# Patient Record
Sex: Female | Born: 1980
Health system: Southern US, Community
[De-identification: ages and names within clinical notes are randomized; demographics above are authoritative.]

## PROBLEM LIST (undated history)

## (undated) ENCOUNTER — Inpatient Hospital Stay: Payer: Self-pay

## (undated) DIAGNOSIS — F32A Depression, unspecified: Secondary | ICD-10-CM

## (undated) DIAGNOSIS — Z9889 Other specified postprocedural states: Secondary | ICD-10-CM

## (undated) DIAGNOSIS — T4145XA Adverse effect of unspecified anesthetic, initial encounter: Secondary | ICD-10-CM

## (undated) DIAGNOSIS — T8859XA Other complications of anesthesia, initial encounter: Secondary | ICD-10-CM

## (undated) DIAGNOSIS — J45909 Unspecified asthma, uncomplicated: Secondary | ICD-10-CM

## (undated) DIAGNOSIS — N915 Oligomenorrhea, unspecified: Secondary | ICD-10-CM

## (undated) DIAGNOSIS — F502 Bulimia nervosa: Secondary | ICD-10-CM

## (undated) DIAGNOSIS — L309 Dermatitis, unspecified: Secondary | ICD-10-CM

## (undated) DIAGNOSIS — R51 Headache: Secondary | ICD-10-CM

## (undated) DIAGNOSIS — O09892 Supervision of other high risk pregnancies, second trimester: Principal | ICD-10-CM

## (undated) DIAGNOSIS — D649 Anemia, unspecified: Secondary | ICD-10-CM

## (undated) DIAGNOSIS — R519 Headache, unspecified: Secondary | ICD-10-CM

## (undated) DIAGNOSIS — E282 Polycystic ovarian syndrome: Secondary | ICD-10-CM

## (undated) DIAGNOSIS — G500 Trigeminal neuralgia: Secondary | ICD-10-CM

## (undated) DIAGNOSIS — O339 Maternal care for disproportion, unspecified: Secondary | ICD-10-CM

## (undated) DIAGNOSIS — F909 Attention-deficit hyperactivity disorder, unspecified type: Secondary | ICD-10-CM

## (undated) DIAGNOSIS — E039 Hypothyroidism, unspecified: Secondary | ICD-10-CM

## (undated) DIAGNOSIS — R110 Nausea: Secondary | ICD-10-CM

## (undated) DIAGNOSIS — Z141 Cystic fibrosis carrier: Principal | ICD-10-CM

## (undated) DIAGNOSIS — R112 Nausea with vomiting, unspecified: Secondary | ICD-10-CM

## (undated) DIAGNOSIS — K219 Gastro-esophageal reflux disease without esophagitis: Secondary | ICD-10-CM

## (undated) DIAGNOSIS — J309 Allergic rhinitis, unspecified: Secondary | ICD-10-CM

## (undated) DIAGNOSIS — F329 Major depressive disorder, single episode, unspecified: Secondary | ICD-10-CM

## (undated) DIAGNOSIS — Z113 Encounter for screening for infections with a predominantly sexual mode of transmission: Secondary | ICD-10-CM

## (undated) DIAGNOSIS — Z1151 Encounter for screening for human papillomavirus (HPV): Secondary | ICD-10-CM

## (undated) DIAGNOSIS — N760 Acute vaginitis: Secondary | ICD-10-CM

## (undated) DIAGNOSIS — N63 Unspecified lump in unspecified breast: Secondary | ICD-10-CM

## (undated) DIAGNOSIS — Z01419 Encounter for gynecological examination (general) (routine) without abnormal findings: Secondary | ICD-10-CM

## (undated) HISTORY — DX: Hypothyroidism, unspecified: E03.9

## (undated) HISTORY — DX: Bulimia nervosa: F50.2

## (undated) HISTORY — DX: Maternal care for disproportion, unspecified: O33.9

## (undated) HISTORY — DX: Polycystic ovarian syndrome: E28.2

## (undated) HISTORY — DX: Oligomenorrhea, unspecified: N91.5

## (undated) HISTORY — PX: TONSILLECTOMY: SUR1361

## (undated) HISTORY — PX: WISDOM TOOTH EXTRACTION: SHX21

---

## 2003-05-26 ENCOUNTER — Emergency Department (HOSPITAL_COMMUNITY): Admission: EM | Admit: 2003-05-26 | Discharge: 2003-05-27 | Payer: Self-pay | Admitting: Emergency Medicine

## 2006-06-14 ENCOUNTER — Ambulatory Visit: Payer: Self-pay | Admitting: Family Medicine

## 2006-06-28 ENCOUNTER — Encounter: Payer: Self-pay | Admitting: Family Medicine

## 2006-06-28 ENCOUNTER — Ambulatory Visit: Payer: Self-pay | Admitting: Family Medicine

## 2006-06-28 ENCOUNTER — Other Ambulatory Visit: Admission: RE | Admit: 2006-06-28 | Discharge: 2006-06-28 | Payer: Self-pay | Admitting: Family Medicine

## 2006-06-28 LAB — CONVERTED CEMR LAB
HCV Ab: NEGATIVE
Pap Smear: NORMAL

## 2007-12-27 ENCOUNTER — Ambulatory Visit: Payer: Self-pay | Admitting: Family Medicine

## 2007-12-27 DIAGNOSIS — N63 Unspecified lump in unspecified breast: Secondary | ICD-10-CM | POA: Insufficient documentation

## 2007-12-27 DIAGNOSIS — N644 Mastodynia: Secondary | ICD-10-CM | POA: Insufficient documentation

## 2007-12-27 DIAGNOSIS — N6459 Other signs and symptoms in breast: Secondary | ICD-10-CM | POA: Insufficient documentation

## 2007-12-30 ENCOUNTER — Encounter: Admission: RE | Admit: 2007-12-30 | Discharge: 2007-12-30 | Payer: Self-pay | Admitting: Family Medicine

## 2007-12-31 ENCOUNTER — Telehealth: Payer: Self-pay | Admitting: Family Medicine

## 2007-12-31 LAB — CONVERTED CEMR LAB
Basophils Absolute: 0.1 10*3/uL (ref 0.0–0.1)
Basophils Relative: 1 % (ref 0–1)
Eosinophils Absolute: 0.2 10*3/uL (ref 0.0–0.7)
Eosinophils Relative: 2 % (ref 0–5)
HCT: 38.7 % (ref 36.0–46.0)
Hemoglobin: 13.5 g/dL (ref 12.0–15.0)
Lymphocytes Relative: 26 % (ref 12–46)
Lymphs Abs: 2.5 10*3/uL (ref 0.7–4.0)
MCHC: 34.9 g/dL (ref 30.0–36.0)
MCV: 87.4 fL (ref 78.0–100.0)
Monocytes Absolute: 0.5 10*3/uL (ref 0.1–1.0)
Monocytes Relative: 5 % (ref 3–12)
Neutro Abs: 6.4 10*3/uL (ref 1.7–7.7)
Neutrophils Relative %: 67 % (ref 43–77)
Platelets: 310 10*3/uL (ref 150–400)
Prolactin: 43.4 ng/mL
RBC: 4.43 M/uL (ref 3.87–5.11)
RDW: 12.1 % (ref 11.5–15.5)
TSH: 4.511 microintl units/mL — ABNORMAL HIGH (ref 0.350–4.50)
WBC: 9.7 10*3/uL (ref 4.0–10.5)
hCG, Beta Chain, Quant, S: <2 milliintl units/mL

## 2008-01-17 ENCOUNTER — Encounter: Payer: Self-pay | Admitting: Family Medicine

## 2008-01-17 DIAGNOSIS — F502 Bulimia nervosa, unspecified: Secondary | ICD-10-CM

## 2008-01-17 DIAGNOSIS — F3289 Other specified depressive episodes: Secondary | ICD-10-CM | POA: Insufficient documentation

## 2008-01-17 DIAGNOSIS — J309 Allergic rhinitis, unspecified: Secondary | ICD-10-CM | POA: Insufficient documentation

## 2008-01-17 DIAGNOSIS — J45909 Unspecified asthma, uncomplicated: Secondary | ICD-10-CM | POA: Insufficient documentation

## 2008-01-17 DIAGNOSIS — F329 Major depressive disorder, single episode, unspecified: Secondary | ICD-10-CM | POA: Insufficient documentation

## 2008-01-17 DIAGNOSIS — L259 Unspecified contact dermatitis, unspecified cause: Secondary | ICD-10-CM | POA: Insufficient documentation

## 2008-01-17 DIAGNOSIS — G43009 Migraine without aura, not intractable, without status migrainosus: Secondary | ICD-10-CM | POA: Insufficient documentation

## 2008-01-17 HISTORY — DX: Bulimia nervosa: F50.2

## 2008-01-17 HISTORY — DX: Bulimia nervosa, unspecified: F50.20

## 2010-10-14 NOTE — Assessment & Plan Note (Signed)
[facility]                           [facility] OFFICE NOTE   NAME:Paula [name]                     MRN:          [id number]  DATE:06/14/2006                            DOB:          Aug 24, 1980    CHIEF COMPLAINT:  A 30 year old female here to establish new doctor.   HISTORY OF PRESENT ILLNESS:  [name] was encouraged by her friend  who is also my patient to come into clinic to get routine prevention up  to date. She is due for her Pap smear. She also has the following  concerns:  1. Birth control options:  She was on Yasmin in the past and noticed      some blood clots, but has not been on this in over a year. She is      interested in restarting a type of birth control.  2. Chronic congestion: She states that off and on for the past 2 years      she has had problems with congestion, intermittent facial      tenderness, sneezing, mucous, and occasionally chills. No fever.      She has some worsening of symptoms over the past 2 weeks. She      denies cough, shortness of breath.  3. Frequent mood swings and anger problems: She states that her mood      swings very quickly within the day, she can be happy and then be      very angry. She has difficulty managing her anger and is very      irritable. She denies sad feelings. She does have a family history      that is fairly strong for depression and bipolar disorder. She has      never been evaluated for these. She denies suicidal or homicidal      ideation.   REVIEW OF SYSTEMS:  Otherwise negative.   PAST MEDICAL HISTORY:  1. Asthma, mild, intermittent.  2. Depression.  3. Bulimia at age 38, now resolved.  4. Eczema.  5. Allergic rhinitis.  6. Migraines.  7. Herpes simplex type 1.   HOSPITALIZATIONS, SURGERIES, PROCEDURES:  1. 2001, barium swallow negative.  2. 1992, tonsillectomy.  3. August 2004 ovarian cyst rupture.  4. Pap smear negative in 2006.   ALLERGIES:  TO IV DYE  CAUSING DIFFICULTY BREATHING.   MEDICATIONS:  Multivitamin daily.   SOCIAL HISTORY:  No tobacco use, occasional alcohol 3 to 4 drinks every  few weeks. No drug use. 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 She is single currently but has had multiple sex  partners. She is not currently sexually active but does use condoms when  she is. She has a history of herpes and Chlamydia. She walks at work but  does not get any regular exercise. She eats frequent salads but not much  fruit and does eat vegetables.   FAMILY HISTORY:  Father deceased at age 29 from pneumonia, mother alive  at age 71 with fibromyalgia, bipolar disorder, and depression. She does  not have a good relationship with  her mother. She was raised by her  grandparents. Paternal grandmother had a heart attack and a stroke  before age 45. She has 1 brother who has PTSD and bipolar disorder. Her  maternal grandmother possibly plus other family members have diabetes.  Great grandmother had colon cancer.   PHYSICAL EXAMINATION:  VITAL SIGNS:  Height 58 inches, weight 117, blood  pressure 128/82, pulse 80, temperature 98.2.  GENERAL: Healthy-appearing female in no apparent distress.  PSYCH: Appropriate affect; no suicidal, homicidal ideation. Clear  thinking.  HEENT: PERRLA: Extraocular muscles intact.  Oropharynx clear.  Tympanic  membranes clear. Nares clear but with turbinates, pale. No thyromegaly.  No lymphadenopathy, supraclavicular, cervical. No sinus tenderness.  LUNGS: Clear to auscultation bilaterally; no wheezes, rales, or rhonchi.  ABDOMEN: Soft, nontender, normoactive bowel sounds, no  hepatosplenomegaly.  MUSCULOSKELETAL: Strength 5/5 in upper and lower extremities.  NEURO: Cranial nerves II-XII grossly intact, alert and oriented x3.   ASSESSMENT/PLAN:  1. Multiple types of birth control where discussed with the patient in      detail. She will begin on Seasonale which will be called in  via Dr.      Clinton Gallant.  2. Allergic rhinitis, chronic: She states that she has not tolerated      nasal sprays in the past. She wants to avoid that if possible. She      was given 3 samples of Allegra D which she will use over the next      few days and then we will change over to a prescription for      fexofenadine 1 tablet daily.  3. Mood swings and anger management problems: We did not have time to      discuss this in detail today. At her next visit we can do a      depression and bipolar screen as well as make a referal for anger      management and stress management.  4. Prevention:  She is due for Pap smear which she will return for.      She is up to date with vaccines. We discussed healthy eating habits      and how to increase exercise. I will obtain records from her      previous doctors. She was also encouraged to take calcium and      vitamin D along with multivitamin daily.     Kerby Nora, MD  Electronically Signed    AB/MedQ  DD: 06/14/2006  DT: 06/14/2006  Job #: [id number]

## 2011-01-03 ENCOUNTER — Other Ambulatory Visit: Payer: Self-pay

## 2011-01-26 ENCOUNTER — Ambulatory Visit: Payer: Self-pay | Admitting: Vascular Surgery

## 2011-03-01 ENCOUNTER — Other Ambulatory Visit: Payer: Self-pay | Admitting: Obstetrics and Gynecology

## 2011-04-21 ENCOUNTER — Other Ambulatory Visit: Payer: Self-pay | Admitting: Emergency Medicine

## 2011-06-27 ENCOUNTER — Other Ambulatory Visit: Payer: Self-pay

## 2011-06-27 LAB — TSH: Thyroid Stimulating Horm: 4.58 u[IU]/mL — ABNORMAL HIGH

## 2011-07-13 ENCOUNTER — Emergency Department: Payer: Self-pay | Admitting: Emergency Medicine

## 2011-07-13 LAB — URINALYSIS, COMPLETE
Bacteria: NONE SEEN
Bilirubin,UR: NEGATIVE
Blood: NEGATIVE
Glucose,UR: NEGATIVE mg/dL (ref 0–75)
Leukocyte Esterase: NEGATIVE
Nitrite: NEGATIVE
Ph: 6 (ref 4.5–8.0)
Protein: NEGATIVE
RBC,UR: 1 /HPF (ref 0–5)
Specific Gravity: 1.025 (ref 1.003–1.030)
Squamous Epithelial: 1
WBC UR: 1 /HPF (ref 0–5)

## 2011-07-13 LAB — RAPID INFLUENZA A&B ANTIGENS

## 2011-07-16 LAB — BETA STREP CULTURE(ARMC)

## 2011-08-02 ENCOUNTER — Other Ambulatory Visit: Payer: Self-pay | Admitting: Obstetrics and Gynecology

## 2011-08-02 LAB — PREGNANCY, URINE: Pregnancy Test, Urine: NEGATIVE m[IU]/mL

## 2011-09-25 ENCOUNTER — Other Ambulatory Visit: Payer: Self-pay

## 2011-09-25 LAB — TSH: Thyroid Stimulating Horm: 0.8 u[IU]/mL

## 2011-09-25 LAB — LIPID PANEL
Cholesterol: 170 mg/dL (ref 0–200)
HDL Cholesterol: 40 mg/dL (ref 40–60)
Ldl Cholesterol, Calc: 113 mg/dL — ABNORMAL HIGH (ref 0–100)
Triglycerides: 85 mg/dL (ref 0–200)
VLDL Cholesterol, Calc: 17 mg/dL (ref 5–40)

## 2011-10-21 LAB — VAGINITIS DNA PROBE
Direct Exam: NEGATIVE
Direct Exam: NEGATIVE
Direct Exam: NEGATIVE

## 2011-10-21 LAB — MICROSCOPIC URINALYSIS
RBC, UA: 0 /HPF
WBC, UA: 50 /HPF

## 2011-10-21 LAB — UA W/REFLEX CULTURE
Bilirubin Urine: NEGATIVE
Glucose, Ur: NEGATIVE
Ketones, Urine: NEGATIVE
Nitrite, Urine: NEGATIVE
Protein, UA: NEGATIVE
Specific Gravity, UA: 1.01 (ref 1.000–1.030)
Urine Hgb: NEGATIVE
Urobilinogen, Urine: NORMAL
pH, UA: 6 (ref 5.0–8.0)

## 2011-10-21 LAB — PREGNANCY, URINE: HCG(Urine) Pregnancy Test: NEGATIVE

## 2011-10-22 LAB — URINE CULTURE CLEAN CATCH

## 2011-10-22 LAB — C. TRACHOMATIS / N. GONORRHOEAE, DNA
C. trachomatis DNA: NEGATIVE
N. gonorrhoeae DNA: NEGATIVE

## 2012-01-08 ENCOUNTER — Other Ambulatory Visit: Payer: Self-pay | Admitting: General Practice

## 2012-01-08 LAB — TSH: Thyroid Stimulating Horm: 1.54 u[IU]/mL

## 2012-02-26 ENCOUNTER — Other Ambulatory Visit: Payer: Self-pay

## 2012-02-26 LAB — T4, FREE: Free Thyroxine: 0.69 ng/dL — ABNORMAL LOW (ref 0.76–1.46)

## 2012-02-26 LAB — TSH: Thyroid Stimulating Horm: 0.628 u[IU]/mL

## 2012-04-12 ENCOUNTER — Other Ambulatory Visit: Payer: Self-pay | Admitting: Obstetrics and Gynecology

## 2012-06-10 ENCOUNTER — Other Ambulatory Visit: Payer: Self-pay | Admitting: Obstetrics and Gynecology

## 2013-03-28 ENCOUNTER — Observation Stay: Payer: Self-pay | Admitting: Obstetrics and Gynecology

## 2013-03-28 LAB — URINALYSIS, COMPLETE
Bilirubin,UR: NEGATIVE
Blood: NEGATIVE
Glucose,UR: NEGATIVE mg/dL (ref 0–75)
Leukocyte Esterase: NEGATIVE
Nitrite: NEGATIVE
Ph: 5 (ref 4.5–8.0)
Protein: 30
RBC,UR: 4 /HPF (ref 0–5)
Specific Gravity: 1.027 (ref 1.003–1.030)
Squamous Epithelial: 1
WBC UR: 4 /HPF (ref 0–5)

## 2013-04-19 ENCOUNTER — Inpatient Hospital Stay: Payer: Self-pay | Admitting: Obstetrics and Gynecology

## 2013-04-19 LAB — CBC WITH DIFFERENTIAL/PLATELET
Basophil #: 0 10*3/uL (ref 0.0–0.1)
Basophil %: 0.3 %
Eosinophil #: 0 10*3/uL (ref 0.0–0.7)
Eosinophil %: 0.3 %
HCT: 32 % — ABNORMAL LOW (ref 35.0–47.0)
HGB: 10.7 g/dL — ABNORMAL LOW (ref 12.0–16.0)
Lymphocyte #: 1.5 10*3/uL (ref 1.0–3.6)
Lymphocyte %: 9.7 %
MCH: 28.7 pg (ref 26.0–34.0)
MCHC: 33.5 g/dL (ref 32.0–36.0)
MCV: 86 fL (ref 80–100)
Monocyte #: 0.7 x10 3/mm (ref 0.2–0.9)
Monocyte %: 4.8 %
Neutrophil #: 12.8 10*3/uL — ABNORMAL HIGH (ref 1.4–6.5)
Neutrophil %: 84.9 %
Platelet: 237 10*3/uL (ref 150–440)
RBC: 3.73 10*6/uL — ABNORMAL LOW (ref 3.80–5.20)
RDW: 13.5 % (ref 11.5–14.5)
WBC: 15 10*3/uL — ABNORMAL HIGH (ref 3.6–11.0)

## 2013-04-20 LAB — HEMATOCRIT: HCT: 28.8 % — ABNORMAL LOW (ref 35.0–47.0)

## 2013-06-03 ENCOUNTER — Other Ambulatory Visit: Payer: Self-pay | Admitting: Obstetrics and Gynecology

## 2013-06-03 LAB — RENAL FUNCTION PANEL
Albumin: 3.6 g/dL (ref 3.4–5.0)
Anion Gap: 4 — ABNORMAL LOW (ref 7–16)
BUN: 13 mg/dL (ref 7–18)
Calcium, Total: 8.9 mg/dL (ref 8.5–10.1)
Chloride: 104 mmol/L (ref 98–107)
Co2: 28 mmol/L (ref 21–32)
Creatinine: 0.72 mg/dL (ref 0.60–1.30)
EGFR (African American): >60
EGFR (Non-African Amer.): >60
Glucose: 72 mg/dL (ref 65–99)
Osmolality: 271 (ref 275–301)
Phosphorus: 3.6 mg/dL (ref 2.5–4.9)
Potassium: 3.9 mmol/L (ref 3.5–5.1)
Sodium: 136 mmol/L (ref 136–145)

## 2013-06-03 LAB — CBC WITH DIFFERENTIAL/PLATELET
Basophil #: 0.1 10*3/uL (ref 0.0–0.1)
Basophil %: 1.1 %
Eosinophil #: 0.2 10*3/uL (ref 0.0–0.7)
Eosinophil %: 3.2 %
HCT: 39.5 % (ref 35.0–47.0)
HGB: 13.3 g/dL (ref 12.0–16.0)
Lymphocyte #: 2 10*3/uL (ref 1.0–3.6)
Lymphocyte %: 34.6 %
MCH: 27.7 pg (ref 26.0–34.0)
MCHC: 33.6 g/dL (ref 32.0–36.0)
MCV: 83 fL (ref 80–100)
Monocyte #: 0.3 x10 3/mm (ref 0.2–0.9)
Monocyte %: 4.8 %
Neutrophil #: 3.2 10*3/uL (ref 1.4–6.5)
Neutrophil %: 56.3 %
Platelet: 264 10*3/uL (ref 150–440)
RBC: 4.79 10*6/uL (ref 3.80–5.20)
RDW: 14.3 % (ref 11.5–14.5)
WBC: 5.7 10*3/uL (ref 3.6–11.0)

## 2013-06-03 LAB — TSH: Thyroid Stimulating Horm: 1.14 u[IU]/mL

## 2013-08-15 NOTE — Progress Notes (Signed)
Patient wasn't able to receive pap due to being on her period during her visit.  This encounter was created in error - please disregard.

## 2013-08-19 MED ORDER — METRONIDAZOLE 500 MG PO TABS
500 MG | ORAL_TABLET | Freq: Two times a day (BID) | ORAL | Status: AC
Start: 2013-08-19 — End: 2013-08-26

## 2013-08-19 NOTE — Progress Notes (Signed)
Subjective:      Patient ID: Olivia Castro is a 33 y.o. female.  Chief Complaint   Patient presents with   ??? Gynecologic Exam     BP 124/78    Ht 5\' 4"  (1.626 m)    Wt 149 lb (67.586 kg)    BMI 25.56 kg/m2      LMP 08/10/2013      Breastfeeding? No     Patient's last menstrual period was 08/10/2013.    [id number]    Not found.  History reviewed. No pertinent past medical history.  No current Epic-ordered outpatient prescriptions on file.     No current Epic-ordered facility-administered medications on file.     Problem List Items Addressed This Visit     None      Visit Diagnoses    Pap smear for cervical cancer screening    -  Primary         No Known Allergies  No orders of the defined types were placed in this encounter.             HPI Comments: Patient is here for her annual pap smear. She denies being in any pain. She currently is not on any type of birth control and doesn't want any.    Gynecologic Exam  The patient's pertinent negatives include no pelvic pain. Pertinent negatives include no abdominal pain, constipation, diarrhea or dysuria.   denies any bleeding or fever at this time    Review of Systems   Constitutional: Negative for activity change and appetite change.   HENT: Negative for ear discharge.    Eyes: Negative for visual disturbance.   Respiratory: Negative for apnea and cough.    Cardiovascular: Negative for chest pain, palpitations and leg swelling.   Gastrointestinal: Negative for abdominal pain, diarrhea, constipation and abdominal distention.   Genitourinary: Negative for dysuria, difficulty urinating, menstrual problem and pelvic pain.   Musculoskeletal: Negative for neck pain and neck stiffness.   Neurological: Negative for light-headedness and numbness.   Hematological: Negative.  Does not bruise/bleed easily.         Objective:   Physical Exam   Constitutional: She is oriented to person, place, and time. She appears well-developed and well-nourished.   HENT:   Head: Normocephalic  and atraumatic.   Neck: Normal range of motion. Neck supple. No thyromegaly present.   Cardiovascular: Normal rate and regular rhythm.    Pulmonary/Chest: Effort normal and breath sounds normal. She has no wheezes.   Abdominal: Soft. Bowel sounds are normal. She exhibits no distension and no mass. There is no tenderness. There is no guarding. Hernia confirmed negative in the right inguinal area and confirmed negative in the left inguinal area.   Genitourinary: Rectum normal and uterus normal. No breast swelling, tenderness, discharge or bleeding. There is no rash or tenderness on the right labia. There is no rash or tenderness on the left labia. Cervix exhibits discharge. Cervix exhibits no motion tenderness and no friability. Right adnexum displays no mass, no tenderness and no fullness. Left adnexum displays no mass, no tenderness and no fullness. Vaginal discharge found.   Musculoskeletal: Normal range of motion.   Lymphadenopathy:        Right: No inguinal adenopathy present.        Left: No inguinal adenopathy present.   Neurological: She is alert and oriented to person, place, and time.   Skin: Skin is dry.   Psychiatric: She has a normal mood and affect.  Her behavior is normal. Thought content normal.   Nursing note and vitals reviewed.        Assessment:      Pt with vaginitis otherwise normal annual exam      Plan:      Orders Placed This Encounter   Procedures   ??? PAP SMEAR     Orders Placed This Encounter   Medications   ??? metroNIDAZOLE (FLAGYL) 500 MG tablet     Sig: Take 1 tablet by mouth 2 times daily for 7 days.     Dispense:  14 tablet     Refill:  0

## 2013-12-18 LAB — TSH: TSH: 1.29 u[IU]/mL (ref <=5.90)

## 2013-12-18 LAB — HM PAP SMEAR: HM Pap smear: NEGATIVE

## 2014-02-24 NOTE — Progress Notes (Signed)
Subjective:      Patient ID: Olivia Castro is a [age] y.o. female.  Chief Complaint   Patient presents with   ??? Breast Mass     right      BP 116/68 mmHg   Pulse 84   Resp 14   Ht 5\' 4"  (1.626 m)   Wt 159 lb (72.122 kg)   BMI 27.28 kg/m2   LMP 01/27/2014 (Within Weeks)   Breastfeeding? No  Patient's last menstrual period was 01/27/2014 (within weeks).    [id number]    No past medical history on file.  No current Epic-ordered outpatient prescriptions on file.     No current Epic-ordered facility-administered medications on file.     Problem List Items Addressed This Visit     None        No Known Allergies  No orders of the defined types were placed in this encounter.           HPI Comments: Pt found a lump on her right breast.  Pt says that it's been there for approx a month.  This lump is causing her pain rated a 2.  Pt says that her breast is tender.  No medication taken for pain.  Pt says there is not a history of breast cancer in her family that she is aware of.    Denies fever, nausea or vomiting associated to breast lump  Review of Systems   Constitutional: Negative for activity change and appetite change.   HENT: Negative for ear discharge.    Eyes: Negative for visual disturbance.   Respiratory: Negative for apnea and cough.    Cardiovascular: Negative for chest pain, palpitations and leg swelling.   Gastrointestinal: Negative for abdominal pain, diarrhea, constipation and abdominal distention.   Genitourinary: Negative for dysuria, difficulty urinating, menstrual problem and pelvic pain.   Musculoskeletal: Negative for neck pain and neck stiffness.   Neurological: Negative for light-headedness and numbness.   Hematological: Negative.  Does not bruise/bleed easily.         Objective:   Physical Exam   Constitutional: She is oriented to person, place, and time. Vital signs are normal. She appears well-developed and well-nourished.   HENT:   Head: Normocephalic and atraumatic.   Neck: Normal range of motion.  Neck supple. No thyromegaly present.   Cardiovascular: Normal rate and regular rhythm.    Pulmonary/Chest: Effort normal and breath sounds normal. She has no wheezes. Right breast exhibits mass and tenderness. Right breast exhibits no inverted nipple, no nipple discharge and no skin change. Left breast exhibits no inverted nipple, no mass, no nipple discharge, no skin change and no tenderness. Breasts are symmetrical.   Abdominal: Soft. Bowel sounds are normal. She exhibits no distension and no mass. There is no tenderness. There is no guarding.   Genitourinary: Uterus normal.   Musculoskeletal: Normal range of motion.   Neurological: She is alert and oriented to person, place, and time.   Skin: Skin is dry.   Psychiatric: She has a normal mood and affect. Her behavior is normal. Thought content normal.   Nursing note and vitals reviewed.        Assessment:      Pt with rt breast mass       Plan:      Orders Placed This Encounter   Procedures   ??? Ultrasound breast bilateral

## 2014-02-27 ENCOUNTER — Telehealth

## 2014-02-27 NOTE — Telephone Encounter (Signed)
Patient [facility] bilateral [facility], and radiologist is requesting a bilateral mammogram order for further care.

## 2014-03-13 NOTE — Telephone Encounter (Signed)
-----   Message from Erline Levinelaudio E Linares, MD sent at 03/10/2014  4:58 PM EDT -----  Have pt see me

## 2014-03-16 NOTE — Telephone Encounter (Signed)
-----   Message from Claudio E Linares, MD sent at 03/10/2014  4:58 PM EDT -----  Have pt see me

## 2014-03-23 NOTE — Telephone Encounter (Signed)
Dr Lucita Loralinares wants to see pt

## 2014-03-23 NOTE — Telephone Encounter (Signed)
Previous encounter opened for this contact

## 2014-04-01 NOTE — Telephone Encounter (Signed)
Dr Lucita Loralinares wants to see pt she was a n/s for her follow up appt

## 2014-05-04 NOTE — Telephone Encounter (Signed)
Pt needs appt to discuss mammogram

## 2014-05-04 NOTE — Telephone Encounter (Signed)
-----   Message from Claudio E Linares, MD sent at 03/10/2014  4:58 PM EDT -----  Have pt see me

## 2014-05-28 NOTE — Telephone Encounter (Signed)
Addressing in new encounter

## 2014-05-28 NOTE — Telephone Encounter (Signed)
Addressing in new encounter

## 2014-05-29 DIAGNOSIS — Z141 Cystic fibrosis carrier: Secondary | ICD-10-CM

## 2014-05-29 DIAGNOSIS — O09892 Supervision of other high risk pregnancies, second trimester: Secondary | ICD-10-CM

## 2014-05-29 HISTORY — DX: Supervision of other high risk pregnancies, second trimester: O09.892

## 2014-05-29 HISTORY — DX: Cystic fibrosis carrier: Z14.1

## 2014-06-24 NOTE — Telephone Encounter (Signed)
L/M for patient to call office back re: her mammogram .

## 2014-08-18 NOTE — Telephone Encounter (Signed)
Unable to contact pt

## 2014-09-03 ENCOUNTER — Other Ambulatory Visit
Admit: 2014-09-03 | Disposition: A | Discharge status: Home / Self Care | Payer: Self-pay | Attending: Otolaryngology | Admitting: Otolaryngology

## 2014-09-03 LAB — TSH: Thyroid Stimulating Horm: 2.79 u[IU]/mL

## 2014-09-03 LAB — T4, FREE: Free Thyroxine: 0.75 ng/dL

## 2014-09-18 NOTE — Op Note (Signed)
PATIENT NAME:  Paula [name], Paula [name] MR#:  [id number] DATE OF BIRTH:  [birth year]  DATE OF PROCEDURE:  04/19/2013  PREOPERATIVE DIAGNOSES: 1.  A 38.5 week intrauterine pregnancy, delivered.  2.  Suspected CPD.   POSTOPERATIVE DIAGNOSES: 1.  A 38.5 week intrauterine pregnancy, delivered.  2.  Suspected CPD.  3.  Nuchal cord x 1.  4.  Viable female infant, 7 pounds, 6 ounces.   OPERATIVE PROCEDURE: Primary low cervical transverse cesarean section.   SURGEON: Herold HarmsMartin A Shanisha Lech, MD   FIRST ASSISTANT:  Maryelizabeth RowanSarah Denon,  PA-S  ANESTHESIA: Spinal.   INDICATIONS: The patient is a 34 year old white female, gravida 1, para zero, at 38.[redacted] weeks gestation, who presents in labor. The patient has small stature and a clinically suspect pelvis with clinical CPD.  Fetal head was not descended into the pelvis. Due to the regularity of the contractions, effacement without dilation or engagement of the fetal head, decision was made to proceed with cesarean section delivery.   Findings at surgery revealed a viable female infant, 7 pounds, 6 ounces, having Apgars of 8 and 9 at one and five minutes, respectively. The infant had a nuchal cord x 1 which was reduced. Uterus, tubes, and ovaries were grossly normal.   DESCRIPTION OF PROCEDURE: The patient was brought to the Operating Room, where she was placed in a sitting position. Spinal anesthetic was introduced without difficulty. She was placed in the supine position with a right lateral hip roll placed. A ChloraPrep abdominal and perineal prep and drape was performed in standard fashion. A Foley catheter was placed, which was draining clear yellow urine. After checking for adequate level of anesthesia, a Pfannenstiel incision was made into the abdomen. Fascia was incised transversely and extended bilaterally with Mayo scissors. Midline raphe was incised, separated, and the peritoneum was entered. Bladder flap was created through sharp dissection. A transverse incision  was made in the uterus and this was extended both cephalad and caudad through standard technique. The infant was delivered through a vertex presentation, and was vigorous at birth. Nuchal cord x 1 was reduced. Oropharynx and nasopharynx were bulb suctioned on the abdominal wall. The infant was delivered in standard fashion. The umbilical cord was doubly clamped and cut, and the infant was handed off to the awaiting resuscitation team. Cord blood sampling was obtained. Placenta was expressed from the uterine cavity. The uterus was externalized onto the anterior abdominal wall, and was cleared of all debris with laps. The incision was closed in 2 layers using #1 chromic suture. First layer was a running locking stitch, second layer was an imbricating layer. Anatomy was noted to be normal, and the uterus was placed back into the abdominopelvic cavity. Gutters were cleared of all debris with laps. The incision was closed in 2 layers using 0 Maxon on the fascia in a simple running manner. The skin was closed with subcuticular stitch of 4-0 Vicryl. Steri-Strips were placed over the incision. The patient was then mobilized, and taken to the recovery room in satisfactory condition.   Estimated blood loss was 500 mL. Intravenous fluids were 1200 mL of crystalloid. Urine output was 150 mL. All instrument, needle, and sponge counts were verified as correct. The patient did receive Ancef antibiotic prophylaxis.     ____________________________ Prentice DockerMartin A. Marlys Stegmaier, MD mad:mr D: 04/19/2013 19:07:47 ET T: 04/19/2013 21:45:44 ET JOB#: [id number]  cc: Daphine DeutscherMartin A. Suni Jarnagin, MD, <Dictator> Prentice DockerMARTIN A Tykira Wachs MD ELECTRONICALLY SIGNED 05/01/2013 7:40

## 2014-10-06 NOTE — H&P (Signed)
L&D Evaluation:  History:  HPI 34 yo G1P0 at 5938.[redacted] weeks gestation, estimated date of confinement 04/28/2013 admitted in Labor. Pt with clinical CPD. C/S desired.   Presents with contractions, decreased fetal movement, nausea/vomiting   Patient's Medical History Short Stature; Hypothyroidism   Patient's Surgical History none   Medications Pre [place]atal Vitamins  Levothyroxin 75mcg   Allergies Shellfish, Iodine   Social History none   Family History Non-Contributory   ROS:  General normal   HEENT normal   CNS normal   GI nausea   GU normal   Resp normal   CV normal   Renal normal   MS normal   Exam:  Vital Signs stable   Urine Protein not completed   General Uncomfortable with contyractions   Mental Status clear   Chest clear   Heart normal sinus rhythm   Abdomen gravid, tender with contractions   Estimated Fetal Weight Average for gestational age   Fetal Position Vtx   Back no CVAT   Edema 1+   Pelvic no external lesions, 3/80/-4/posterior/vtx   Mebranes Intact   FHT normal rate with no decels   Ucx regular   Ucx Frequency 4 min   Ucx Pain Scale moderate   Skin dry   Lymph no lymphadenopathy   Other O+/ATB-/NR/RI/VI/HB-/HIV-GBS-/glucola 120/TSH 3.13 (02/27/13)   Impression:  Impression early labor, decreased fetal movement, Nausea; Clinical CPD   Plan:  Plan Primary Low Transverse Cesarean Section   Comments Counseling: Pt understands the recommendation for C/S delivery. Her short stature and clinical pelvimetry(marginal), lack of fetal head descent into pelvis contributes to her CPD diagnosis. Strong contraction over past 24 hours have led to some effacement but no cervical change or engagement of fetal head in pelvis. Pt is acceptong of all risks which include but are not limited to bleeding/infection/pelvioc organ injury with need for repair/fetal injury/blood clot disorders/ anesthesi risks/etc. All questions are answered.  Informed consent is given.   Electronic Signatures: Kevin Space, Prentice DockerMartin A (MD)  (Signed [id number]-Nov-14 16:37)  Authored: L&D Evaluation   Last Updated: 22-Nov-14 16:37 by Adamae Ricklefs, Prentice DockerMartin A (MD)

## 2014-12-22 ENCOUNTER — Encounter: Payer: Self-pay | Admitting: Obstetrics and Gynecology

## 2014-12-22 DIAGNOSIS — E039 Hypothyroidism, unspecified: Secondary | ICD-10-CM | POA: Insufficient documentation

## 2014-12-22 DIAGNOSIS — N915 Oligomenorrhea, unspecified: Secondary | ICD-10-CM | POA: Insufficient documentation

## 2014-12-22 DIAGNOSIS — O339 Maternal care for disproportion, unspecified: Secondary | ICD-10-CM | POA: Insufficient documentation

## 2014-12-22 DIAGNOSIS — E282 Polycystic ovarian syndrome: Secondary | ICD-10-CM | POA: Insufficient documentation

## 2014-12-22 DIAGNOSIS — E038 Other specified hypothyroidism: Secondary | ICD-10-CM

## 2014-12-22 HISTORY — DX: Maternal care for disproportion, unspecified: O33.9

## 2014-12-22 HISTORY — DX: Oligomenorrhea, unspecified: N91.5

## 2014-12-24 ENCOUNTER — Encounter: Payer: Self-pay | Admitting: Obstetrics and Gynecology

## 2015-02-23 ENCOUNTER — Ambulatory Visit (INDEPENDENT_AMBULATORY_CARE_PROVIDER_SITE_OTHER): Payer: 59 | Admitting: Obstetrics and Gynecology

## 2015-02-23 ENCOUNTER — Encounter: Payer: Self-pay | Admitting: Obstetrics and Gynecology

## 2015-02-23 VITALS — BP 126/78 | HR 60 | Ht <= 58 in | Wt 142.4 lb

## 2015-02-23 DIAGNOSIS — Z36 Encounter for antenatal screening of mother: Secondary | ICD-10-CM

## 2015-02-23 DIAGNOSIS — N926 Irregular menstruation, unspecified: Secondary | ICD-10-CM | POA: Diagnosis not present

## 2015-02-23 DIAGNOSIS — Z3687 Encounter for antenatal screening for uncertain dates: Secondary | ICD-10-CM

## 2015-02-23 LAB — POCT URINE PREGNANCY: Preg Test, Ur: POSITIVE — AB

## 2015-02-23 MED ORDER — LEVOTHYROXINE SODIUM 75 MCG PO TABS: 75.0000 ug | ORAL_TABLET | Freq: Every day | ORAL | Status: DC

## 2015-02-23 NOTE — Progress Notes (Signed)
GYNECOLOGY CLINIC PROGRESS NOTE  Subjective:    [name] is a 34 y.o. P74 female. Paula [name] reports delayed menses and thinks she could be pregnant. Patient's last menstrual period was 01/16/2015 (approximate). Patient has a h/o PCOS, hypothyrodism.  Did not think that she could become pregnant again after last child as prior efforts had failed.  Is not currently using any form of contraception. She is not in acute distress.  Had two positive UPT's at home today.   The following portions of the patient's history were reviewed and updated as appropriate: allergies, current medications, past family history, past medical history, past social history, past surgical history and problem list.  Review of Systems A comprehensive review of systems was negative except for: Genitourinary: positive for absent menses Integument/breast: positive for breast tenderness   Objective:     BP 126/78 mmHg  Pulse 60  Ht  (1.473 m)  Wt 142 lb 6.4 oz (64.592 kg)  BMI 29.77 kg/m2  LMP 01/16/2015 (Approximate)  Breastfeeding? No General Appearance: No acute distress Exam deferred.   Urine UPT: positive test in office today  Assessment:  Absent menstruation  Plan:   Pregnancy Test: Positive: EDC: 5//28/2017, EGA 5.2 weeks. . Briefly discussed pre-natal care options. Encouraged well-balanced diet, plenty of rest when needed, pre-natal vitamins daily and walking for exercise. Discussed self-help for nausea, avoiding OTC medications until consulting [facility] or pharmacist, other than Tylenol as needed, minimal caffeine (1-2 cups daily) and avoiding alcohol. She will schedule her initial OB interview in the next month one of our nurses. Feel free to call with any questions. Will schedule for viability scan in 1 week.    Will discuss continued medical management of thyroid disease at NOB visit.    Hildred Laser, MD [facility]

## 2015-03-01 ENCOUNTER — Telehealth: Payer: Self-pay | Admitting: Obstetrics and Gynecology

## 2015-03-01 NOTE — Telephone Encounter (Signed)
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 She is only 5 wks and we told her she needed to be in the second trimester. She needs a note saying that she is exempt until her 2nd trimester. I will fax it if t=yiu can do the note AI have the fax number and her bosses name to fax it to. Thanks!!!

## 2015-03-02 NOTE — Telephone Encounter (Signed)
Letter printed and given to RF to Fax.

## 2015-03-03 ENCOUNTER — Telehealth: Payer: Self-pay

## 2015-03-03 NOTE — Telephone Encounter (Signed)
Triage message from last night states pt had hives/allergies and wanted to know if she could take Zyrtec or Benadryl and triage was unable to reach her. I also tried and let message that we were checking up on her but got only voice mail. Pt has appt tomorrow.

## 2015-03-04 ENCOUNTER — Ambulatory Visit: Payer: 59

## 2015-03-04 DIAGNOSIS — Z3687 Encounter for antenatal screening for uncertain dates: Secondary | ICD-10-CM

## 2015-03-04 DIAGNOSIS — Z36 Encounter for antenatal screening of mother: Secondary | ICD-10-CM | POA: Diagnosis not present

## 2015-03-17 ENCOUNTER — Encounter: Payer: Self-pay | Admitting: Obstetrics and Gynecology

## 2015-03-25 ENCOUNTER — Telehealth: Payer: Self-pay | Admitting: Obstetrics and Gynecology

## 2015-03-25 NOTE — Telephone Encounter (Signed)
Pt called earlier this am and she states she has sharp pains on left side and has had some light pink spotting when wiped yesterday am and 2 more time later wiped with slimmy pinkish discharge. Has not been sexually active since the first of last week. 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 Pt states he said that something was above the sac (?questionable subchorionic hemorrhage) and FHR: 158. Pt also states that there last child was carrier of cystic fibrosis and they both need to be tested. Pt to continue to observe vaginal spotting and if increases/bright red blood to contact office or ED. Will order CF test on nurse OB intake.

## 2015-03-25 NOTE — Telephone Encounter (Signed)
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

## 2015-03-30 ENCOUNTER — Ambulatory Visit (INDEPENDENT_AMBULATORY_CARE_PROVIDER_SITE_OTHER): Payer: 59 | Admitting: Obstetrics and Gynecology

## 2015-03-30 VITALS — BP 121/78 | HR 62 | Wt 146.5 lb

## 2015-03-30 DIAGNOSIS — Z3490 Encounter for supervision of normal pregnancy, unspecified, unspecified trimester: Secondary | ICD-10-CM

## 2015-03-30 DIAGNOSIS — R638 Other symptoms and signs concerning food and fluid intake: Secondary | ICD-10-CM

## 2015-03-30 DIAGNOSIS — Z113 Encounter for screening for infections with a predominantly sexual mode of transmission: Secondary | ICD-10-CM

## 2015-03-30 DIAGNOSIS — Z331 Pregnant state, incidental: Secondary | ICD-10-CM

## 2015-03-30 NOTE — Patient Instructions (Signed)
Commonly Asked Questions During Pregnancy  Cats: A parasite can be excreted in cat feces.  To avoid exposure you need to have another person empty the little box.  If you must empty the litter box you will need to wear gloves.  Wash your hands after handling your cat.  This parasite can also be found in raw or undercooked meat so this should also be avoided.  Colds, Sore Throats, Flu: Please check your medication sheet to see what you can take for symptoms.  If your symptoms are unrelieved by these medications please call the office.  Dental Work: Most any dental work your dentist recommends is permitted.  X-rays should only be taken during the first trimester if absolutely necessary.  Your abdomen should be shielded with a lead apron during all x-rays.  Please notify your provider prior to receiving any x-rays.  Novocaine is fine; gas is not recommended.  If your dentist requires a note from us prior to dental work please call the office and we will provide one for you.  Exercise: Exercise is an important part of staying healthy during your pregnancy.  You may continue most exercises you were accustomed to prior to pregnancy.  Later in your pregnancy you will most likely notice you have difficulty with activities requiring balance like riding a bicycle.  It is important that you listen to your body and avoid activities that put you at a higher risk of falling.  Adequate rest and staying well hydrated are a must!  If you have questions about the safety of specific activities ask your provider.    Exposure to Children with illness: Try to avoid obvious exposure; report any symptoms to us when noted,  If you have chicken pos, red measles or mumps, you should be immune to these diseases.   Please do not take any vaccines while pregnant unless you have checked with your OB provider.  Fetal Movement: After 28 weeks we recommend you do "kick counts" twice daily.  Lie or sit down in a calm quiet environment and  count your baby movements "kicks".  You should feel your baby at least 10 times per hour.  If you have not felt 10 kicks within the first hour get up, walk around and have something sweet to eat or drink then repeat for an additional hour.  If count remains less than 10 per hour notify your provider.  Fumigating: Follow your pest control agent's advice as to how long to stay out of your home.  Ventilate the area well before re-entering.  Hemorrhoids:   Most over-the-counter preparations can be used during pregnancy.  Check your medication to see what is safe to use.  It is important to use a stool softener or fiber in your diet and to drink lots of liquids.  If hemorrhoids seem to be getting worse please call the office.   Hot Tubs:  Hot tubs Jacuzzis and saunas are not recommended while pregnant.  These increase your internal body temperature and should be avoided.  Intercourse:  Sexual intercourse is safe during pregnancy as long as you are comfortable, unless otherwise advised by your provider.  Spotting may occur after intercourse; report any bright red bleeding that is heavier than spotting.  Labor:  If you know that you are in labor, please go to the hospital.  If you are unsure, please call the office and let us help you decide what to do.  Lifting, straining, etc:  If your job requires heavy   lifting or straining please check with your provider for any limitations.  Generally, you should not lift items heavier than that you can lift simply with your hands and arms (no back muscles)  Painting:  Paint fumes do not harm your pregnancy, but may make you ill and should be avoided if possible.  Latex or water based paints have less odor than oils.  Use adequate ventilation while painting.  Permanents & Hair Color:  Chemicals in hair dyes are not recommended as they cause increase hair dryness which can increase hair loss during pregnancy.  " Highlighting" and permanents are allowed.  Dye may be  absorbed differently and permanents may not hold as well during pregnancy.  Sunbathing:  Use a sunscreen, as skin burns easily during pregnancy.  Drink plenty of fluids; avoid over heating.  Tanning Beds:  Because their possible side effects are still unknown, tanning beds are not recommended.  Ultrasound Scans:  Routine ultrasounds are performed at approximately 20 weeks.  You will be able to see your baby's general anatomy an if you would like to know the gender this can usually be determined as well.  If it is questionable when you conceived you may also receive an ultrasound early in your pregnancy for dating purposes.  Otherwise ultrasound exams are not routinely performed unless there is a medical necessity.  Although you can request a scan we ask that you pay for it when conducted because insurance does not cover " patient request" scans.  Work: If your pregnancy proceeds without complications you may work until your due date, unless your physician or employer advises otherwise.  Round Ligament Pain/Pelvic Discomfort:  Sharp, shooting pains not associated with bleeding are fairly common, usually occurring in the second trimester of pregnancy.  They tend to be worse when standing up or when you remain standing for long periods of time.  These are the result of pressure of certain pelvic ligaments called "round ligaments".  Rest, Tylenol and heat seem to be the most effective relief.  As the womb and fetus grow, they rise out of the pelvis and the discomfort improves.  Please notify the office if your pain seems different than that described.  It may represent a more serious condition.           Cystic Fibrosis Cystic fibrosis is a disease that affects many different parts of your body. Cystic fibrosis is often thought of as a lung disease, and breathing (respiratory) failure is the most serious related problem. However, other parts of your body can also be affected, including your skin,  pancreas, liver, intestines, sinuses, and sex organs.  Cystic fibrosis is caused by a problem with the way that your body's cells handle water and salt. This problem can make secretions produced by your body thicker than they should be. Mucus in the lungs is usually thin and watery. In cystic fibrosis, mucus in the lungs becomes thick and very sticky, making it difficult to breathe freely and causing frequent lung infections. Cystic fibrosis can also cause digestive juices from the pancreas and liver to be too thick, making it harder for the body to get the fat and protein it needs from food.  Cystic fibrosis is a lifelong disease. There is no cure. However, with care and treatment, people with the disease can feel good and lead productive lives. CAUSES  Cystic fibrosis is an inherited disease caused by a defective gene. This defective gene leads to a change in a protein that affects  all the cells in your body, including the cells that make mucus and sweat. The gene is inherited as a recessive trait. That means a person may have the gene without developing the disease. To develop cystic fibrosis, a person must inherit two copies of the defective gene, one from each parent.  RISK FACTORS Factors that can increase your risk of cystic fibrosis include:   A brother or sister with cystic fibrosis.  A parent with cystic fibrosis.  Parents that have the gene that causes cystic fibrosis.  White race. SIGNS AND SYMPTOMS  The signs and symptoms of cystic fibrosis vary from person to person. They also differ by age. Possible symptoms include:  Chronic coughing or wheezing.  Shortness of breath.  Frequent lung infections.  Frequent sinusitis or small growths in the nose (nasal polyps).  Difficulty gaining weight.  Lack of normal growth (in children).   Bulky or greasy bowel movements.   Tissue from the rectum protruding from the anal opening (rectal prolapse).   Inflammation of the pancreas  (recurrent pancreatitis).   Prolonged yellowing of the skin (jaundice).   Unusually salty sweat or skin.   Heat exhaustion with low salt in the blood.   Club-shaped fingers or toes.  DIAGNOSIS  Diagnosis of cystic fibrosis usually occurs in early childhood. In many areas, newborns are routinely screened for the disease. This is done using a blood test to measure a chemical in the blood that is often elevated in newborns with the disease. Extra testing is done if a blood test is abnormal or if a new baby shows signs of the disease. Testing may also be done if a person shows signs of the disease at any age. The following tests are used to confirm the diagnosis:   Sweat test. This is the standard test used to make a diagnosis of cystic fibrosis. It is also called a sweat chloride test. A chemical will be applied to make a patch of skin sweat. Then the sweat will be analyzed to see if it contains the chemicals present in cystic fibrosis.   DNA testing. This can be used to determine if the genetic mutations found in cystic fibrosis are present.  Other tests are commonly done to determine the nature and extent of the disease. These may include:  Blood tests to evaluate salt balance, liver function, vitamin levels, and blood counts.   X-rays. Chest or sinus X-rays can show how the lungs and breathing system are affected by the disease.   Lung (pulmonary) function tests. These tests determine how well the lungs are working.   Sputum culture. This test analyzes mucus from the lungs to find out if there is an infection.   Stool testing. This is done to see if the pancreas is working normally.   Fertility testing. In males, ultrasonography of the testes might be done.  TREATMENT  There is no cure for cystic fibrosis. Treatment will focus on managing the symptoms and problems caused by the disease. The treatment for people with cystic fibrosis should be managed through a center that  specializes in the disease, particularly a center that is accredited by the Cystic Fibrosis Foundation. To find a center near you, go to the Cystic Fibrosis Foundation web site at [email or web address].  Treatment will vary and may include:   Medicines to thin the mucus or to help with breathing.  Antibiotic medicines to prevent or treat infections.  Vaccines to prevent infections.  Enzyme capsules to aid digestion.  Dietary changes,  such as:  A high-calorie diet to help maintain a healthy weight. This helps reduce coughing and breathing problems.   A high-salt diet or salt supplements. The body needs a balance of salt and other chemicals.  Nutritional supplements.   Drinking lots of fluids.   Chest percussion ("thumping") treatments or other methods to help clear mucus from the airways.   Surgery. In some cases, surgery may be done to remove nasal polyps, to relieve a digestive system blockage, or to perform a lung or liver transplant. HOME CARE INSTRUCTIONS   Take medicines only as directed by your health care provider.  Perform any treatments to clear mucus as directed by your health care provider.   Perform any recommended techniques to improve breathing.   Follow any dietary instructions given by your health care provider.  Do not smoke. Stay away from secondhand smoke.   Exercise as directed by your health care provider. Be as physically active as possible.   Stay up-to-date on immunizations or vaccinations.   Wash your hands often. This will reduce the chance of infection.  Consider joining a support group for people with cystic fibrosis.  Keep all follow-up visits as directed by your health care provider. SEEK MEDICAL CARE IF:   You do not feel like eating, or you are losing weight.   You have no energy.   You have constipation or diarrhea.  You develop a fever. SEEK IMMEDIATE MEDICAL CARE IF:  You have difficulty breathing.   You develop a  worsening cough.  There is a large increase in how much mucus your body makes, or there is a change in your mucus.  You have new or severe abdominal pain.  You develop yellowing of the skin or eyes.  You are throwing up dark green fluid.  MAKE SURE YOU:  Understand these instructions.  Will watch your condition.  Will get help right away if you are not doing well or get worse.   This information is not intended to replace advice given to you by your health care provider. Make sure you discuss any questions you have with your health care provider.   Document Released: 03/17/2008 Document Revised: 06/05/2014 Document Reviewed: 12/12/2012 Elsevier Interactive Patient Education 2016 [employer] Pregnancy and Zika Virus Disease Zika virus disease, or Zika, is an illness that can spread to people from mosquitoes that carry the virus. It may also spread from person to person through infected body fluids. Zika first occurred in [place], but recently it has spread to new areas. The virus occurs in tropical climates. The location of Zika continues to change. Most people who become infected with Zika virus do not develop serious illness. However, Zika may cause birth defects in an unborn baby whose mother is infected with the virus. It may also increase the risk of miscarriage. WHAT ARE THE SYMPTOMS OF ZIKA VIRUS DISEASE? In many cases, people who have been infected with Zika virus do not develop any symptoms. If symptoms appear, they usually start about a week after the person is infected. Symptoms are usually mild. They may include:  Fever.  Rash.  Red eyes.  Joint pain. HOW DOES ZIKA VIRUS DISEASE SPREAD? The main way that Zika virus spreads is through the bite of a certain type of mosquito. Unlike most types of mosquitos, which bite only at night, the type of mosquito that carries Zika virus bites both at night and during the day. Zika virus can also spread through sexual contact,  through a  blood transfusion, and from a mother to her baby before or during birth. Once you have had Zika virus disease, it is unlikely that you will get it again. CAN I PASS ZIKA TO MY BABY DURING PREGNANCY? Yes, Zika can pass from a mother to her baby before or during birth. WHAT PROBLEMS CAN ZIKA CAUSE FOR MY BABY? A woman who is infected with Zika virus while pregnant is at risk of having her baby born with a condition in which the brain or head is smaller than expected (microcephaly). Babies who have microcephaly can have developmental delays, seizures, hearing problems, and vision problems. Having Zika virus disease during pregnancy can also increase the risk of miscarriage. HOW CAN ZIKA VIRUS DISEASE BE PREVENTED? There is no vaccine to prevent Zika. The best way to prevent the disease is to avoid infected mosquitoes and avoid exposure to body fluids that can spread the virus. Avoid any possible exposure to Zika by taking the following precautions. For women and their sex partners:  Avoid traveling to high-risk areas. The locations where [place] is being reported change often. To identify high-risk areas, check the CDC travel website: http://davidson-gomez.com/  If you or your sex partner must travel to a high-risk area, talk with a health care provider before and after traveling.  Take all precautions to avoid mosquito bites if you live in, or travel to, any of the high-risk areas. Insect repellents are safe to use during pregnancy.  Ask your health care provider when it is safe to have sexual contact. For women:  If you are pregnant or trying to become pregnant, avoid sexual contact with persons who may have been exposed to [place] virus, persons who have possible symptoms of Zika, or persons whose history you are unsure about. If you choose to have sexual contact with someone who may have been exposed to [place] virus, use condoms correctly during the entire duration of sexual activity,  every time. Do not share sexual devices, as you may be exposed to body fluids.  Ask your health care provider about when it is safe to attempt pregnancy after a possible exposure to Zika virus. WHAT STEPS SHOULD I TAKE TO AVOID MOSQUITO BITES? Take these steps to avoid mosquito bites when you are in a high-risk area:  Wear loose clothing that covers your arms and legs.  Limit your outdoor activities.  Do not open windows unless they have window screens.  Sleep under mosquito nets.  Use insect repellent. The best insect repellents have:  DEET, picaridin, oil of lemon eucalyptus (OLE), or IR3535 in them.  Higher amounts of an active ingredient in them.  Remember that insect repellents are safe to use during pregnancy.  Do not use OLE on children who are younger than 39 years of age. Do not use insect repellent on babies who are younger than 72 months of age.  Cover your child's stroller with mosquito netting. Make sure the netting fits snugly and that any loose netting does not cover your child's mouth or nose. Do not use a blanket as a mosquito-protection cover.  Do not apply insect repellent underneath clothing.  If you are using sunscreen, apply the sunscreen before applying the insect repellent.  Treat clothing with permethrin. Do not apply permethrin directly to your skin. Follow label directions for safe use.  Get rid of standing water, where mosquitoes may reproduce. Standing water is often found in items such as buckets, bowls, animal food dishes, and flowerpots. When you return from traveling to  any high-risk area, continue taking actions to protect yourself against mosquito bites for 3 weeks, even if you show no signs of illness. This will prevent spreading Zika virus to uninfected mosquitoes. WHAT SHOULD I KNOW ABOUT THE SEXUAL TRANSMISSION OF ZIKA? People can spread Zika to their sexual partners during vaginal, anal, or oral sex, or by sharing sexual devices. Many people  with [place] do not develop symptoms, so a person could spread the disease without knowing that they are infected. The greatest risk is to women who are pregnant or who may become pregnant. Zika virus can live longer in semen than it can live in blood. Couples can prevent sexual transmission of the virus by:  Using condoms correctly during the entire duration of sexual activity, every time. This includes vaginal, anal, and oral sex.  Not sharing sexual devices. Sharing increases your risk of being exposed to body fluid from another person.  Avoiding all sexual activity until your health care provider says it is safe. SHOULD I BE TESTED FOR ZIKA VIRUS? A sample of your blood can be tested for Zika virus. A pregnant woman should be tested if she may have been exposed to the virus or if she has symptoms of Zika. She may also have additional tests done during her pregnancy, such ultrasound testing. Talk with your health care provider about which tests are recommended.   This information is not intended to replace advice given to you by your health care provider. Make sure you discuss any questions you have with your health care provider.   Document Released: 02/03/2015 Document Reviewed: 01/27/2015 Elsevier Interactive Patient Education 2016 [employer]. Minor Illnesses and Medications in Pregnancy  Cold/Flu:  Sudafed for congestion- Robitussin (plain) for cough- Tylenol for discomfort.  Please follow the directions on the label.  Try not to take any more than needed.  OTC Saline nasal spray and air humidifier or cool-mist  Vaporizer to sooth nasal irritation and to loosen congestion.  It is also important to increase intake of non carbonated fluids, especially if you have a fever.  Constipation:  Colace-2 capsules at bedtime; Metamucil- follow directions on label; Senokot- 1 tablet at bedtime.  Any one of these medications can be used.  It is also very important to increase fluids and fruits along  with regular exercise.  If problem persists please call the office.  Diarrhea:  Kaopectate as directed on the label.  Eat a bland diet and increase fluids.  Avoid highly seasoned foods.  Headache:  Tylenol 1 or 2 tablets every 3-4 hours as needed  Indigestion:  Maalox, Mylanta, Tums or Rolaids- as directed on label.  Also try to eat small meals and avoid fatty, greasy or spicy foods.  Nausea with or without Vomiting:  Nausea in pregnancy is caused by increased levels of hormones in the body which influence the digestive system and cause irritation when stomach acids accumulate.  Symptoms usually subside after 1st trimester of pregnancy.  Try the following:  Keep saltines, graham crackers or dry toast by your bed to eat upon awakening.  Don't let your stomach get empty.  Try to eat 5-6 small meals per day instead of 3 large ones.  Avoid greasy fatty or highly seasoned foods.   Take OTC Unisom 1 tablet at bed time along with OTC Vitamin B6 25-50 mg 3 times per day.    If nausea continues with vomiting and you are unable to keep down food and fluids you may need a prescription medication.  Please notify your provider.   Sore throat:  Chloraseptic spray, throat lozenges and or plain Tylenol.  Vaginal Yeast Infection:  OTC Monistat for 7 days as directed on label.  If symptoms do not resolve within a week notify provider.  If any of the above problems do not subside with recommended treatment please call the office for further assistance.   Do not take Aspirin, Advil, Motrin or Ibuprofen.  * * OTC= Over the counter

## 2015-03-30 NOTE — Progress Notes (Signed)
  Paula [name] presents for NOB nurse interview visit. G-2.  P-1001. Pregnancy confirmed here in office on 02/23/2015. Pregnancy eduction material explained and given. No cats in the home. NOB labs ordered. TSH/HbgA1c due to Increased BMI, HIV labs and Drug screen were explained optional and she could opt out of tests but did not decline. Drug screen ordered. PNV encouraged. Pt wants [facility] genetic testing and [facility] due to daughter being a carrier and also has mgm's brother that had cystic fibrosis. Pt's husband done the [facility] genetic test also.  Pt. To follow up with provider in 1 weeks for NOB physical.  All questions answered.  ZIKA EXPOSURE SCREEN:  The patient has not traveled to a [place] Virus endemic area within the past 6 months, nor has she had unprotected sex with a partner who has travelled to a [place] endemic region within the past 6 months. The patient has been advised to notify us if these factors change any time during this current pregnancy, so adequate testing and monitoring can be initiated.

## 2015-03-31 ENCOUNTER — Other Ambulatory Visit: Payer: 59

## 2015-04-01 LAB — PAIN MGT SCRN (14 DRUGS), UR
Amphetamine Screen, Ur: NEGATIVE ng/mL
BENZODIAZEPINE SCREEN, URINE: NEGATIVE ng/mL
BUPRENORPHINE, URINE: NEGATIVE ng/mL
Barbiturate Screen, Ur: NEGATIVE ng/mL
CREATININE(CRT), U: 139.1 mg/dL (ref 20.0–300.0)
Cannabinoids Ur Ql Scn: NEGATIVE ng/mL
Cocaine(Metab.)Screen, Urine: NEGATIVE ng/mL
FENTANYL, URINE: NEGATIVE pg/mL
MEPERIDINE SCREEN, URINE: NEGATIVE ng/mL
Methadone Scn, Ur: NEGATIVE ng/mL
OXYCODONE+OXYMORPHONE UR QL SCN: NEGATIVE ng/mL
Opiate Scrn, Ur: NEGATIVE ng/mL
PCP SCRN UR: NEGATIVE ng/mL
Ph of Urine: 8.2 (ref 4.5–8.9)
Propoxyphene, Screen: NEGATIVE ng/mL
Tramadol Ur Ql Scn: NEGATIVE ng/mL

## 2015-04-01 LAB — URINALYSIS, ROUTINE W REFLEX MICROSCOPIC
Bilirubin, UA: NEGATIVE
Glucose, UA: NEGATIVE
Ketones, UA: NEGATIVE
LEUKOCYTES UA: NEGATIVE
Nitrite, UA: NEGATIVE
PH UA: 8 — AB (ref 5.0–7.5)
RBC, UA: NEGATIVE
Specific Gravity, UA: 1.024 (ref 1.005–1.030)
Urobilinogen, Ur: 0.2 mg/dL (ref 0.2–1.0)

## 2015-04-01 LAB — CBC WITH DIFFERENTIAL/PLATELET
Basophils Absolute: 0 10*3/uL (ref 0.0–0.2)
Basos: 0 %
EOS (ABSOLUTE): 0.2 10*3/uL (ref 0.0–0.4)
Eos: 1 %
Hematocrit: 39.3 % (ref 34.0–46.6)
Hemoglobin: 13.6 g/dL (ref 11.1–15.9)
Immature Grans (Abs): 0 10*3/uL (ref 0.0–0.1)
Immature Granulocytes: 0 %
Lymphocytes Absolute: 2.2 10*3/uL (ref 0.7–3.1)
Lymphs: 20 %
MCH: 30.8 pg (ref 26.6–33.0)
MCHC: 34.6 g/dL (ref 31.5–35.7)
MCV: 89 fL (ref 79–97)
Monocytes Absolute: 0.7 10*3/uL (ref 0.1–0.9)
Monocytes: 7 %
Neutrophils Absolute: 8 10*3/uL — ABNORMAL HIGH (ref 1.4–7.0)
Neutrophils: 72 %
Platelets: 290 10*3/uL (ref 150–379)
RBC: 4.42 x10E6/uL (ref 3.77–5.28)
RDW: 13.5 % (ref 12.3–15.4)
WBC: 11.2 10*3/uL — ABNORMAL HIGH (ref 3.4–10.8)

## 2015-04-01 LAB — ANTIBODY SCREEN: Antibody Screen: NEGATIVE

## 2015-04-01 LAB — NICOTINE SCREEN, URINE: COTININE UR QL SCN: NEGATIVE ng/mL

## 2015-04-01 LAB — HEMOGLOBIN A1C
Est. average glucose Bld gHb Est-mCnc: 91 mg/dL
Hgb A1c MFr Bld: 4.8 % (ref 4.8–5.6)

## 2015-04-01 LAB — HIV ANTIBODY (ROUTINE TESTING W REFLEX): HIV Screen 4th Generation wRfx: NONREACTIVE

## 2015-04-01 LAB — RPR: RPR: NONREACTIVE

## 2015-04-01 LAB — RH TYPE: RH TYPE: POSITIVE

## 2015-04-01 LAB — TSH: TSH: 1.68 u[IU]/mL (ref 0.450–4.500)

## 2015-04-01 LAB — RUBELLA ANTIBODY, IGM: Rubella IgM: <20 AU/mL (ref 0.0–19.9)

## 2015-04-01 LAB — HEPATITIS B SURFACE ANTIGEN: HEP B S AG: NEGATIVE

## 2015-04-01 LAB — VARICELLA ZOSTER ANTIBODY, IGM: Varicella IgM: <0.91 index (ref 0.00–0.90)

## 2015-04-01 LAB — ABO

## 2015-04-02 LAB — URINE CULTURE, OB REFLEX

## 2015-04-02 LAB — GC/CHLAMYDIA PROBE AMP
Chlamydia trachomatis, NAA: NEGATIVE
Neisseria gonorrhoeae by PCR: NEGATIVE

## 2015-04-02 LAB — CULTURE, OB URINE

## 2015-04-05 ENCOUNTER — Encounter: Payer: Self-pay | Admitting: Obstetrics and Gynecology

## 2015-04-06 ENCOUNTER — Telehealth: Payer: Self-pay | Admitting: Obstetrics and Gynecology

## 2015-04-06 DIAGNOSIS — O219 Vomiting of pregnancy, unspecified: Secondary | ICD-10-CM

## 2015-04-06 MED ORDER — DOXYLAMINE-PYRIDOXINE 10-10 MG PO TBEC: DELAYED_RELEASE_TABLET | ORAL | Status: DC

## 2015-04-06 MED ORDER — PROMETHAZINE HCL 25 MG PO TABS: 25.0000 mg | ORAL_TABLET | Freq: Four times a day (QID) | ORAL | Status: DC | PRN

## 2015-04-06 NOTE — Telephone Encounter (Signed)
Called pt Paula [name] informing her that per our office protocol Zofran is a last option for n/v in pregnancy. Per nursing protocol pt is to begin with Diclegis or phenergan. Sent RX in for both and advised pt that she could pick up the script of her choice.

## 2015-04-06 NOTE — Telephone Encounter (Signed)
Patient called stating she has been throwing up 2 or more times a day and is having trouble keeping anything down which is causing headaches.  She is requesting [facility].Thanks

## 2015-04-07 ENCOUNTER — Other Ambulatory Visit: Payer: Self-pay

## 2015-04-07 DIAGNOSIS — O219 Vomiting of pregnancy, unspecified: Secondary | ICD-10-CM

## 2015-04-07 MED ORDER — DOXYLAMINE-PYRIDOXINE 10-10 MG PO TBEC: DELAYED_RELEASE_TABLET | ORAL | Status: DC

## 2015-04-09 ENCOUNTER — Telehealth: Payer: Self-pay

## 2015-04-09 ENCOUNTER — Ambulatory Visit (INDEPENDENT_AMBULATORY_CARE_PROVIDER_SITE_OTHER): Payer: 59 | Admitting: Obstetrics and Gynecology

## 2015-04-09 ENCOUNTER — Encounter: Payer: Self-pay | Admitting: Obstetrics and Gynecology

## 2015-04-09 ENCOUNTER — Ambulatory Visit: Payer: 59

## 2015-04-09 VITALS — BP 118/72 | HR 76 | Wt 148.1 lb

## 2015-04-09 DIAGNOSIS — Z124 Encounter for screening for malignant neoplasm of cervix: Secondary | ICD-10-CM

## 2015-04-09 DIAGNOSIS — R399 Unspecified symptoms and signs involving the genitourinary system: Secondary | ICD-10-CM

## 2015-04-09 DIAGNOSIS — Z36 Encounter for antenatal screening of mother: Secondary | ICD-10-CM | POA: Diagnosis not present

## 2015-04-09 DIAGNOSIS — Z3687 Encounter for antenatal screening for uncertain dates: Secondary | ICD-10-CM

## 2015-04-09 DIAGNOSIS — N898 Other specified noninflammatory disorders of vagina: Secondary | ICD-10-CM

## 2015-04-09 DIAGNOSIS — Z369 Encounter for antenatal screening, unspecified: Secondary | ICD-10-CM

## 2015-04-09 NOTE — Telephone Encounter (Signed)
-----   Message from Hildred LaserAnika Cherry, MD sent at 04/05/2015 10:18 AM EST ----- Please inform patient that [facility] screening was normal, and fetal sex is female if desires to know gender.

## 2015-04-09 NOTE — Telephone Encounter (Signed)
LM for pt to call back for these results if they were not reviewed at her appt earlier today.

## 2015-04-09 NOTE — Progress Notes (Signed)
OBSTETRIC INITIAL PRENATAL CLINIC VISIT  Subjective:    Paula [name] is being seen today for her first obstetrical visit.  This is not a planned pregnancy, but desired. She is at [redacted]w[redacted]d gestation, Estimated Date of Delivery: 11/02/15 by 5 week sono, LMP unsure (sometime around 01/19/2015).  Her obstetrical history is significant for migraines, daughter is a CF carrier, h/o PCOS, hypothyroidism, and h/o CPD requiring C-section. Relationship with FOB: spouse, living together. Patient does intend to breast feed. Pregnancy history fully reviewed.  Menstrual History: Obstetric History   G2   P1   T1   P0   A0   TAB0   SAB0   E0   M0   L1     # Outcome Date GA Lbr Len/2nd Weight Sex Delivery Anes PTL Lv  2 Current           1 Term 04/19/13 [redacted]w[redacted]d  7 lb 4 oz (3.289 kg) F CS-LTranv   Y    Obstetric Comments  LTCS: small statue, CPD.     Menarche age: 73 Patient's last menstrual period was 01/16/2015 (approximate).  Denies h/o STIs or abnormal pap smears. Last pap smear 11/2013, normal.    Past Medical History  Diagnosis Date  . Oligomenorrhea 12/22/2014  . 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 Hypothyroidism   . BULIMIA 01/17/2008    Annotation: AGE 34 Qualifier: Diagnosis of  By: Lorrene Reid LPN, Burna Mortimer      Past Surgical History  Procedure Laterality Date  . Cesarean section  04/19/2013    LTCS; CPD  . Tonsillectomy      Family History  Problem Relation Age of Onset  . Endometriosis Mother   . Heart disease Maternal Grandmother     valvular problem    Social History   Social History  . Marital Status: Married    Spouse Name: N/A  . Number of Children: N/A  . Years of Education: N/A   Occupational History  . ED tech [employer]   Social History Main Topics  . Smoking status: Never Smoker   . Smokeless tobacco: Never Used  . Alcohol Use: 0.0 oz/week    0 Standard drinks or equivalent per week  . Drug Use: No  . Sexual Activity:    Partners: Male    Birth Control/  Protection: None   Other Topics Concern  . Not on file   Social History Narrative    Current Outpatient Prescriptions on File Prior to Visit  Medication Sig Dispense Refill  . Doxylamine-Pyridoxine (DICLEGIS) 10-10 MG TBEC Take 1 tablet in the morning and one in the evening. If symptoms persist take 1 in the morning and 2 at bedtime. If symptoms persist take 1 tablet mid-afternoon and 2 tablets at bedtime. 100 tablet 3  . levothyroxine (SYNTHROID, LEVOTHROID) 75 MCG tablet Take 1 tablet (75 mcg total) by mouth daily before breakfast. 30 tablet 6  . ranitidine (ZANTAC) 150 MG capsule Take 150 mg by mouth as needed for heartburn.     No current facility-administered medications on file prior to visit.    Allergies  Allergen Reactions  . Iodine Anaphylaxis    Contrast   . Shellfish Allergy Hives    Review of Systems General:Not Present- Fever, Weight Loss and Weight Gain. Skin:Not Present- Rash. HEENT:Not Present- Blurred Vision, Headache and Bleeding Gums. Respiratory:Not Present- Difficulty Breathing. Breast:Not Present- Breast Mass. Cardiovascular:Not Present- Chest Pain, Elevated Blood Pressure, Fainting / Blacking Out and Shortness of Breath. Gastrointestinal:Present - Nausea and vomiting;  Not Present- Abdominal Pain, Constipation,  Female Genitourinary:Not Present- Frequency, Painful Urination, Pelvic Pain, Vaginal Bleeding, Vaginal Discharge, Fetal Movements Decreased, Urinary Complaints and Vaginal Fluid. Musculoskeletal:Not Present- Back Pain and Leg Cramps. Neurological:Not Present- Dizziness. Psychiatric:Not Present- Depression.      Objective:    BP 118/72 mmHg  Pulse 76  Wt 148 lb 1.6 oz (67.178 kg)  LMP 01/16/2015 (Approximate)  Body mass index is 30.96 kg/(m^2).  General Appearance:    Alert, cooperative, no distress, appears stated age,  Head:    Normocephalic, without obvious abnormality, atraumatic  Eyes:    PERRL, conjunctiva/corneas clear,  EOM's intact, both eyes  Ears:    Normal external ear canals, both ears  Nose:   Nares normal, septum midline, mucosa normal, no drainage or sinus tenderness  Throat:   Lips, mucosa, and tongue normal; teeth and gums normal  Neck:   Supple, symmetrical, trachea midline, no adenopathy; thyroid: no enlargement/tenderness/nodules; no carotid bruit or JVD  Back:     Symmetric, no curvature, ROM normal, no CVA tenderness  Lungs:     Clear to auscultation bilaterally, respirations unlabored  Chest Wall:    No tenderness or deformity   Heart:    Regular rate and rhythm, S1 and S2 normal, no murmur, rub or gallop  Breast Exam:    No tenderness, masses, or nipple abnormality  Abdomen:     Soft, tender at pubic symphysis, bowel sounds active all four quadrants, no masses, no organomegaly.  FHT 163 bpm (performed by sono).  Genitalia:    Pelvic:external genitalia normal, vagina with lesions, discharge, or tenderness, rectovaginal septum  normal. Cervix normal in appearance, no cervical motion tenderness; no adnexal masses or tenderness.  Uterus slightly enlarged, 10 week size, mobile. .   Rectal:    Normal external sphincter.  No hemorrhoids appreciated. Internal exam not done.   Extremities:   Extremities normal, atraumatic, no cyanosis or edema  Pulses:   2+ and symmetric all extremities  Skin:   Skin color, texture, turgor normal, no rashes or lesions  Lymph nodes:   Cervical, supraclavicular, and axillary nodes normal  Neurologic:   CNII-XII intact, normal strength, sensation and reflexes throughout      Assessment:   Pregnancy at 10 and 3/7 weeks   Migraines Hypothyroidism CF carrier status of previous child H/o C-section x 1 for CPD Suprapubic tenderness Nausea/vomiting of pregnancy Obesity (due to short stature)   Plan:    Initial labs drawn reviewed. Prenatal vitamins. Problem list reviewed and updated. Advised that a majority of patients with migraines improve during pregnancy.    S/p Panorama scan for genetic screening. Screen normal.  Patient informed of results. Horizon screening pending for patient and husband due to unknown CF carrier status and child is a CF carrier. Aware that there is a 25% chance of CF in fetus if both parents are carriers. No prior h/o CF in either family.  Role of ultrasound in pregnancy discussed; fetal survey: to be ordered at appropriate gestational age. Continue Diclegis for nausea/vomiting.  If still no relief after maximum dose (currently only taking 3 of 4 pills), can prescribe stronger antiemetic (Phenergan or Zofran).  TSH normal, performed 03/31/15.  Will continue to monitor each trimester and titrate dose of Levothyroxine as needed.  H/o prior C-section for CPD.  Will likely need repeat C-section unless fetus is significantly smaller this pregnancy (last infant 7 lb 4 oz).   New OB counseling:  The patient has been given an overview  regarding routine prenatal care.  Recommendations regarding diet, weight gain, and exercise in pregnancy were given.  Benefits of Breast Feeding were discussed. The patient is encouraged to consider nursing her baby post partum.  Follow up in 4 weeks.   A total of 30 minutes were spent face-to-face with the patient during this encounter and over half of that time involved counseling and coordination of care.    Hildred Laser, MD [facility] 04/09/2015 8:44 PM

## 2015-04-10 LAB — URINALYSIS, ROUTINE W REFLEX MICROSCOPIC
Bilirubin, UA: NEGATIVE
Glucose, UA: NEGATIVE
KETONES UA: NEGATIVE
LEUKOCYTES UA: NEGATIVE
Nitrite, UA: NEGATIVE
Protein, UA: NEGATIVE
SPEC GRAV UA: 1.024 (ref 1.005–1.030)
Urobilinogen, Ur: 0.2 mg/dL (ref 0.2–1.0)
pH, UA: 6 (ref 5.0–7.5)

## 2015-04-10 LAB — URINE CULTURE

## 2015-04-10 LAB — MICROSCOPIC EXAMINATION
BACTERIA UA: NONE SEEN
Casts: NONE SEEN /lpf

## 2015-04-13 ENCOUNTER — Encounter: Payer: Self-pay | Admitting: Obstetrics and Gynecology

## 2015-04-13 LAB — PAP IG, CT-NG, RFX HPV ASCU: PAP Smear Comment: 0

## 2015-04-20 ENCOUNTER — Ambulatory Visit (INDEPENDENT_AMBULATORY_CARE_PROVIDER_SITE_OTHER): Payer: 59 | Admitting: Obstetrics and Gynecology

## 2015-04-20 VITALS — BP 132/82 | HR 59 | Wt 147.8 lb

## 2015-04-20 DIAGNOSIS — Z141 Cystic fibrosis carrier: Secondary | ICD-10-CM

## 2015-04-20 NOTE — Progress Notes (Signed)
OB Consult: Paula FlavorsCrystal D [name] is a 34 y.o. 462P1001 female who presents for consultation regarding genetic screen.  Daughter is a CF carrier.  Patient and husband both recently tested for CF carrier status.  Both parents have tested positive for being CF carrier with Horizon testing.  Discussion had with patient regarding risk of CF in current pregnancy is 25%.   Note that further testing is recommended to assess if fetus is affected with CF or is a carrier. [facility] for further genetic counseling and testing with either CVS or amniocentesis. Brief discussion had on risks of procedure and possible CF diagnosis vs carrier status.  To be discussed further at consultation with [facility]. Referral made.  All other questions answered.   A total of 30 minutes were spent face-to-face with the patient during this encounter and over half of that time dealt with counseling and coordination of care.

## 2015-04-21 ENCOUNTER — Telehealth: Payer: Self-pay | Admitting: Obstetrics and Gynecology

## 2015-04-21 NOTE — Telephone Encounter (Signed)
Called pt advised her that it is not her mucus plug she is seeing as she is only in the first trimester and the mucus plug has not began to develop. Advised pt that per Dr.Cherry she is likely having a cervical discharge related to the pregnancy. Advised pt that as long as there is no foul odor, itching, or discomfort there is no need to be seen.

## 2015-04-21 NOTE — Telephone Encounter (Signed)
Paula [name] went to bathroom, a lot of mucus and pieces of stuff was in the toilet Like she said her mucus plug. No blood. Please call her and let her if this is ok

## 2015-04-29 ENCOUNTER — Ambulatory Visit: Payer: Self-pay

## 2015-05-03 ENCOUNTER — Ambulatory Visit
Admission: RE | Admit: 2015-05-03 | Discharge: 2015-05-03 | Disposition: A | Discharge status: Home / Self Care | Payer: 59 | Source: Ambulatory Visit | Attending: Obstetrics & Gynecology | Admitting: Obstetrics & Gynecology

## 2015-05-03 VITALS — BP 133/77 | HR 75 | Temp 98.2°F | Wt 150.0 lb

## 2015-05-03 DIAGNOSIS — Z141 Cystic fibrosis carrier: Secondary | ICD-10-CM | POA: Diagnosis not present

## 2015-05-03 DIAGNOSIS — O09892 Supervision of other high risk pregnancies, second trimester: Secondary | ICD-10-CM | POA: Insufficient documentation

## 2015-05-03 HISTORY — DX: Cystic fibrosis carrier: Z14.1

## 2015-05-03 HISTORY — DX: Supervision of other high risk pregnancies, second trimester: O09.892

## 2015-05-03 NOTE — Progress Notes (Addendum)
Referring physician: [facility]  75 minute consultation  Paula [name] was referred for genetic counseling at [facility] due to positive results on cystic fibrosis (CF) carrier testing for she and her partner.  This letter is a summary of our conversation and testing options for the current pregnancy.  We first obtained a detailed family history. The father of the baby, Paula [name], is said to have a strong family history of cancer including breast cancer in his mother, who passed away at age 58 years, maternal grandparents and several maternal aunts and uncles with cancer.  However, the type of cancer is not known in most of these relatives.  We discussed that most cases of cancer occur by chance, but that in some families there are strong inherited factors that [name] predispose to certain types of cancer.  If Paula [name] would like to speak to someone about this history specifically, we are happy to put them in contact with a cancer genetic counselor.  The remainder of the family history was unremarkable for birth defects, mental retardation or known genetic conditions.     The couple has one daughter, Paula [name] (dob 04/19/13), who is in good health at 32 years old.  She was found prenatally to have pyelectasis, which resolved soon after delivery.  Paula [name] also tested positive for CF on the newborn screening tests and was sent to [facility] for evaluation for CF.  Per Paula [name], sweat chloride testing was negative and she was said to be "only a carrier".  We have requested medical records from [facility] on what testing was performed on Paula [name] to ensure that genetic testing was performed for the mutations now identified in the family.  Given the history with Paula [name], Paula [name] elected to have CF carrier screening on she and her husband through [employer].  They were also screened for SMA, DMD and fragile X and were negative for those conditions. She and her husband are of mixed European  ancestry and are not related to each other.  We also inquired about the pregnancy history.  Paula [name] reported no complications or exposures in this pregnancy that would be expected to increase the risk for birth defects.  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  Paula [name] was found to be a carrier for the 489+3A>G mutation in the gene for CF.  This is a much less common mutation, and [name] have varying clinical consequences depending upon what other mutation an individual [name] inherit.  Because delta F508 is known to be a classic severe mutation, we would predict any affected children of this couple [name] be at risk to have classic CF.  To review, CF is a genetic condition characterized by thickened mucous secretions throughout the body.  This leads to pulmonary damage, pancreatic insufficiency in some patients, digestive and reproductive problems. CF is not a disorder that can be acquired.  It is caused by an inherited change in the genetic material, or gene, for CF.  We all have two copies of all of our genes, with one copy being inherited from each parent.  CF is called a recessive condition because both copies of the CF gene must be changed for a person to show features of the disease.  If a person has one changed copy and one normal copy, they are said to be a carrier of CF and have a 50% chance of passing on  that changed gene to their children.  People who are carriers are not expected to have medical problems as a result of the gene change.  For a child to have CF, both of that child's parents must be carriers.  When two parents are carriers, there is a 1 in 4 chance to have a child with CF, a 2 in 4 chance to have a child who is a carrier, and a 1 in 4 chance to have a child with no copies of the changed gene for CF.  Therefore, the current pregnancy has a 25% chance for being affected with CF and a 75% chance for being  unaffected (either as a carrier or from two normal copies of the gene).  As we discussed, DNA testing is available to look for changes in the gene for CF in this pregnancy either in utero or by blood testing after delivery.  Prenatal testing during the pregnancy can be performed through amniocentesis.  This testing involves insertion of a small needle through the abdomen to obtain a sample of amniotic fluid.  Cells within the fluid [name] then be tested for the mutations in the parents as well as chromosome analysis.  The risk of complications including miscarriage with amniocentesis is estimated to be 1 in 200. Accuracy of the testing is greater than 99.5%, though it is important to remember that we cannot always accurately predict severity of the disease based solely upon genetic testing.  Testing of the baby on cord blood at the time of delivery can be offered if prenatal testing is declined.    We reviewed this information in detail and discussed the pros and cons of prenatal testing or waiting until after delivery for testing.  After careful consideration, the patient declines amniocentesis at this time.  Termination is not an option for this couple regardless of the CF status of the baby. The patient understands that risk for CF in the pregnancy would not alter the location of delivery for this pregnancy.  We scheduled Paula [name] to [facility] for her detailed anatomy ultrasound at approximately [redacted] weeks gestation.  Some babies with CF are known to show echogenic bowel on mid trimester ultrasound.  Babies without CF [name] also show echogenic bowel, so we stressed that the presence/absence of this ultrasound finding cannot confirm or rule out a diagnosis of CF.    Lastly, we reviewed routine screening tests in the pregnancy.  The patient already had Panorama testing for chromosome conditions which was normal.  We would recommend AFP only screening for open neural tube defects at 16-[redacted] weeks  gestation.  Dr. Valentino Saxonherry was informed about our consultation and the desire for ultrasound only at this time, as the patient prefers to decline invasive testing due to the risks.   Please call us at [phone] with any questions or concerns.  Cherly Andersoneborah F. Anothy Bufano, MS, CGC  I was present in the clinic. [place], [place] A, MD   Addendum 05/06/15: Upon review of the records on Paula [name] (dob 04/19/2011) and conversations with the [facility], it was found that the mutation present in Paula [name] was NOT included in the panel of mutations used to test Paula [name] initially.  For this reason, we have spoken with the patient and with Dr. Chelsea PrimusMinter of [facility] about testing Paula [name] for this mutation.  Information will be faxed to [facility] regarding the specific tests to order at her next visit.  Paula [name] planned to contact  their office to schedule a well visit to include this testing.  Specifically, [facility] test number [id number], testing for known familial CF mutation, should be ordered and reports from both parents carrier screening should be included with the requisition. Testing should be requested to include both the rare mutation in Ms. Montini (489+3A>G) as well as the delta F508 mutation found previously in [place] and in Mr. Kirksey.  I can be reached at [phone] with any questions regarding ordering this test.

## 2015-05-06 NOTE — Addendum Note (Signed)
Encounter addended by: Katrina Stackeborah Efe Fazzino on: 05/06/2015  3:29 PM<BR>     Documentation filed: Notes Section

## 2015-05-06 NOTE — Addendum Note (Signed)
Encounter addended by: Katrina Stackeborah Katherin Ramey on: 05/06/2015  3:53 PM<BR>     Documentation filed: Notes Section

## 2015-05-07 ENCOUNTER — Ambulatory Visit (INDEPENDENT_AMBULATORY_CARE_PROVIDER_SITE_OTHER): Payer: 59 | Admitting: Obstetrics and Gynecology

## 2015-05-07 VITALS — BP 111/66 | HR 72 | Wt 152.0 lb

## 2015-05-07 DIAGNOSIS — O99282 Endocrine, nutritional and metabolic diseases complicating pregnancy, second trimester: Secondary | ICD-10-CM

## 2015-05-07 DIAGNOSIS — Z3492 Encounter for supervision of normal pregnancy, unspecified, second trimester: Secondary | ICD-10-CM

## 2015-05-07 DIAGNOSIS — Z3482 Encounter for supervision of other normal pregnancy, second trimester: Secondary | ICD-10-CM

## 2015-05-07 DIAGNOSIS — O99280 Endocrine, nutritional and metabolic diseases complicating pregnancy, unspecified trimester: Secondary | ICD-10-CM

## 2015-05-07 DIAGNOSIS — E039 Hypothyroidism, unspecified: Secondary | ICD-10-CM | POA: Insufficient documentation

## 2015-05-07 DIAGNOSIS — Z141 Cystic fibrosis carrier: Secondary | ICD-10-CM

## 2015-05-07 LAB — POCT URINALYSIS DIPSTICK
Bilirubin, UA: NEGATIVE
Blood, UA: NEGATIVE
Glucose, UA: NEGATIVE
Ketones, UA: NEGATIVE
Nitrite, UA: NEGATIVE
PH UA: 7
PROTEIN UA: NEGATIVE
SPEC GRAV UA: 1.02
UROBILINOGEN UA: NEGATIVE

## 2015-05-07 NOTE — Progress Notes (Signed)
ROB: Complains of wheezing at night, wonders if she can use inhaler.  Encouraged patient to do so.  S/p [facility] consult for both parents being carriers of CF genes.  Patient declined amniocentesis at this time, will have detailed anatomy scan in ~ 16 weeks, and will collect cord blood at delivery for DNA testing. Counseled on weight gain.  Will need thyroid studies, flu vaccine next visit.

## 2015-05-20 ENCOUNTER — Ambulatory Visit
Admission: RE | Admit: 2015-05-20 | Discharge: 2015-05-20 | Disposition: A | Discharge status: Home / Self Care | Payer: 59 | Source: Ambulatory Visit | Attending: Obstetrics & Gynecology | Admitting: Obstetrics & Gynecology

## 2015-05-20 VITALS — BP 137/82 | HR 80 | Temp 98.4°F | Resp 18 | Ht 58.8 in | Wt 152.8 lb

## 2015-05-20 DIAGNOSIS — O09892 Supervision of other high risk pregnancies, second trimester: Secondary | ICD-10-CM | POA: Insufficient documentation

## 2015-05-20 DIAGNOSIS — Z3A16 16 weeks gestation of pregnancy: Secondary | ICD-10-CM | POA: Diagnosis not present

## 2015-05-20 DIAGNOSIS — Z141 Cystic fibrosis carrier: Secondary | ICD-10-CM | POA: Diagnosis not present

## 2015-06-03 ENCOUNTER — Encounter: Payer: Self-pay | Admitting: Obstetrics and Gynecology

## 2015-06-03 ENCOUNTER — Ambulatory Visit (INDEPENDENT_AMBULATORY_CARE_PROVIDER_SITE_OTHER): Payer: 59 | Admitting: Obstetrics and Gynecology

## 2015-06-03 VITALS — BP 128/79 | HR 84 | Wt 156.8 lb

## 2015-06-03 DIAGNOSIS — O99282 Endocrine, nutritional and metabolic diseases complicating pregnancy, second trimester: Secondary | ICD-10-CM | POA: Diagnosis not present

## 2015-06-03 DIAGNOSIS — Z3482 Encounter for supervision of other normal pregnancy, second trimester: Secondary | ICD-10-CM

## 2015-06-03 DIAGNOSIS — Z23 Encounter for immunization: Secondary | ICD-10-CM

## 2015-06-03 DIAGNOSIS — Z3492 Encounter for supervision of normal pregnancy, unspecified, second trimester: Secondary | ICD-10-CM

## 2015-06-03 DIAGNOSIS — E039 Hypothyroidism, unspecified: Secondary | ICD-10-CM | POA: Diagnosis not present

## 2015-06-03 LAB — POCT URINALYSIS DIPSTICK
Bilirubin, UA: NEGATIVE
Glucose, UA: NEGATIVE
KETONES UA: NEGATIVE
Leukocytes, UA: NEGATIVE
Nitrite, UA: NEGATIVE
PROTEIN UA: 6.5
RBC UA: NEGATIVE
SPEC GRAV UA: 1.015
UROBILINOGEN UA: NEGATIVE
pH, UA: 7.5

## 2015-06-03 MED ORDER — INFLUENZA VAC SPLIT QUAD 0.5 ML IM SUSY
0.5000 mL | PREFILLED_SYRINGE | Freq: Once | INTRAMUSCULAR | Status: AC
  Administered 2015-06-03: 0.5 mL via INTRAMUSCULAR

## 2015-06-03 MED ORDER — INFLUENZA VAC SPLIT QUAD 0.5 ML IM SUSY: 0.5000 mL | PREFILLED_SYRINGE | INTRAMUSCULAR | Status: DC

## 2015-06-03 NOTE — Progress Notes (Signed)
ROB: Patient with complaints of pelvic pressure. Denies cramping/vaginal bleeding.  S/p HR anatomy scan.  For repeat in several weeks by [facility].  Counseled again on excessive weight gain.  Thyroid studies ordered, flu vaccine given.  RTC in 4 weeks.

## 2015-06-05 LAB — THYROID PANEL WITH TSH
FREE THYROXINE INDEX: 1.7 (ref 1.2–4.9)
T3 Uptake Ratio: 19 % — ABNORMAL LOW (ref 24–39)
T4, Total: 8.7 ug/dL (ref 4.5–12.0)
TSH: 1.52 u[IU]/mL (ref 0.450–4.500)

## 2015-06-08 ENCOUNTER — Telehealth: Payer: Self-pay

## 2015-06-08 NOTE — Telephone Encounter (Signed)
-----   Message from Paula LaserAnika Cherry, MD sent at 06/07/2015  1:38 PM EST ----- Please inform patient that thyroid studies are still normal.  Can continue on current dose regimen of medication.

## 2015-06-08 NOTE — Telephone Encounter (Signed)
Called pt Paula [name] for her informing her that thyroid studies were normal and the need for her to continue on current medication.

## 2015-06-10 ENCOUNTER — Ambulatory Visit
Admission: RE | Admit: 2015-06-10 | Discharge: 2015-06-10 | Disposition: A | Discharge status: Home / Self Care | Payer: 59 | Source: Ambulatory Visit | Attending: Obstetrics and Gynecology | Admitting: Obstetrics and Gynecology

## 2015-06-10 VITALS — BP 130/70 | HR 86 | Temp 98.4°F | Resp 18 | Ht 58.8 in | Wt 155.6 lb

## 2015-06-10 DIAGNOSIS — O09892 Supervision of other high risk pregnancies, second trimester: Secondary | ICD-10-CM

## 2015-06-10 DIAGNOSIS — Z141 Cystic fibrosis carrier: Secondary | ICD-10-CM | POA: Diagnosis not present

## 2015-06-10 DIAGNOSIS — Z3A19 19 weeks gestation of pregnancy: Secondary | ICD-10-CM | POA: Insufficient documentation

## 2015-06-10 DIAGNOSIS — Z36 Encounter for antenatal screening of mother: Secondary | ICD-10-CM | POA: Insufficient documentation

## 2015-06-30 ENCOUNTER — Ambulatory Visit (INDEPENDENT_AMBULATORY_CARE_PROVIDER_SITE_OTHER): Payer: 59 | Admitting: Obstetrics and Gynecology

## 2015-06-30 ENCOUNTER — Encounter: Payer: 59 | Admitting: Obstetrics and Gynecology

## 2015-06-30 VITALS — BP 114/73 | HR 76 | Wt 157.9 lb

## 2015-06-30 DIAGNOSIS — E039 Hypothyroidism, unspecified: Secondary | ICD-10-CM

## 2015-06-30 DIAGNOSIS — Z141 Cystic fibrosis carrier: Secondary | ICD-10-CM

## 2015-06-30 DIAGNOSIS — O99282 Endocrine, nutritional and metabolic diseases complicating pregnancy, second trimester: Secondary | ICD-10-CM

## 2015-06-30 DIAGNOSIS — O09892 Supervision of other high risk pregnancies, second trimester: Secondary | ICD-10-CM

## 2015-06-30 DIAGNOSIS — Z3402 Encounter for supervision of normal first pregnancy, second trimester: Secondary | ICD-10-CM

## 2015-06-30 LAB — POCT URINALYSIS DIPSTICK
Bilirubin, UA: NEGATIVE
Blood, UA: NEGATIVE
Glucose, UA: NEGATIVE
Ketones, UA: NEGATIVE
LEUKOCYTES UA: NEGATIVE
Nitrite, UA: NEGATIVE
SPEC GRAV UA: 1.02
UROBILINOGEN UA: NEGATIVE
pH, UA: 6.5

## 2015-06-30 NOTE — Progress Notes (Signed)
ROB: Patient doing well. Does note swelling of feet when working or at rest.  Wears compression stockings.  S/p normal HR scan by [facility].  Note no further visits required with MFM.  RTC in 4 weeks.  To repeat thyroid studies at beginning of 3rd trimester.

## 2015-07-15 ENCOUNTER — Telehealth: Payer: Self-pay | Admitting: Obstetrics and Gynecology

## 2015-07-15 MED ORDER — FLUCONAZOLE 150 MG PO TABS: 150.0000 mg | ORAL_TABLET | Freq: Once | ORAL | Status: DC

## 2015-07-15 NOTE — Telephone Encounter (Signed)
Please inform pt that RX was sent in for diflucan, however if this does not resolve symptoms she needs to schedule an appt. Thanks

## 2015-07-15 NOTE — Telephone Encounter (Signed)
24 wks preganant/ has a yeast inf, mentioned it to Dr. Valentino Saxon at last visit. Seemed to clear and now its back bad/ itching, discharge. Employee pharmacy

## 2015-07-22 ENCOUNTER — Ambulatory Visit: Payer: Self-pay | Admitting: Physician Assistant

## 2015-07-22 ENCOUNTER — Encounter: Payer: Self-pay | Admitting: Physician Assistant

## 2015-07-22 VITALS — BP 120/70 | HR 76 | Temp 98.4°F

## 2015-07-22 DIAGNOSIS — R05 Cough: Secondary | ICD-10-CM

## 2015-07-22 DIAGNOSIS — B9789 Other viral agents as the cause of diseases classified elsewhere: Secondary | ICD-10-CM

## 2015-07-22 DIAGNOSIS — J988 Other specified respiratory disorders: Secondary | ICD-10-CM

## 2015-07-22 DIAGNOSIS — R059 Cough, unspecified: Secondary | ICD-10-CM

## 2015-07-22 LAB — POCT INFLUENZA A/B
Influenza A, POC: NEGATIVE
Influenza B, POC: NEGATIVE

## 2015-07-22 NOTE — Progress Notes (Signed)
S: C/o runny nose and congestion for 3 days, no fever, chills, cp/sob, v/d; mucus was green this am but clear throughout the day, cough is sporadic, cough just started yesterday, no body aches, just got flu vaccine a month ago due to being in 1st trimester of preg until then, has tested several people for flu 1/2 of which have been positive  Using otc meds: robitussin  O: PE: vitals wnl, nad, perrl eomi, normocephalic, tms dull, nasal mucosa red and swollen, throat injected, neck supple no lymph, lungs c t a, cv rrr, neuro intact, flu swab neg  A:  Acute viral uri   P: drink fluids, continue regular meds , use otc meds of choice, return if not improving in 5 days, return earlier if worsening , f/u with gyn if any abd or back pain

## 2015-07-27 ENCOUNTER — Ambulatory Visit (INDEPENDENT_AMBULATORY_CARE_PROVIDER_SITE_OTHER): Payer: 59 | Admitting: Obstetrics and Gynecology

## 2015-07-27 VITALS — BP 127/69 | HR 91 | Wt 160.6 lb

## 2015-07-27 DIAGNOSIS — Z3492 Encounter for supervision of normal pregnancy, unspecified, second trimester: Secondary | ICD-10-CM

## 2015-07-27 DIAGNOSIS — E038 Other specified hypothyroidism: Secondary | ICD-10-CM

## 2015-07-27 DIAGNOSIS — O09892 Supervision of other high risk pregnancies, second trimester: Secondary | ICD-10-CM

## 2015-07-27 DIAGNOSIS — Z141 Cystic fibrosis carrier: Secondary | ICD-10-CM

## 2015-07-27 DIAGNOSIS — Z3482 Encounter for supervision of other normal pregnancy, second trimester: Secondary | ICD-10-CM | POA: Diagnosis not present

## 2015-07-27 DIAGNOSIS — E034 Atrophy of thyroid (acquired): Secondary | ICD-10-CM

## 2015-07-27 LAB — POCT URINALYSIS DIPSTICK
Bilirubin, UA: NEGATIVE
Blood, UA: NEGATIVE
Glucose, UA: NEGATIVE
Ketones, UA: NEGATIVE
Leukocytes, UA: NEGATIVE
Nitrite, UA: NEGATIVE
Protein, UA: NEGATIVE
Spec Grav, UA: 1.01
Urobilinogen, UA: NEGATIVE
pH, UA: 7.5

## 2015-07-27 MED ORDER — RANITIDINE HCL 150 MG PO CAPS: 150.0000 mg | ORAL_CAPSULE | Freq: Every evening | ORAL | Status: DC

## 2015-07-28 ENCOUNTER — Encounter: Payer: 59 | Admitting: Obstetrics and Gynecology

## 2015-07-28 LAB — URINE CULTURE

## 2015-07-30 ENCOUNTER — Observation Stay
Admission: RE | Admit: 2015-07-30 | Discharge: 2015-07-30 | Disposition: A | Discharge status: Home / Self Care | Payer: 59 | Source: Other Acute Inpatient Hospital | Attending: Obstetrics and Gynecology | Admitting: Obstetrics and Gynecology

## 2015-07-30 ENCOUNTER — Telehealth: Payer: Self-pay | Admitting: Obstetrics and Gynecology

## 2015-07-30 DIAGNOSIS — O26892 Other specified pregnancy related conditions, second trimester: Secondary | ICD-10-CM | POA: Diagnosis not present

## 2015-07-30 DIAGNOSIS — Z3A26 26 weeks gestation of pregnancy: Secondary | ICD-10-CM | POA: Diagnosis not present

## 2015-07-30 DIAGNOSIS — O09892 Supervision of other high risk pregnancies, second trimester: Secondary | ICD-10-CM

## 2015-07-30 DIAGNOSIS — R1012 Left upper quadrant pain: Secondary | ICD-10-CM | POA: Diagnosis present

## 2015-07-30 DIAGNOSIS — Z141 Cystic fibrosis carrier: Secondary | ICD-10-CM

## 2015-07-30 LAB — COMPREHENSIVE METABOLIC PANEL
ALT: 27 U/L (ref 14–54)
AST: 23 U/L (ref 15–41)
Albumin: 3 g/dL — ABNORMAL LOW (ref 3.5–5.0)
Alkaline Phosphatase: 67 U/L (ref 38–126)
Anion gap: 6 (ref 5–15)
BILIRUBIN TOTAL: 0.6 mg/dL (ref 0.3–1.2)
BUN: 7 mg/dL (ref 6–20)
CO2: 24 mmol/L (ref 22–32)
CREATININE: 0.42 mg/dL — AB (ref 0.44–1.00)
Calcium: 8.4 mg/dL — ABNORMAL LOW (ref 8.9–10.3)
Chloride: 105 mmol/L (ref 101–111)
GFR calc Af Amer: >60 mL/min (ref >60)
Glucose, Bld: 74 mg/dL (ref 65–99)
POTASSIUM: 3.7 mmol/L (ref 3.5–5.1)
Sodium: 135 mmol/L (ref 135–145)
TOTAL PROTEIN: 6.2 g/dL — AB (ref 6.5–8.1)

## 2015-07-30 MED ORDER — ACETAMINOPHEN 500 MG PO TABS: 1000.0000 mg | ORAL_TABLET | Freq: Four times a day (QID) | ORAL | Status: DC | PRN

## 2015-07-30 NOTE — Telephone Encounter (Signed)
PT IS AT WORK AND HAVING UPPER LEFT NEAR BELLY BUTTON EXTREME PAIN. SHE SAID ITS NOT LIKE CONTRACTIONS. SHE IS 26 WK 3 DAYS. SHE SAID IT STOPS HER IN HER TRACKS AND SHE FEELS NAUSEATED. SHE SOUNDS PRETTY BAD. CALL AT WORK  [phone]

## 2015-07-30 NOTE — Telephone Encounter (Signed)
Called pt she states that she is having severe pain approx 2in below her left breast, and 1 and a half inches from her belly button. Pt states that she has been having this pain for approx one hour and . Pt states that she wants to be seen for evaluation. Advised pt that Dr.Cherry was out of office today, and our midwife would not be able to see her as she is out of the office for a delivery. Advised pt to go to L&D for triage. Pt states she will have to find coverage for her shift in order to go. Advised pt that my recommendation would be for to be seen.

## 2015-07-30 NOTE — Discharge Instructions (Signed)
Please drink plenty of water and rest.

## 2015-07-30 NOTE — Telephone Encounter (Signed)
Ok.  I will follow up with her on L&D.

## 2015-07-30 NOTE — Final Progress Note (Signed)
L&D OB Triage Note  HPI:  Paula FlavorsCrystal D Parmelee is a 35 y.o. 342P1001 female at 2937w3d. Estimated Date of Delivery: 11/02/15 who presents for complaints of LUQ pain x 1.5 hours.  Patient notes that she was at work when she felt a sudden sharp stabbing sensation under left rib, lasting ~ 10 seconds.  Notes that the pain is intermittent, with associated nausea.     ROS:  Review of Systems - Negative except LUQ pain, nausea.   Physical Exam:  Last menstrual period 01/16/2015. General appearance: alert and no distress Abdomen: soft, non-tender; bowel sounds normal; no masses,  no organomegaly Pelvic: deferred Extremities: extremities normal, atraumatic, no cyanosis or edema   FETAL SURVEILLANCE TESTING SUMMARY  INDICATIONS: patient reassurance and rule out uterine contractions  OBJECTIVE RESULTS: Fetal heart variability: moderate Fetal Heart Rate decelerations: Paula [name] Fetal Heart Rate accelerations: yes Baseline FHR: 155 bpm per minute Fetal Non-stress Test: reactive  Uterine irritability: no Uterine contractions: Paula [name]  Fetal surveillance: reassuring'  Labs:  Results for orders placed or performed during the hospital encounter of 07/30/15  Comprehensive metabolic panel  Result Value Ref Range   Sodium 135 135 - 145 mmol/L   Potassium 3.7 3.5 - 5.1 mmol/L   Chloride 105 101 - 111 mmol/L   CO2 24 22 - 32 mmol/L   Glucose, Bld 74 65 - 99 mg/dL   BUN 7 6 - 20 mg/dL   Creatinine, Ser 1.610.42 (L) 0.44 - 1.00 mg/dL   Calcium 8.4 (L) 8.9 - 10.3 mg/dL   Total Protein 6.2 (L) 6.5 - 8.1 g/dL   Albumin 3.0 (L) 3.5 - 5.0 g/dL   AST 23 15 - 41 U/L   ALT 27 14 - 54 U/L   Alkaline Phosphatase 67 38 - 126 U/L   Total Bilirubin 0.6 0.3 - 1.2 mg/dL   GFR calc non Af Amer >60 >60 mL/min   GFR calc Af Amer >60 >60 mL/min   Anion gap 6 5 - 15    Assessment:  35 y.o. G2P1001 at 5237w3d with:  1. LUQ pain   Plan:  1. Patient feeling better once in triage.  Given Tylenol 1000 mg. Very active  fetal movement noted, likely cause of pain.  Patient eating at time of exam, no further pain noted, no nausea. Labs wnl. Patient can d/c home.     Hildred LaserAnika Jamale Spangler, MD [facility]

## 2015-07-30 NOTE — OB Triage Note (Signed)
Recvd to OBS 4 with c/o abdominal pain while at work.  Changed to gown and to bed.  EFM applied.  Plan of care discussed and oriented to room.  Verbalized understanding.

## 2015-08-01 NOTE — Progress Notes (Signed)
ROB: Patient with no new complaints.  Still notes swelling of lower extremities. Discussed contraception postpartum.  Patient debating between BTL and IUD.  Handouts given on both. Unable to donate cord blood due to possible risk of infant with CF.  RTC in 2 weeks.    For Tdap, blood consents at next visit.

## 2015-08-02 DIAGNOSIS — Z3490 Encounter for supervision of normal pregnancy, unspecified, unspecified trimester: Secondary | ICD-10-CM | POA: Insufficient documentation

## 2015-08-12 ENCOUNTER — Encounter: Payer: Self-pay | Admitting: Obstetrics and Gynecology

## 2015-08-12 ENCOUNTER — Ambulatory Visit (INDEPENDENT_AMBULATORY_CARE_PROVIDER_SITE_OTHER): Payer: 59 | Admitting: Obstetrics and Gynecology

## 2015-08-12 VITALS — BP 124/73 | HR 80 | Wt 163.0 lb

## 2015-08-12 DIAGNOSIS — O09892 Supervision of other high risk pregnancies, second trimester: Secondary | ICD-10-CM

## 2015-08-12 DIAGNOSIS — Z3493 Encounter for supervision of normal pregnancy, unspecified, third trimester: Secondary | ICD-10-CM

## 2015-08-12 DIAGNOSIS — J069 Acute upper respiratory infection, unspecified: Secondary | ICD-10-CM

## 2015-08-12 DIAGNOSIS — E039 Hypothyroidism, unspecified: Secondary | ICD-10-CM

## 2015-08-12 DIAGNOSIS — Z141 Cystic fibrosis carrier: Secondary | ICD-10-CM

## 2015-08-12 DIAGNOSIS — Z23 Encounter for immunization: Secondary | ICD-10-CM | POA: Diagnosis not present

## 2015-08-12 DIAGNOSIS — O99282 Endocrine, nutritional and metabolic diseases complicating pregnancy, second trimester: Secondary | ICD-10-CM

## 2015-08-12 DIAGNOSIS — Z3483 Encounter for supervision of other normal pregnancy, third trimester: Secondary | ICD-10-CM

## 2015-08-12 DIAGNOSIS — Z3482 Encounter for supervision of other normal pregnancy, second trimester: Secondary | ICD-10-CM | POA: Diagnosis not present

## 2015-08-12 LAB — POCT URINALYSIS DIPSTICK
BILIRUBIN UA: NEGATIVE
Blood, UA: NEGATIVE
Glucose, UA: NEGATIVE
KETONES UA: NEGATIVE
Leukocytes, UA: NEGATIVE
NITRITE UA: NEGATIVE
Protein, UA: NEGATIVE
Spec Grav, UA: 1.025
Urobilinogen, UA: NEGATIVE
pH, UA: 6

## 2015-08-12 MED ORDER — TETANUS-DIPHTH-ACELL PERTUSSIS 5-2.5-18.5 LF-MCG/0.5 IM SUSP
0.5000 mL | Freq: Once | INTRAMUSCULAR | Status: AC
  Administered 2015-08-12: 0.5 mL via INTRAMUSCULAR

## 2015-08-12 NOTE — Progress Notes (Signed)
ROB: Complains of sinus infection, notes difficulty catching breath at night when sleeping (per husband).  Is using Netti pot.  Advised on Sudafed/Mucinex. For 28 week labs today, Tdap.  Blood consent signed.  Patient unable to do cord blood banking due to CF carrier (and spouse). Patient still debating contraception.  RTC.  Will repeat thyroid studies in April (last done in January). RTC in 2 weeks.

## 2015-08-13 LAB — HEMOGLOBIN AND HEMATOCRIT, BLOOD
HEMATOCRIT: 33.3 % — AB (ref 34.0–46.6)
HEMOGLOBIN: 11.5 g/dL (ref 11.1–15.9)

## 2015-08-13 LAB — GLUCOSE, 1 HOUR GESTATIONAL: Gestational Diabetes Screen: 119 mg/dL (ref 65–139)

## 2015-08-16 ENCOUNTER — Telehealth: Payer: Self-pay

## 2015-08-16 NOTE — Telephone Encounter (Signed)
-----   Message from Hildred LaserAnika Cherry, MD sent at 08/15/2015 12:11 PM EDT ----- Please inform patient that 28 week labs are normal.

## 2015-08-16 NOTE — Telephone Encounter (Signed)
Called pt no answer, LM informing pt of normal 28wk labs

## 2015-09-01 ENCOUNTER — Ambulatory Visit (INDEPENDENT_AMBULATORY_CARE_PROVIDER_SITE_OTHER): Payer: 59 | Admitting: Obstetrics and Gynecology

## 2015-09-01 ENCOUNTER — Encounter: Payer: Self-pay | Admitting: Obstetrics and Gynecology

## 2015-09-01 VITALS — BP 127/75 | HR 96 | Wt 164.4 lb

## 2015-09-01 DIAGNOSIS — Z141 Cystic fibrosis carrier: Secondary | ICD-10-CM

## 2015-09-01 DIAGNOSIS — Z3493 Encounter for supervision of normal pregnancy, unspecified, third trimester: Secondary | ICD-10-CM

## 2015-09-01 DIAGNOSIS — O34219 Maternal care for unspecified type scar from previous cesarean delivery: Secondary | ICD-10-CM

## 2015-09-01 DIAGNOSIS — O09892 Supervision of other high risk pregnancies, second trimester: Secondary | ICD-10-CM

## 2015-09-01 DIAGNOSIS — E038 Other specified hypothyroidism: Secondary | ICD-10-CM

## 2015-09-01 LAB — POCT URINALYSIS DIPSTICK
BILIRUBIN UA: NEGATIVE
Blood, UA: NEGATIVE
KETONES UA: NEGATIVE
LEUKOCYTES UA: NEGATIVE
Nitrite, UA: NEGATIVE
Urobilinogen, UA: NEGATIVE
pH, UA: 6

## 2015-09-01 NOTE — Progress Notes (Signed)
ROB: Patient notes several episodes of vomiting (projectile) over the past week, also associated with 1 day of diarrhea. 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  Has been taking Phenergan, which has helped.  +1 proteinuria noted today, but BPs normal.  Will continue to monitor.  May be due to recent gastroenteritis and decreased PO intake. RTC in 2 weeks.  To discuss mode of delivery next visit (patient with h/o prior C-section for CPD).  Will also need to repeat thyroid studies for 3rd trimester. Will need growth scan within next 2-3 weeks.

## 2015-09-03 DIAGNOSIS — O34219 Maternal care for unspecified type scar from previous cesarean delivery: Secondary | ICD-10-CM | POA: Insufficient documentation

## 2015-09-20 ENCOUNTER — Telehealth: Payer: Self-pay | Admitting: Obstetrics and Gynecology

## 2015-09-20 ENCOUNTER — Other Ambulatory Visit: Payer: Self-pay

## 2015-09-20 DIAGNOSIS — O34219 Maternal care for unspecified type scar from previous cesarean delivery: Secondary | ICD-10-CM

## 2015-09-20 NOTE — Telephone Encounter (Signed)
Paula [name] said Dr. Valentino Saxonherry had mentioned her haveing an US at 34 wks to see if the baby weighed less than 7 lbs b/c she wants to have a VBac. She is coming Thursday for her OB appt and wanted to see if Dr. Valentino Saxonherry would order the US.

## 2015-09-20 NOTE — Telephone Encounter (Signed)
Order placed for us

## 2015-09-21 ENCOUNTER — Telehealth: Payer: Self-pay | Admitting: *Deleted

## 2015-09-21 DIAGNOSIS — E039 Hypothyroidism, unspecified: Secondary | ICD-10-CM

## 2015-09-21 DIAGNOSIS — O99283 Endocrine, nutritional and metabolic diseases complicating pregnancy, third trimester: Principal | ICD-10-CM

## 2015-09-21 NOTE — Telephone Encounter (Signed)
Called pt no answer, LM for pt informing her of information below. Advised pt to come in for thyroid studies.

## 2015-09-21 NOTE — Telephone Encounter (Signed)
Pt calls and states that yesterday she was tacy, dizzy, and had nausea. States that today she is also dizzy, feels as though she may pass out. Pt states that her BP today has been elevated today 144/78, but now is 115/65. Pt states was her heart rate was 118-130 for 2hrs, but is now regular. Please advise.

## 2015-09-21 NOTE — Telephone Encounter (Signed)
Patients states that she is

## 2015-09-21 NOTE — Telephone Encounter (Signed)
Please see if patient feels as though she needs to be further evaluated and needs to come in to be seen, or if resting and adequate hydration at home would help.  Ensure that she is eating properly. I also know that she is due for another thyroid level check as well.

## 2015-09-23 ENCOUNTER — Ambulatory Visit (INDEPENDENT_AMBULATORY_CARE_PROVIDER_SITE_OTHER): Payer: 59

## 2015-09-23 ENCOUNTER — Encounter: Payer: Self-pay | Admitting: Obstetrics and Gynecology

## 2015-09-23 ENCOUNTER — Ambulatory Visit (INDEPENDENT_AMBULATORY_CARE_PROVIDER_SITE_OTHER): Payer: 59 | Admitting: Obstetrics and Gynecology

## 2015-09-23 VITALS — BP 114/75 | HR 89 | Wt 168.6 lb

## 2015-09-23 DIAGNOSIS — O34219 Maternal care for unspecified type scar from previous cesarean delivery: Secondary | ICD-10-CM | POA: Diagnosis not present

## 2015-09-23 DIAGNOSIS — E034 Atrophy of thyroid (acquired): Secondary | ICD-10-CM

## 2015-09-23 DIAGNOSIS — E038 Other specified hypothyroidism: Secondary | ICD-10-CM | POA: Diagnosis not present

## 2015-09-23 DIAGNOSIS — E039 Hypothyroidism, unspecified: Secondary | ICD-10-CM

## 2015-09-23 DIAGNOSIS — K219 Gastro-esophageal reflux disease without esophagitis: Secondary | ICD-10-CM

## 2015-09-23 DIAGNOSIS — O99283 Endocrine, nutritional and metabolic diseases complicating pregnancy, third trimester: Secondary | ICD-10-CM

## 2015-09-23 DIAGNOSIS — Z3493 Encounter for supervision of normal pregnancy, unspecified, third trimester: Secondary | ICD-10-CM

## 2015-09-23 LAB — POCT URINALYSIS DIPSTICK
BILIRUBIN UA: NEGATIVE
Blood, UA: NEGATIVE
Glucose, UA: NEGATIVE
KETONES UA: NEGATIVE
LEUKOCYTES UA: NEGATIVE
Nitrite, UA: NEGATIVE
PH UA: 7.5
SPEC GRAV UA: 1.01
Urobilinogen, UA: NEGATIVE

## 2015-09-23 NOTE — Progress Notes (Signed)
ROB: Patient notes feeling better today.  Had 2 episodes over past 2-3 days of feeling warm, flushed, palpitations, and emesis.  Denies fevers, chills, sick contacts, abdominal pain.  Episodes typically resolved after emesis occurred.  Is taking Phenergan at night.  Occasionally takes her Zantac.  Advised on daily use of Zantac.  Patient also desires to discuss possibility of VBAC.  Had last C-section for CPD, 7 lb fetus.  Growth scan today notes fetus at 5 lbs.  Discussed that these last weeks of pregnancy are when baby's weight increases most.  Will re-scan again at 38 weeks.  If fetus 7+ lbs, would still recommend repeat C-section. If fetus less than 7 lbs and cervix favorable, can consider TOLAC.  Will tentatively schedule C-section for 10/27/15. Thyroid studies ordered today.  RTC in 2 weeks. For 36 weeks labs at that time.

## 2015-09-24 ENCOUNTER — Telehealth: Payer: Self-pay

## 2015-09-24 LAB — THYROID PANEL WITH TSH
FREE THYROXINE INDEX: 1.7 (ref 1.2–4.9)
T3 UPTAKE RATIO: 19 % — AB (ref 24–39)
T4, Total: 8.7 ug/dL (ref 4.5–12.0)
TSH: 1.63 u[IU]/mL (ref 0.450–4.500)

## 2015-09-24 NOTE — Telephone Encounter (Signed)
-----   Message from Hildred LaserAnika Cherry, MD sent at 09/24/2015  9:50 AM EDT ----- Please inform patient that her thyroid studies returned normal.

## 2015-09-24 NOTE — Telephone Encounter (Signed)
Called pt, no answer, LM for pt informing her of normal labs.

## 2015-09-25 DIAGNOSIS — K219 Gastro-esophageal reflux disease without esophagitis: Secondary | ICD-10-CM | POA: Insufficient documentation

## 2015-09-29 ENCOUNTER — Observation Stay
Admission: EM | Admit: 2015-09-29 | Discharge: 2015-09-29 | Disposition: A | Discharge status: Home / Self Care | Payer: 59 | Attending: Obstetrics and Gynecology | Admitting: Obstetrics and Gynecology

## 2015-09-29 DIAGNOSIS — R110 Nausea: Secondary | ICD-10-CM | POA: Diagnosis present

## 2015-09-29 DIAGNOSIS — R009 Unspecified abnormalities of heart beat: Secondary | ICD-10-CM | POA: Diagnosis not present

## 2015-09-29 DIAGNOSIS — Z3A36 36 weeks gestation of pregnancy: Secondary | ICD-10-CM | POA: Insufficient documentation

## 2015-09-29 DIAGNOSIS — O9989 Other specified diseases and conditions complicating pregnancy, childbirth and the puerperium: Principal | ICD-10-CM | POA: Insufficient documentation

## 2015-09-29 DIAGNOSIS — R42 Dizziness and giddiness: Secondary | ICD-10-CM | POA: Insufficient documentation

## 2015-09-29 NOTE — Discharge Instructions (Signed)
Drink plenty of water, keep scheduled appointment

## 2015-09-29 NOTE — OB Triage Note (Signed)
[name] here with c/o "my heart rate went up, felt dizzy and sweaty and just didn't feel right". States this happened yesterday as well as Mon and Tues of last week.

## 2015-10-05 ENCOUNTER — Encounter: Payer: Self-pay | Admitting: Obstetrics and Gynecology

## 2015-10-05 ENCOUNTER — Ambulatory Visit (INDEPENDENT_AMBULATORY_CARE_PROVIDER_SITE_OTHER): Payer: 59 | Admitting: Obstetrics and Gynecology

## 2015-10-05 VITALS — BP 138/80 | HR 99 | Wt 169.0 lb

## 2015-10-05 DIAGNOSIS — Z113 Encounter for screening for infections with a predominantly sexual mode of transmission: Secondary | ICD-10-CM | POA: Diagnosis not present

## 2015-10-05 DIAGNOSIS — Z3685 Encounter for antenatal screening for Streptococcus B: Secondary | ICD-10-CM

## 2015-10-05 DIAGNOSIS — Z36 Encounter for antenatal screening of mother: Secondary | ICD-10-CM

## 2015-10-05 DIAGNOSIS — Z331 Pregnant state, incidental: Secondary | ICD-10-CM

## 2015-10-05 DIAGNOSIS — K219 Gastro-esophageal reflux disease without esophagitis: Secondary | ICD-10-CM

## 2015-10-05 DIAGNOSIS — Z7689 Persons encountering health services in other specified circumstances: Secondary | ICD-10-CM | POA: Diagnosis not present

## 2015-10-05 DIAGNOSIS — R002 Palpitations: Secondary | ICD-10-CM

## 2015-10-05 LAB — POCT URINALYSIS DIPSTICK
Bilirubin, UA: NEGATIVE
GLUCOSE UA: NEGATIVE
Ketones, UA: NEGATIVE
Leukocytes, UA: NEGATIVE
NITRITE UA: NEGATIVE
PROTEIN UA: NEGATIVE
RBC UA: NEGATIVE
SPEC GRAV UA: 1.015
UROBILINOGEN UA: 0.2
pH, UA: 6.5

## 2015-10-05 MED ORDER — PANTOPRAZOLE SODIUM 40 MG PO TBEC: 40.0000 mg | DELAYED_RELEASE_TABLET | Freq: Every day | ORAL | Status: DC

## 2015-10-05 NOTE — Progress Notes (Signed)
ROB- pt states she has been vomiting since 2:00 am, just doesn't feel well today, she is having cold sweats Cultures obtained today

## 2015-10-05 NOTE — Patient Instructions (Signed)
Heartburn During Pregnancy Heartburn is a burning sensation in the chest caused by stomach acid backing up into the esophagus. Heartburn is common in pregnancy because a certain hormone (progesterone) is released when a woman is pregnant. The progesterone hormone may relax the valve that separates the esophagus from the stomach. This allows acid to go up into the esophagus, causing heartburn. Heartburn may also happen in pregnancy because the enlarging uterus pushes up on the stomach, which pushes more acid into the esophagus. This is especially true in the later stages of pregnancy. Heartburn problems usually go away after giving birth. CAUSES  Heartburn is caused by stomach acid backing up into the esophagus. During pregnancy, this may result from various things, including:   The progesterone hormone.  Changing hormone levels.  The growing uterus pushing stomach acid upward.  Large meals.  Certain foods and drinks.  Exercise.  Increased acid production. SIGNS AND SYMPTOMS   Burning pain in the chest or lower throat.  Bitter taste in the mouth.  Coughing. DIAGNOSIS  Your health care provider will typically diagnose heartburn by taking a careful history of your concern. Blood tests may be done to check for a certain type of bacteria that is associated with heartburn. Sometimes, heartburn is diagnosed by prescribing a heartburn medicine to see if the symptoms improve. In some cases, a procedure called an endoscopy may be done. In this procedure, a tube with a light and a camera on the end (endoscope) is used to examine the esophagus and the stomach. TREATMENT  Treatment will vary depending on the severity of your symptoms. Your health care provider may recommend:  Over-the-counter medicines (antacids, acid reducers) for mild heartburn.  Prescription medicines to decrease stomach acid or to protect your stomach lining.  Certain changes in your diet.  Elevating the head of your bed  by putting blocks under the legs. This helps prevent stomach acid from backing up into the esophagus when you are lying down. HOME CARE INSTRUCTIONS   Only take over-the-counter or prescription medicines as directed by your health care provider.  Raise the head of your bed by putting blocks under the legs if instructed to do so by your health care provider. Sleeping with more pillows is not effective because it only changes the position of your head.  Do not exercise right after eating.  Avoid eating 2-3 hours before bed. Do not lie down right after eating.  Eat small meals throughout the day instead of three large meals.  Identify foods and beverages that make your symptoms worse and avoid them. Foods you may want to avoid include:  Peppers.  Chocolate.  High-fat foods, including fried foods.  Spicy foods.  Garlic and onions.  Citrus fruits, including oranges, grapefruit, lemons, and limes.  Food containing tomatoes or tomato products.  Mint.  Carbonated and caffeinated drinks.  Vinegar. SEEK MEDICAL CARE IF:  You have abdominal pain of any kind.  You feel burning in your upper abdomen or chest, especially after eating or lying down.  You have nausea and vomiting.  Your stomach feels upset after you eat. SEEK IMMEDIATE MEDICAL CARE IF:   You have severe chest pain that goes down your arm or into your jaw or neck.  You feel sweaty, dizzy, or light-headed.  You become short of breath.  You vomit blood.  You have difficulty or pain with swallowing.  You have bloody or black, tarry stools.  You have episodes of heartburn more than 3 times a week,   for more than 2 weeks. MAKE SURE YOU:  Understand these instructions.  Will watch your condition.  Will get help right away if you are not doing well or get worse.   This information is not intended to replace advice given to you by your health care provider. Make sure you discuss any questions you have with  your health care provider.   Document Released: 05/12/2000 Document Revised: 06/05/2014 Document Reviewed: 01/01/2013 Elsevier Interactive Patient Education 2016 Elsevier Inc.  

## 2015-10-05 NOTE — Progress Notes (Signed)
ROB-reports reflux worse and doesn't sound like Zantac is working- switched to protonix, and recommend elevating HOB on blocks; still desires TOLAC if goes into labor before 10/27/15, and has growth scan already scheduled for 10/19/15. Reports increased frequency of palpitations with activity- seen in L&D but HR was back to normal by then; feels dizzy and nauseated when it happens- HR up to 140s when it happens.- labs obtained

## 2015-10-06 ENCOUNTER — Other Ambulatory Visit: Payer: Self-pay | Admitting: Obstetrics and Gynecology

## 2015-10-06 LAB — COMPREHENSIVE METABOLIC PANEL
A/G RATIO: 1.6 (ref 1.2–2.2)
ALBUMIN: 3.5 g/dL (ref 3.5–5.5)
ALK PHOS: 119 IU/L — AB (ref 39–117)
ALT: 14 IU/L (ref 0–32)
AST: 17 IU/L (ref 0–40)
BILIRUBIN TOTAL: 0.5 mg/dL (ref 0.0–1.2)
BUN / CREAT RATIO: 12 (ref 9–23)
BUN: 6 mg/dL (ref 6–20)
CALCIUM: 9.2 mg/dL (ref 8.7–10.2)
CO2: 21 mmol/L (ref 18–29)
Chloride: 102 mmol/L (ref 96–106)
Creatinine, Ser: 0.52 mg/dL — ABNORMAL LOW (ref 0.57–1.00)
GFR calc Af Amer: 146 mL/min/{1.73_m2} (ref >59)
GFR calc non Af Amer: 127 mL/min/{1.73_m2} (ref >59)
GLUCOSE: 65 mg/dL (ref 65–99)
Globulin, Total: 2.2 g/dL (ref 1.5–4.5)
POTASSIUM: 4.1 mmol/L (ref 3.5–5.2)
SODIUM: 139 mmol/L (ref 134–144)
TOTAL PROTEIN: 5.7 g/dL — AB (ref 6.0–8.5)

## 2015-10-06 LAB — CBC
HEMATOCRIT: 32.2 % — AB (ref 34.0–46.6)
Hemoglobin: 10.6 g/dL — ABNORMAL LOW (ref 11.1–15.9)
MCH: 28.8 pg (ref 26.6–33.0)
MCHC: 32.9 g/dL (ref 31.5–35.7)
MCV: 88 fL (ref 79–97)
Platelets: 318 10*3/uL (ref 150–379)
RBC: 3.68 x10E6/uL — ABNORMAL LOW (ref 3.77–5.28)
RDW: 13.3 % (ref 12.3–15.4)
WBC: 13.4 10*3/uL — ABNORMAL HIGH (ref 3.4–10.8)

## 2015-10-06 LAB — GC/CHLAMYDIA PROBE AMP
Chlamydia trachomatis, NAA: NEGATIVE
NEISSERIA GONORRHOEAE BY PCR: NEGATIVE

## 2015-10-06 MED ORDER — FUSION PLUS PO CAPS: 1.0000 | ORAL_CAPSULE | Freq: Every day | ORAL | Status: DC

## 2015-10-07 ENCOUNTER — Telehealth: Payer: Self-pay | Admitting: *Deleted

## 2015-10-07 LAB — STREP GP B NAA: STREP GROUP B AG: NEGATIVE

## 2015-10-07 NOTE — Telephone Encounter (Signed)
-----   Message from Ben WheelerMelody N Shambley, [place] sent at 10/06/2015 12:09 PM EDT ----- Please let her know lab results- and she is a little anemic- I will send in order for fusion- for her to take one daily Monday thru Friday for remained of pregnancy. Liver enzymes were fine and all normal for pregnancy.

## 2015-10-07 NOTE — Telephone Encounter (Signed)
Notified pt she voiced understanding 

## 2015-10-07 NOTE — Telephone Encounter (Signed)
Patient stated that she got a call from the nurse yesterday about her lab results. Patient is returning the miss call. Patient is requesting a call back. Call back number is [phone]

## 2015-10-07 NOTE — Telephone Encounter (Signed)
Notified pt of results 

## 2015-10-09 NOTE — Final Progress Note (Signed)
L&D OB Triage Note  Madelyn FlavorsCrystal D Alcott is a 35 y.o. 242P1001 female at 4252w1d, EDD Estimated Date of Delivery: 11/02/15 who presented to triage for complaints of elevated heart rate, dizziness, and "not feeling right".  She was evaluated by the nurses with no significant findings.  However nurse noted concern for symptoms of anxiety. Vital signs stable. An NST was performed and has been reviewed by MD.   NST INTERPRETATION: Indications: patient reassurance  Mode: External Baseline Rate (A): 135 bpm Variability: Moderate Accelerations: 15 x 15 Decelerations: None     Contraction Frequency (min): 4-5  Impression: reactive   Plan: NST performed was reviewed and was found to be reactive. She was discharged home with bleeding/labor precautions.  Continue routine prenatal care. Follow up with OB/GYN as previously scheduled.     Hildred LaserAnika Everson Mott, MD  [facility]

## 2015-10-11 ENCOUNTER — Encounter: Payer: 59 | Admitting: Obstetrics and Gynecology

## 2015-10-12 ENCOUNTER — Encounter: Payer: 59 | Admitting: Obstetrics and Gynecology

## 2015-10-15 ENCOUNTER — Ambulatory Visit (INDEPENDENT_AMBULATORY_CARE_PROVIDER_SITE_OTHER): Payer: 59 | Admitting: Obstetrics and Gynecology

## 2015-10-15 ENCOUNTER — Encounter: Payer: Self-pay | Admitting: Obstetrics and Gynecology

## 2015-10-15 VITALS — BP 114/79 | HR 104 | Wt 173.0 lb

## 2015-10-15 DIAGNOSIS — Z1389 Encounter for screening for other disorder: Secondary | ICD-10-CM

## 2015-10-15 DIAGNOSIS — Z369 Encounter for antenatal screening, unspecified: Secondary | ICD-10-CM

## 2015-10-15 DIAGNOSIS — Z36 Encounter for antenatal screening of mother: Secondary | ICD-10-CM

## 2015-10-15 LAB — POCT URINALYSIS DIPSTICK
BILIRUBIN UA: NEGATIVE
GLUCOSE UA: NEGATIVE
Ketones, UA: NEGATIVE
Leukocytes, UA: NEGATIVE
Nitrite, UA: NEGATIVE
Protein, UA: NEGATIVE
RBC UA: NEGATIVE
SPEC GRAV UA: 1.015
Urobilinogen, UA: NEGATIVE
pH, UA: 6

## 2015-10-15 NOTE — Progress Notes (Signed)
ROB-reviewed negative cultures, recommend support hose

## 2015-10-19 ENCOUNTER — Encounter: Payer: Self-pay | Admitting: Obstetrics and Gynecology

## 2015-10-19 ENCOUNTER — Other Ambulatory Visit: Payer: Self-pay | Admitting: Obstetrics and Gynecology

## 2015-10-19 ENCOUNTER — Ambulatory Visit (INDEPENDENT_AMBULATORY_CARE_PROVIDER_SITE_OTHER): Payer: 59

## 2015-10-19 ENCOUNTER — Ambulatory Visit (INDEPENDENT_AMBULATORY_CARE_PROVIDER_SITE_OTHER): Payer: 59 | Admitting: Obstetrics and Gynecology

## 2015-10-19 VITALS — BP 120/73 | HR 84 | Wt 172.4 lb

## 2015-10-19 DIAGNOSIS — Z01818 Encounter for other preprocedural examination: Secondary | ICD-10-CM

## 2015-10-19 DIAGNOSIS — E034 Atrophy of thyroid (acquired): Secondary | ICD-10-CM | POA: Diagnosis not present

## 2015-10-19 DIAGNOSIS — Z3493 Encounter for supervision of normal pregnancy, unspecified, third trimester: Secondary | ICD-10-CM | POA: Diagnosis not present

## 2015-10-19 DIAGNOSIS — E038 Other specified hypothyroidism: Secondary | ICD-10-CM

## 2015-10-19 DIAGNOSIS — O4100X0 Oligohydramnios, unspecified trimester, not applicable or unspecified: Secondary | ICD-10-CM | POA: Insufficient documentation

## 2015-10-19 DIAGNOSIS — O34219 Maternal care for unspecified type scar from previous cesarean delivery: Secondary | ICD-10-CM

## 2015-10-19 DIAGNOSIS — O4103X0 Oligohydramnios, third trimester, not applicable or unspecified: Secondary | ICD-10-CM

## 2015-10-19 LAB — POCT URINALYSIS DIPSTICK
BILIRUBIN UA: NEGATIVE
Glucose, UA: NEGATIVE
Ketones, UA: NEGATIVE
Leukocytes, UA: NEGATIVE
NITRITE UA: NEGATIVE
PH UA: 6
PROTEIN UA: NEGATIVE
RBC UA: NEGATIVE
Spec Grav, UA: 1.02
UROBILINOGEN UA: 0.2

## 2015-10-19 NOTE — H&P (Signed)
Obstetric Preoperative History and Physical  Paula [name] is a 35 y.o. G2P1001 with IUP at [redacted]w[redacted]d presents for preoperative appointment for scheduled repeat cesarean section delivery on 10/27/2015 at 39.[redacted] weeks gestation. Prior C-section was for cephalopelvic disproportion. Current pregnancy is notable for borderline oligohydramnios with most recent AFI of 6.9 cm. Antepartum testing has been reassuring to date.  Prenatal Course Source of Care: [facility] Pregnancy complications or risks: Patient Active Problem List   Diagnosis Date Noted  . Oligohydramnios antepartum 10/19/2015  . Nausea 09/29/2015  . GERD (gastroesophageal reflux disease) 09/25/2015  . H/O cesarean section complicating pregnancy 09/03/2015  . Supervision of normal pregnancy 08/02/2015  . LUQ abdominal pain 07/30/2015  . Hypothyroid in pregnancy, antepartum 05/07/2015  . Cystic fibrosis carrier in second trimester, antepartum 05/03/2015  . Hypothyroidism 12/22/2014  . 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 PCO (polycystic ovaries) 12/22/2014  . Oligomenorrhea 12/22/2014  . DEPRESSION 01/17/2008  . COMMON MIGRAINE 01/17/2008  . ALLERGIC RHINITIS 01/17/2008  . ASTHMA 01/17/2008  . ECZEMA 01/17/2008  . BREAST MASS, RIGHT 12/27/2007  . NIPPLE DISCHARGE, BLOODY 12/27/2007   She plans to breastfeed She desires condoms for postpartum contraception.   Prenatal labs and studies: ABO, Rh: O/Positive/-- (11/02 1139) Antibody: Negative (11/02 1139) Rubella: <20.0 (11/02 1139) RPR: Non Reactive (11/02 1139)  HBsAg: Negative (11/02 1139)  HIV: Non Reactive (11/02 1139)  [id number] (05/09 1559) 1 hr Glucola  119 Genetic screening Panorama negative, female Anatomy US normal  Prenatal Transfer Tool  Maternal Diabetes: No Genetic Screening: Normal Maternal Ultrasounds/Referrals: Normal Fetal Ultrasounds or other Referrals:  None Maternal Substance Abuse:  No Significant Maternal  Medications:  Meds include: Other:  Synthroid 75 g a day; Protonix daily; iron; prenatal vitamins; Zyrtec; Diclegis Significant Maternal Lab Results: Lab values include: Group B Strep negative, Other:  TSH normal  Past Medical History  Diagnosis Date  . Oligomenorrhea 12/22/2014  . 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 Hypothyroidism   . BULIMIA 01/17/2008    Annotation: AGE 35 Qualifier: Diagnosis of  By: Lorrene Reid LPN, Burna Mortimer    . Cystic fibrosis carrier in second trimester, antepartum 2016    Past Surgical History  Procedure Laterality Date  . Cesarean section  04/19/2013    LTCS; CPD  . Tonsillectomy      OB History  Gravida Para Term Preterm AB SAB TAB Ectopic Multiple Living  # Outcome Date GA Lbr Len/2nd Weight Sex Delivery Anes PTL Lv  2 Current           1 Term 04/19/13 [redacted]w[redacted]d  7 lb 4 oz (3.289 kg) F CS-LTranv   Y    Obstetric Comments  LTCS: small statue, CPD.     Social History   Social History  . Marital Status: Married    Spouse Name: N/A  . Number of Children: N/A  . Years of Education: N/A   Occupational History  . ED tech [employer]   Social History Main Topics  . Smoking status: Never Smoker   . Smokeless tobacco: Never Used  . Alcohol Use: 0.0 oz/week    0 Standard drinks or equivalent per week  . Drug Use: No  . Sexual Activity:    Partners: Male    Birth Control/ Protection: None   Other Topics Concern  . Not on file   Social History Narrative    Family History  Problem Relation Age of Onset  .  Endometriosis Mother   . Heart disease Maternal Grandmother     valvular problem     (Not in a hospital admission)  Allergies  Allergen Reactions  . Iodine Anaphylaxis    Contrast   . Shellfish Allergy Hives  . Contrast Media [Iodinated Diagnostic Agents]     Review of Systems: Negative except for what is mentioned in HPI.  Physical Exam: LMP 01/16/2015 (Approximate) FHR by Doppler: 130 bpm GENERAL:  Well-developed, well-nourished female in no acute distress.  LUNGS: Clear to auscultation bilaterally.  HEART: Regular rate and rhythm. ABDOMEN: Soft, nontender, nondistended, gravid, well-healed Pfannenstiel incision. Estimated fetal weight 7 lbs. 9 oz. PELVIC: Deferred EXTREMITIES: Nontender, no edema, 2+ distal pulses.   Pertinent Labs/Studies:   Results for orders placed or performed in visit on 10/19/15 (from the past 72 hour(s))  POCT urinalysis dipstick     Status: None   Collection Time: 10/19/15  3:37 PM  Result Value Ref Range   Color, UA yellow    Clarity, UA clear    Glucose, UA neg    Bilirubin, UA neg    Ketones, UA neg    Spec Grav, UA 1.020    Blood, UA neg    pH, UA 6.0    Protein, UA neg    Urobilinogen, UA 0.2    Nitrite, UA neg    Leukocytes, UA Negative Negative    Assessment and Plan :Paula [name] is a 35 y.o. G2P1001 at 6939w0d being admitted  for scheduled cesarean section deliveryAt 39.[redacted] weeks gestation on 10/27/2015 . The patient is understanding of the planned procedure and is aware of and accepting of all surgical risks, including but not limited to: bleeding which may require transfusion or reoperation; infection which may require antibiotics; injury to bowel, bladder, ureters or other surrounding organs which may require repair; injury to the fetus; need for additional procedures including hysterectomy in the event of life-threatening complications; placental abnormalities wth subsequent pregnancies; incisional problems; blood clot disorders which may require blood thinners;, and other postoperative/anesthesia complications. The patient is in agreement with the proposed plan, and gives informed written consent for the procedure. All questions have been answered.  Kiaraliz Rafuse A. Beatris Sie Francesco, MD, F[facility]OG [facility]

## 2015-10-19 NOTE — Progress Notes (Signed)
Rob and growth scan. Vaginal area is dry.  Growth scan shows 61% percentile growth; AFI is 6.9. Twice weekly NSTs will be performed until repeat C-section on 10/27/2015. Preoperative H&P is completed today  NST-reactive; no significant variables; irregular contractions every 6-7 minutes

## 2015-10-20 ENCOUNTER — Ambulatory Visit (INDEPENDENT_AMBULATORY_CARE_PROVIDER_SITE_OTHER): Payer: 59 | Admitting: Obstetrics and Gynecology

## 2015-10-20 ENCOUNTER — Encounter: Payer: Self-pay | Admitting: Obstetrics and Gynecology

## 2015-10-20 VITALS — BP 107/71 | HR 83 | Wt 175.1 lb

## 2015-10-20 DIAGNOSIS — O26893 Other specified pregnancy related conditions, third trimester: Secondary | ICD-10-CM | POA: Diagnosis not present

## 2015-10-20 DIAGNOSIS — N949 Unspecified condition associated with female genital organs and menstrual cycle: Secondary | ICD-10-CM

## 2015-10-20 DIAGNOSIS — Z3493 Encounter for supervision of normal pregnancy, unspecified, third trimester: Secondary | ICD-10-CM

## 2015-10-20 DIAGNOSIS — O479 False labor, unspecified: Secondary | ICD-10-CM

## 2015-10-20 DIAGNOSIS — R102 Pelvic and perineal pain: Secondary | ICD-10-CM

## 2015-10-20 LAB — POCT URINALYSIS DIPSTICK
BILIRUBIN UA: NEGATIVE
Glucose, UA: NEGATIVE
Ketones, UA: NEGATIVE
Leukocytes, UA: NEGATIVE
NITRITE UA: NEGATIVE
PH UA: 6
PROTEIN UA: NEGATIVE
RBC UA: NEGATIVE
Spec Grav, UA: >=1.03
UROBILINOGEN UA: 0.2

## 2015-10-20 NOTE — Progress Notes (Signed)
OB work-in- ?? Ctx- no LOF-  Contractions irregular, occasionally as many as 5/hour. Good fetal movement. No gross rupture of membranes. Patient did report urinating last night.  NST: Indications-contractions NST interpretation-reactive; no significant decelerations; contractions irregular  ASSESSMENT: 1. Reactive NST 2. Prior cesarean section without evidence of active labor  PLAN: Return in 2 days as scheduled for NST Labor precautions given

## 2015-10-22 ENCOUNTER — Ambulatory Visit (INDEPENDENT_AMBULATORY_CARE_PROVIDER_SITE_OTHER): Payer: 59 | Admitting: Obstetrics and Gynecology

## 2015-10-22 VITALS — BP 114/58 | HR 85 | Wt 173.3 lb

## 2015-10-22 DIAGNOSIS — Z3493 Encounter for supervision of normal pregnancy, unspecified, third trimester: Secondary | ICD-10-CM

## 2015-10-22 LAB — POCT URINALYSIS DIPSTICK
Bilirubin, UA: NEGATIVE
Glucose, UA: NEGATIVE
KETONES UA: NEGATIVE
Leukocytes, UA: NEGATIVE
Nitrite, UA: NEGATIVE
PH UA: 7.5
RBC UA: NEGATIVE
SPEC GRAV UA: 1.025
UROBILINOGEN UA: 0.2

## 2015-10-22 NOTE — Progress Notes (Signed)
NONSTRESS TEST INTERPRETATION  INDICATIONS: oligohydramnios/contractions   FHR baseline: 135-140 RESULTS:Reactive COMMENTS:    PLAN: 1. Continue fetal kick counts twice a day. 2. Continue antepartum testing as scheduled-Biweekly 3.  Labor precautions   Darol Destinerystal Miller, CMA  Herold HarmsMartin A Hommer Cunliffe, MD  Note: This dictation was prepared with Dragon dictation along with smaller phrase technology. Any transcriptional errors that result from this process are unintentional.

## 2015-10-25 ENCOUNTER — Ambulatory Visit (HOSPITAL_COMMUNITY)
Admission: RE | Admit: 2015-10-25 | Discharge: 2015-10-25 | Disposition: A | Discharge status: Home / Self Care | Payer: 59 | Source: Ambulatory Visit | Attending: Obstetrics and Gynecology | Admitting: Obstetrics and Gynecology

## 2015-10-25 DIAGNOSIS — O4103X1 Oligohydramnios, third trimester, fetus 1: Secondary | ICD-10-CM | POA: Diagnosis not present

## 2015-10-25 DIAGNOSIS — M419 Scoliosis, unspecified: Secondary | ICD-10-CM | POA: Diagnosis not present

## 2015-10-25 DIAGNOSIS — Z141 Cystic fibrosis carrier: Secondary | ICD-10-CM | POA: Diagnosis not present

## 2015-10-25 DIAGNOSIS — O34219 Maternal care for unspecified type scar from previous cesarean delivery: Secondary | ICD-10-CM | POA: Diagnosis not present

## 2015-10-25 DIAGNOSIS — Z3493 Encounter for supervision of normal pregnancy, unspecified, third trimester: Secondary | ICD-10-CM | POA: Insufficient documentation

## 2015-10-25 DIAGNOSIS — Z3A39 39 weeks gestation of pregnancy: Secondary | ICD-10-CM | POA: Insufficient documentation

## 2015-10-25 DIAGNOSIS — O4103X0 Oligohydramnios, third trimester, not applicable or unspecified: Secondary | ICD-10-CM | POA: Diagnosis not present

## 2015-10-25 DIAGNOSIS — O34211 Maternal care for low transverse scar from previous cesarean delivery: Secondary | ICD-10-CM | POA: Diagnosis not present

## 2015-10-25 NOTE — Plan of Care (Signed)
Report to MD. Reactive NST. Pt may be discharged home with precautions for surgery Wednesday at 1300 per Dr. Jake Seatsefrrancesco. c Neveen Daponte RNC

## 2015-10-25 NOTE — Plan of Care (Signed)
G2P1001 EDC [facility]02/[facility]. Pt placed on EFM, given marker and instructed to mark fetal movement. Pt states infant moving well today.  Ellison Carwin Chirag Krueger RNC

## 2015-10-25 NOTE — Plan of Care (Signed)
Pt leaving department in stable condition. Has scheduled c/section Wednesday with Dr. Greggory Keenefrancesco. Pt to be in [facility] at 1100 for 1300 surgery. Pt agrees with plan. Leaving ambulatory following reactive nst. Per Dr. Valentino Saxonherry order. Ellison Carwin Asharia Lotter RNC

## 2015-10-26 ENCOUNTER — Ambulatory Visit (INDEPENDENT_AMBULATORY_CARE_PROVIDER_SITE_OTHER): Payer: 59 | Admitting: Obstetrics and Gynecology

## 2015-10-26 ENCOUNTER — Encounter: Payer: 59 | Admitting: Obstetrics and Gynecology

## 2015-10-26 ENCOUNTER — Encounter
Admission: RE | Admit: 2015-10-26 | Discharge: 2015-10-26 | Disposition: A | Discharge status: Home / Self Care | Payer: 59 | Source: Ambulatory Visit | Attending: Obstetrics and Gynecology | Admitting: Obstetrics and Gynecology

## 2015-10-26 ENCOUNTER — Encounter: Payer: Self-pay | Admitting: Obstetrics and Gynecology

## 2015-10-26 VITALS — BP 116/71 | HR 84 | Wt 174.7 lb

## 2015-10-26 DIAGNOSIS — O09893 Supervision of other high risk pregnancies, third trimester: Secondary | ICD-10-CM

## 2015-10-26 DIAGNOSIS — Z3493 Encounter for supervision of normal pregnancy, unspecified, third trimester: Secondary | ICD-10-CM

## 2015-10-26 DIAGNOSIS — Z141 Cystic fibrosis carrier: Secondary | ICD-10-CM | POA: Insufficient documentation

## 2015-10-26 HISTORY — DX: Dermatitis, unspecified: L30.9

## 2015-10-26 HISTORY — DX: Adverse effect of unspecified anesthetic, initial encounter: T41.45XA

## 2015-10-26 HISTORY — DX: Headache: R51

## 2015-10-26 HISTORY — DX: Headache, unspecified: R51.9

## 2015-10-26 HISTORY — DX: Unspecified asthma, uncomplicated: J45.909

## 2015-10-26 HISTORY — DX: Anemia, unspecified: D64.9

## 2015-10-26 HISTORY — DX: Allergic rhinitis, unspecified: J30.9

## 2015-10-26 HISTORY — DX: Major depressive disorder, single episode, unspecified: F32.9

## 2015-10-26 HISTORY — DX: Other specified postprocedural states: Z98.890

## 2015-10-26 HISTORY — DX: Depression, unspecified: F32.A

## 2015-10-26 HISTORY — DX: Gastro-esophageal reflux disease without esophagitis: K21.9

## 2015-10-26 HISTORY — DX: Nausea: R11.0

## 2015-10-26 HISTORY — DX: Other complications of anesthesia, initial encounter: T88.59XA

## 2015-10-26 HISTORY — DX: Nausea with vomiting, unspecified: R11.2

## 2015-10-26 LAB — POCT URINALYSIS DIPSTICK
Bilirubin, UA: NEGATIVE
Blood, UA: NEGATIVE
Glucose, UA: NEGATIVE
KETONES UA: NEGATIVE
Leukocytes, UA: NEGATIVE
Nitrite, UA: NEGATIVE
Urobilinogen, UA: 0.2
pH, UA: 6

## 2015-10-26 LAB — ABO/RH: ABO/RH(D): O POS

## 2015-10-26 LAB — CBC WITH DIFFERENTIAL/PLATELET
Band Neutrophils: 0 %
Basophils Absolute: 0 10*3/uL (ref 0–0.1)
Basophils Relative: 0 %
Blasts: 0 %
EOS PCT: 0 %
Eosinophils Absolute: 0 10*3/uL (ref 0–0.7)
HCT: 33.8 % — ABNORMAL LOW (ref 35.0–47.0)
HEMOGLOBIN: 11.4 g/dL — AB (ref 12.0–16.0)
LYMPHS ABS: 1.7 10*3/uL (ref 1.0–3.6)
LYMPHS PCT: 15 %
MCH: 30.1 pg (ref 26.0–34.0)
MCHC: 33.9 g/dL (ref 32.0–36.0)
MCV: 89 fL (ref 80.0–100.0)
MONOS PCT: 5 %
MYELOCYTES: 0 %
Metamyelocytes Relative: 0 %
Monocytes Absolute: 0.6 10*3/uL (ref 0.2–0.9)
NEUTROS ABS: 9.2 10*3/uL — AB (ref 1.4–6.5)
NEUTROS PCT: 80 %
NRBC: 0 /100{WBCs}
OTHER: 0 %
Platelets: 273 10*3/uL (ref 150–440)
Promyelocytes Absolute: 0 %
RBC: 3.8 MIL/uL (ref 3.80–5.20)
RDW: 15 % — ABNORMAL HIGH (ref 11.5–14.5)
WBC: 11.5 10*3/uL — AB (ref 3.6–11.0)

## 2015-10-26 NOTE — Patient Instructions (Addendum)
  Your procedure is scheduled on: 10/27/15  0530 am Report to ED   Remember: Instructions that are not followed completely may result in serious medical risk, up to and including death, or upon the discretion of your surgeon and anesthesiologist your surgery may need to be rescheduled.    ___x_ 1. Do not eat food or drink liquids after midnight. No gum chewing or hard candies.     __x__ 2. No Alcohol for 24 hours before or after surgery.   ____ 3. Bring all medications with you on the day of surgery if instructed.    _x___ 4. Notify your doctor if there is any change in your medical condition     (cold, fever, infections).     Do not wear jewelry, make-up, hairpins, clips or nail polish.  Do not wear lotions, powders, or perfumes. You may wear deodorant.  Do not shave 48 hours prior to surgery. Men may shave face and neck.  Do not bring valuables to the hospital.    [facility] is not responsible for any belongings or valuables.               Contacts, dentures or bridgework may not be worn into surgery.  Leave your suitcase in the car. After surgery it may be brought to your room.  For patients admitted to the hospital, discharge time is determined by your                treatment team.   Patients discharged the day of surgery will not be allowed to drive home.   Please read over the following fact sheets that you were given:   Surgical Site Infection Prevention   __x__ Take these medicines the morning of surgery with A SIP OF WATER:    1.levthyroxine  2.protonix   3.   4.  5.  6.  ____ Fleet Enema (as directed)   _x___ Use CHG Soap as directed  ____ Use inhalers on the day of surgery  ____ Stop metformin 2 days prior to surgery    ____ Take 1/2 of usual insulin dose the night before surgery and none on the morning of surgery.   ____ Stop Coumadin/Plavix/aspirin on  ____ Stop Anti-inflammatories on   ____ Stop supplements until after surgery.    ____ Bring C-Pap  to the hospital.

## 2015-10-26 NOTE — Pre-Procedure Instructions (Addendum)
WORKS IN ED AND EXPOSED TO BACTERIAL MENINGITIS 2 WEEKS AGO. GIVEN ROCEPHIN INJECTION THAN.[facility]

## 2015-10-26 NOTE — Progress Notes (Signed)
Contractions are regular. Patient is to undergo repeat cesarean section morning. Cord blood sampling as needed for cystic fibrosis testing (husband is carrier; mom is also carrier of atypical mutation; genetic testing is necessary for each of the 2 gene types in parents)

## 2015-10-27 ENCOUNTER — Inpatient Hospital Stay: Payer: 59 | Admitting: Certified Registered Nurse Anesthetist

## 2015-10-27 ENCOUNTER — Encounter
Admission: RE | Disposition: A | Discharge status: Home / Self Care | Payer: Self-pay | Source: Ambulatory Visit | Attending: Obstetrics and Gynecology

## 2015-10-27 ENCOUNTER — Inpatient Hospital Stay
Admission: RE | Admit: 2015-10-27 | Discharge: 2015-10-29 | DRG: 766 | Disposition: A | Discharge status: Home / Self Care | Payer: 59 | Source: Ambulatory Visit | Attending: Obstetrics and Gynecology | Admitting: Obstetrics and Gynecology

## 2015-10-27 DIAGNOSIS — O4103X0 Oligohydramnios, third trimester, not applicable or unspecified: Principal | ICD-10-CM | POA: Diagnosis present

## 2015-10-27 DIAGNOSIS — Z3A39 39 weeks gestation of pregnancy: Secondary | ICD-10-CM | POA: Diagnosis not present

## 2015-10-27 DIAGNOSIS — O34211 Maternal care for low transverse scar from previous cesarean delivery: Secondary | ICD-10-CM | POA: Diagnosis present

## 2015-10-27 DIAGNOSIS — G8918 Other acute postprocedural pain: Secondary | ICD-10-CM | POA: Diagnosis not present

## 2015-10-27 DIAGNOSIS — R1084 Generalized abdominal pain: Secondary | ICD-10-CM | POA: Diagnosis not present

## 2015-10-27 DIAGNOSIS — M419 Scoliosis, unspecified: Secondary | ICD-10-CM | POA: Diagnosis present

## 2015-10-27 DIAGNOSIS — Z141 Cystic fibrosis carrier: Secondary | ICD-10-CM

## 2015-10-27 DIAGNOSIS — Z98891 History of uterine scar from previous surgery: Secondary | ICD-10-CM

## 2015-10-27 DIAGNOSIS — O34219 Maternal care for unspecified type scar from previous cesarean delivery: Secondary | ICD-10-CM | POA: Diagnosis present

## 2015-10-27 DIAGNOSIS — Z3483 Encounter for supervision of other normal pregnancy, third trimester: Secondary | ICD-10-CM

## 2015-10-27 DIAGNOSIS — O09892 Supervision of other high risk pregnancies, second trimester: Secondary | ICD-10-CM

## 2015-10-27 LAB — TYPE AND SCREEN
ABO/RH(D): O POS
Antibody Screen: NEGATIVE
Extend sample reason: UNDETERMINED

## 2015-10-27 SURGERY — Surgical Case
Anesthesia: Spinal

## 2015-10-27 MED ORDER — OXYCODONE-ACETAMINOPHEN 5-325 MG PO TABS
2.0000 | ORAL_TABLET | ORAL | Status: DC | PRN
  Administered 2015-10-28 – 2015-10-29 (×3): 2 via ORAL
  Filled 2015-10-27 (×4): qty 2

## 2015-10-27 MED ORDER — FENTANYL CITRATE (PF) 100 MCG/2ML IJ SOLN
INTRAMUSCULAR | Status: DC | PRN
  Administered 2015-10-27: 15 ug via INTRATHECAL

## 2015-10-27 MED ORDER — CEFAZOLIN SODIUM-DEXTROSE 2-4 GM/100ML-% IV SOLN
2.0000 g | INTRAVENOUS | Status: AC
  Administered 2015-10-27: 2 g via INTRAVENOUS

## 2015-10-27 MED ORDER — LACTATED RINGERS IV SOLN
INTRAVENOUS | Status: DC
  Administered 2015-10-27: 19:00:00 via INTRAVENOUS

## 2015-10-27 MED ORDER — MEPERIDINE HCL 25 MG/ML IJ SOLN: 6.2500 mg | INTRAMUSCULAR | Status: DC | PRN

## 2015-10-27 MED ORDER — OXYTOCIN 40 UNITS IN LACTATED RINGERS INFUSION - SIMPLE MED
INTRAVENOUS | Status: AC
  Administered 2015-10-27: 40 [IU]
  Filled 2015-10-27: qty 1000

## 2015-10-27 MED ORDER — NALOXONE HCL 0.4 MG/ML IJ SOLN: 0.4000 mg | INTRAMUSCULAR | Status: DC | PRN

## 2015-10-27 MED ORDER — PHENYLEPHRINE HCL 10 MG/ML IJ SOLN
INTRAMUSCULAR | Status: DC | PRN
  Administered 2015-10-27: 200 ug via INTRAVENOUS
  Administered 2015-10-27 (×3): 100 ug via INTRAVENOUS
  Administered 2015-10-27 (×2): 200 ug via INTRAVENOUS
  Administered 2015-10-27 (×7): 100 ug via INTRAVENOUS
  Administered 2015-10-27: 200 ug via INTRAVENOUS
  Administered 2015-10-27 (×9): 100 ug via INTRAVENOUS
  Administered 2015-10-27: 200 ug via INTRAVENOUS

## 2015-10-27 MED ORDER — ONDANSETRON HCL 4 MG/2ML IJ SOLN
INTRAMUSCULAR | Status: DC | PRN
  Administered 2015-10-27 (×2): 4 mg via INTRAVENOUS

## 2015-10-27 MED ORDER — SENNOSIDES-DOCUSATE SODIUM 8.6-50 MG PO TABS
2.0000 | ORAL_TABLET | ORAL | Status: DC
  Administered 2015-10-28 – 2015-10-29 (×2): 2 via ORAL
  Filled 2015-10-27 (×2): qty 2

## 2015-10-27 MED ORDER — LORATADINE 10 MG PO TABS
10.0000 mg | ORAL_TABLET | Freq: Every day | ORAL | Status: DC
  Administered 2015-10-28: 10 mg via ORAL
  Filled 2015-10-27: qty 1

## 2015-10-27 MED ORDER — PROMETHAZINE HCL 25 MG RE SUPP
25.0000 mg | Freq: Four times a day (QID) | RECTAL | Status: DC | PRN
  Administered 2015-10-27: 25 mg via RECTAL
  Filled 2015-10-27 (×2): qty 1

## 2015-10-27 MED ORDER — LIDOCAINE 5 % EX PTCH
1.0000 | MEDICATED_PATCH | CUTANEOUS | Status: DC
  Administered 2015-10-28 – 2015-10-29 (×2): 1 via TRANSDERMAL
  Filled 2015-10-27 (×2): qty 1

## 2015-10-27 MED ORDER — KETOROLAC TROMETHAMINE 30 MG/ML IJ SOLN
30.0000 mg | Freq: Four times a day (QID) | INTRAMUSCULAR | Status: AC | PRN
  Administered 2015-10-27 (×2): 30 mg via INTRAVENOUS
  Filled 2015-10-27 (×2): qty 1

## 2015-10-27 MED ORDER — LACTATED RINGERS IV SOLN: INTRAVENOUS | Status: DC

## 2015-10-27 MED ORDER — ONDANSETRON HCL 4 MG/2ML IJ SOLN: 4.0000 mg | Freq: Three times a day (TID) | INTRAMUSCULAR | Status: DC | PRN

## 2015-10-27 MED ORDER — LEVOTHYROXINE SODIUM 75 MCG PO TABS
75.0000 ug | ORAL_TABLET | Freq: Every day | ORAL | Status: DC
  Administered 2015-10-28 – 2015-10-29 (×2): 75 ug via ORAL
  Filled 2015-10-27 (×2): qty 1

## 2015-10-27 MED ORDER — LIDOCAINE 5 % EX PTCH
MEDICATED_PATCH | CUTANEOUS | Status: AC
  Filled 2015-10-27: qty 1

## 2015-10-27 MED ORDER — SOD CITRATE-CITRIC ACID 500-334 MG/5ML PO SOLN
ORAL | Status: AC
  Filled 2015-10-27: qty 15

## 2015-10-27 MED ORDER — TETANUS-DIPHTH-ACELL PERTUSSIS 5-2.5-18.5 LF-MCG/0.5 IM SUSP: 0.5000 mL | Freq: Once | INTRAMUSCULAR | Status: DC

## 2015-10-27 MED ORDER — DEXAMETHASONE SODIUM PHOSPHATE 10 MG/ML IJ SOLN
INTRAMUSCULAR | Status: DC | PRN
  Administered 2015-10-27: 8 mg via INTRAVENOUS

## 2015-10-27 MED ORDER — DIPHENHYDRAMINE HCL 50 MG/ML IJ SOLN: 12.5000 mg | INTRAMUSCULAR | Status: DC | PRN

## 2015-10-27 MED ORDER — IBUPROFEN 800 MG PO TABS
800.0000 mg | ORAL_TABLET | Freq: Three times a day (TID) | ORAL | Status: DC
  Administered 2015-10-27 – 2015-10-29 (×6): 800 mg via ORAL
  Filled 2015-10-27 (×6): qty 1

## 2015-10-27 MED ORDER — EPHEDRINE SULFATE 50 MG/ML IJ SOLN
INTRAMUSCULAR | Status: DC | PRN
  Administered 2015-10-27 (×4): 5 mg via INTRAVENOUS

## 2015-10-27 MED ORDER — BUPIVACAINE IN DEXTROSE 0.75-8.25 % IT SOLN
INTRATHECAL | Status: DC | PRN
  Administered 2015-10-27: 1.5 mL via INTRATHECAL

## 2015-10-27 MED ORDER — SCOPOLAMINE 1 MG/3DAYS TD PT72
1.0000 | MEDICATED_PATCH | TRANSDERMAL | Status: DC
  Administered 2015-10-27: 1.5 mg via TRANSDERMAL
  Filled 2015-10-27: qty 1

## 2015-10-27 MED ORDER — PANTOPRAZOLE SODIUM 40 MG PO TBEC
40.0000 mg | DELAYED_RELEASE_TABLET | Freq: Every day | ORAL | Status: DC
  Administered 2015-10-27 – 2015-10-28 (×2): 40 mg via ORAL
  Filled 2015-10-27 (×2): qty 1

## 2015-10-27 MED ORDER — OXYTOCIN 40 UNITS IN LACTATED RINGERS INFUSION - SIMPLE MED: 2.5000 [IU]/h | INTRAVENOUS | Status: AC

## 2015-10-27 MED ORDER — NALOXONE HCL 2 MG/2ML IJ SOSY
1.0000 ug/kg/h | PREFILLED_SYRINGE | INTRAVENOUS | Status: DC | PRN
  Filled 2015-10-27: qty 2

## 2015-10-27 MED ORDER — OXYTOCIN 40 UNITS IN LACTATED RINGERS INFUSION - SIMPLE MED
INTRAVENOUS | Status: DC | PRN
  Administered 2015-10-27: 800 mL via INTRAVENOUS

## 2015-10-27 MED ORDER — FENTANYL CITRATE (PF) 100 MCG/2ML IJ SOLN: 25.0000 ug | INTRAMUSCULAR | Status: DC | PRN

## 2015-10-27 MED ORDER — MORPHINE SULFATE (PF) 2 MG/ML IV SOLN: 2.0000 mg | INTRAVENOUS | Status: DC | PRN

## 2015-10-27 MED ORDER — KETOROLAC TROMETHAMINE 30 MG/ML IJ SOLN: 30.0000 mg | Freq: Four times a day (QID) | INTRAMUSCULAR | Status: AC | PRN

## 2015-10-27 MED ORDER — COCONUT OIL OIL
1.0000 "application " | TOPICAL_OIL | Status: DC | PRN
  Administered 2015-10-28: 1 via TOPICAL
  Filled 2015-10-27: qty 120

## 2015-10-27 MED ORDER — LACTATED RINGERS IV SOLN
INTRAVENOUS | Status: DC
  Administered 2015-10-27 (×3): 1000 mL via INTRAVENOUS

## 2015-10-27 MED ORDER — CEFAZOLIN SODIUM-DEXTROSE 2-4 GM/100ML-% IV SOLN
INTRAVENOUS | Status: AC
  Filled 2015-10-27: qty 100

## 2015-10-27 MED ORDER — PRENATAL MULTIVITAMIN CH
1.0000 | ORAL_TABLET | Freq: Every day | ORAL | Status: DC
  Administered 2015-10-28 – 2015-10-29 (×2): 1 via ORAL
  Filled 2015-10-27 (×2): qty 1

## 2015-10-27 MED ORDER — MEASLES, MUMPS & RUBELLA VAC ~~LOC~~ INJ
0.5000 mL | INJECTION | Freq: Once | SUBCUTANEOUS | Status: AC
  Administered 2015-10-29: 0.5 mL via SUBCUTANEOUS
  Filled 2015-10-27 (×2): qty 0.5

## 2015-10-27 MED ORDER — ONDANSETRON HCL 4 MG/2ML IJ SOLN: 4.0000 mg | Freq: Once | INTRAMUSCULAR | Status: DC | PRN

## 2015-10-27 MED ORDER — FERROUS SULFATE 325 (65 FE) MG PO TABS
325.0000 mg | ORAL_TABLET | Freq: Two times a day (BID) | ORAL | Status: DC
  Filled 2015-10-27: qty 1

## 2015-10-27 MED ORDER — NALBUPHINE HCL 10 MG/ML IJ SOLN: 5.0000 mg | Freq: Once | INTRAMUSCULAR | Status: DC | PRN

## 2015-10-27 MED ORDER — MORPHINE SULFATE (PF) 0.5 MG/ML IJ SOLN
INTRAMUSCULAR | Status: DC | PRN
  Administered 2015-10-27: .1 mg via INTRATHECAL

## 2015-10-27 MED ORDER — DIPHENHYDRAMINE HCL 25 MG PO CAPS: 25.0000 mg | ORAL_CAPSULE | ORAL | Status: DC | PRN

## 2015-10-27 MED ORDER — OXYCODONE-ACETAMINOPHEN 5-325 MG PO TABS
1.0000 | ORAL_TABLET | ORAL | Status: DC | PRN
  Administered 2015-10-28 – 2015-10-29 (×2): 1 via ORAL
  Filled 2015-10-27: qty 1

## 2015-10-27 MED ORDER — NALBUPHINE HCL 10 MG/ML IJ SOLN: 5.0000 mg | INTRAMUSCULAR | Status: DC | PRN

## 2015-10-27 MED ORDER — LIDOCAINE 5 % EX PTCH
MEDICATED_PATCH | CUTANEOUS | Status: DC | PRN
  Administered 2015-10-27: 1 via TRANSDERMAL

## 2015-10-27 MED ORDER — RISAQUAD PO CAPS
1.0000 | ORAL_CAPSULE | Freq: Every day | ORAL | Status: DC
  Administered 2015-10-27 – 2015-10-28 (×2): 1 via ORAL
  Filled 2015-10-27 (×3): qty 1

## 2015-10-27 MED ORDER — ACETAMINOPHEN 325 MG PO TABS: 650.0000 mg | ORAL_TABLET | ORAL | Status: DC | PRN

## 2015-10-27 MED ORDER — DIPHENHYDRAMINE HCL 25 MG PO CAPS: 25.0000 mg | ORAL_CAPSULE | Freq: Four times a day (QID) | ORAL | Status: DC | PRN

## 2015-10-27 MED ORDER — SODIUM CHLORIDE 0.9% FLUSH: 3.0000 mL | INTRAVENOUS | Status: DC | PRN

## 2015-10-27 SURGICAL SUPPLY — 28 items
CANISTER SUCT 3000ML (MISCELLANEOUS) ×2 IMPLANT
CHLORAPREP W/TINT 26ML (MISCELLANEOUS) ×2 IMPLANT
DRSG TELFA 3X8 NADH (GAUZE/BANDAGES/DRESSINGS) ×2 IMPLANT
ELECT REM PT RETURN 9FT ADLT (ELECTROSURGICAL) ×2
ELECTRODE REM PT RTRN 9FT ADLT (ELECTROSURGICAL) ×1 IMPLANT
GAUZE SPONGE 4X4 12PLY STRL (GAUZE/BANDAGES/DRESSINGS) ×2 IMPLANT
GLOVE BIO SURGEON STRL SZ8 (GLOVE) ×2 IMPLANT
GOWN STRL REUS W/ TWL LRG LVL3 (GOWN DISPOSABLE) ×2 IMPLANT
GOWN STRL REUS W/ TWL XL LVL3 (GOWN DISPOSABLE) ×1 IMPLANT
GOWN STRL REUS W/TWL LRG LVL3 (GOWN DISPOSABLE) ×4
GOWN STRL REUS W/TWL XL LVL3 (GOWN DISPOSABLE) ×2
NS IRRIG 1000ML POUR BTL (IV SOLUTION) ×2 IMPLANT
PACK C SECTION AR (MISCELLANEOUS) ×2 IMPLANT
PAD DRESSING TELFA 3X8 NADH (GAUZE/BANDAGES/DRESSINGS) ×1 IMPLANT
PAD OB MATERNITY 4.3X12.25 (PERSONAL CARE ITEMS) ×2 IMPLANT
PAD PREP 24X41 OB/GYN DISP (PERSONAL CARE ITEMS) ×2 IMPLANT
STRAP SAFETY BODY (MISCELLANEOUS) ×2 IMPLANT
SUT CHROMIC 1-0 (SUTURE) ×3 IMPLANT
SUT CHROMIC GUT BROWN 0 54 (SUTURE) IMPLANT
SUT CHROMIC GUT BROWN 0 54IN (SUTURE) ×6
SUT MAXON ABS #0 GS21 30IN (SUTURE) ×2 IMPLANT
SUT MNCRL 3-0 VIOLET CT-1 (SUTURE) IMPLANT
SUT MONOCRYL 3-0 (SUTURE) ×1
SUT PLAIN GUT 0 (SUTURE) ×2 IMPLANT
SUT VIC AB 2-0 CT1 27 (SUTURE)
SUT VIC AB 2-0 CT1 36 (SUTURE) ×2 IMPLANT
SUT VIC AB 2-0 CT1 TAPERPNT 27 (SUTURE) ×2 IMPLANT
SUT VIC AB 4-0 KS 27 (SUTURE) ×1 IMPLANT

## 2015-10-27 NOTE — Op Note (Signed)
[name] PROCEDURE DATE: 10/27/2015  PREOPERATIVE DIAGNOSES: Intrauterine pregnancy at 616w1d weeks gestation; oligohydramnios and prior C/S; CF Carrier state  POSTOPERATIVE DIAGNOSES: The same and Viable female  PROCEDURE: Repeat Low Transverse Cesarean Section  ANESTHESIA:: Spinal ANESTHESIOLOGIST: Dr. Consuella Loserew Karenz  SURGEON:  Dr. Daphine DeutscherMartin A. Marcoantonio Legault ASSISTANT:: Dr. Valentino Saxonherry    INDICATIONS: Paula [name] is a 35 y.o. G2P2002 at 2016w1d here for cesarean section delivery due to the indications listed under preoperative diagnoses; please see preoperative note for further details.  The risks of cesarean section were discussed with the patient including but not limited to: bleeding which may require transfusion or reoperation; infection which may require antibiotics; injury to bowel, bladder, ureters or other surrounding organs; injury to the fetus; need for additional procedures including hysterectomy in the event of a life-threatening hemorrhage; placental abnormalities wth subsequent pregnancies, incisional problems, thromboembolic phenomenon and other postoperative/anesthesia complications. The patient concurred with the proposed plan, giving informed written consent for the procedure. All questions were addressed.  FINDINGS:  Viable female infant in cephalic presentation.  Apgars 8 and 9.  Clear amniotic fluid.  Intact placenta, three vessel cord.  Normal uterus, fallopian tubes and ovaries bilaterally.  I/O's: Total I/O In: 2150 [I.V.:2150] Out: -  500 mL EBL SPECIMENS:: Cord blood for CF Carrier testing ANTIBIOTIC PROPHYLAXIS: Ancef 2 grams COUNTS:  YES COMPLICATIONS: None immediate  PROCEDURE IN DETAIL: Patient was brought to the operating room where she was placed in the sitting position. Spinal anesthetic was introduced with some difficulty due to scoliosis. She was placed in the supine position with a right lateral hip roll in place. Foley catheter was inserted and was  draining clear yellow urine. The abdomen was prepped with ChloraPrep solution and draped in a sterile manner. After timeout and checking for adequate level of anesthesia the procedure was started. Pfannenstiel incision was made into the abdomen. The fascia was incised transversely and extended bilaterally with Mayo scissors.  The midline raphae was identified and separated and the peritoneum was entered. Bladder flap was created over lower uterine segment with sharp dissection. A small window was noted in LUS on rt side - 1 cm. A low transverse incision was made in the uterus, incorporating the window into the incision, and this was extended cephalad and caudad in standard fashion. Amniotic fluid was clear. The infant was delivered through vertex presentation and was noted to be active at birth. The umbilical cord was doubly clamped and cut after delayed cord clamping, and the infant was handed off to the resuscitating team. Cord blood sampling was obtained as well as cord blood for CF gene testing. Placenta was expressed from the uterus. Uterus was externalized onto the anterior abdominal wall and was cleared of all debris with laps. The uterine incision was closed in 1 layer using #1 chromic suture.The first layer was closed in a running locking manner. AAn additional figure of eight suture was placed centrally to optimize hemostasis.Peri Jefferson. Good hemostasis was noted. The uterus was then placed back into the abdominal pelvic cavity. Gutters were cleared of all debris with laps. The incision was then closed in layers. 0 Maxon was used on the fascia in a simple running manner. Subcutaneous tissues were not reapproximated. The skin was closed with a subcuticular stitch of 4-0 Monocryl. Steri-Strips were placed. A Lidoderm patch was applied for analgesia. The patient was then mobilized and taken to the recovery room in satisfactory condition.      Keavon Sensing A. Beatris Sie Francesco, MD, Evern CoreFACOG  [facility]

## 2015-10-27 NOTE — Anesthesia Preprocedure Evaluation (Signed)
Anesthesia Evaluation  Patient identified by MRN, date of birth, ID band Patient awake    Reviewed: Allergy & Precautions, H&P , NPO status , Patient's Chart, lab work & pertinent test results, reviewed documented beta blocker date and time   History of Anesthesia Complications (+) PONV and history of anesthetic complications (N/V for 24 hours post spinal, no headache or pruritis.)  Airway Mallampati: II  TM Distance: >3 FB Neck ROM: full    Dental no notable dental hx. (+) Teeth Intact   Pulmonary neg shortness of breath, asthma , neg sleep apnea, neg COPD, neg recent URI,    Pulmonary exam normal breath sounds clear to auscultation       Cardiovascular Exercise Tolerance: Good negative cardio ROS Normal cardiovascular exam Rhythm:regular Rate:Normal     Neuro/Psych PSYCHIATRIC DISORDERS (Depression and h/o bulimia) negative neurological ROS     GI/Hepatic Neg liver ROS, GERD  ,  Endo/Other  neg diabetesHypothyroidism   Renal/GU negative Renal ROS  negative genitourinary   Musculoskeletal   Abdominal   Peds  Hematology  (+) Blood dyscrasia, anemia ,   Anesthesia Other Findings Past Medical History:   Oligomenorrhea                                  12/22/2014    CPD (cephalo-pelvic disproportion)              12/22/2014    Hypothyroidism                                               BULIMIA                                         01/17/2008      Comment:Annotation: AGE 16 Qualifier: Diagnosis of  By:              Lorrene Reid LPN, Wanda     Cystic fibrosis carrier in second trimester, a* 2016         Asthma                                                         Comment:exercise induced   GERD (gastroesophageal reflux disease)                         Comment:chronic   Anemia                                                       Complication of anesthesia                                     Comment:vomited during last  c/s   PONV (postoperative nausea and vomiting)  Depression                                                   Nausea                                                       Headache                                                       Comment:h/o migraines   Rhinitis, allergic                                           Eczema                                                       Reproductive/Obstetrics (+) Pregnancy                             Anesthesia Physical Anesthesia Plan  ASA: II  Anesthesia Plan: Spinal   Post-op Pain Management:    Induction:   Airway Management Planned:   Additional Equipment:   Intra-op Plan:   Post-operative Plan:   Informed Consent: I have reviewed the patients History and Physical, chart, labs and discussed the procedure including the risks, benefits and alternatives for the proposed anesthesia with the patient or authorized representative who has indicated his/her understanding and acceptance.   Dental Advisory Given  Plan Discussed with: Anesthesiologist, CRNA and Surgeon  Anesthesia Plan Comments:         Anesthesia Quick Evaluation

## 2015-10-27 NOTE — Transfer of Care (Signed)
Immediate Anesthesia Transfer of Care Note  Patient: Paula [name]  Procedure(s) Performed: Procedure(s): REPEAT CESAREAN SECTION (N/A)  Patient Location: PACU  Anesthesia Type:Spinal  Level of Consciousness: awake, alert  and oriented  Airway & Oxygen Therapy: Patient Spontanous Breathing  Post-op Assessment: Report given to RN and Post -op Vital signs reviewed and stable  Post vital signs: Reviewed and stable  Last Vitals:  Filed Vitals:   10/27/15 0542  BP: 118/77  Pulse: 90  Temp: 36.7 C  Resp: 18    Last Pain: There were no vitals filed for this visit.       Complications: No apparent anesthesia complications

## 2015-10-27 NOTE — Anesthesia Procedure Notes (Signed)
Spinal Patient location during procedure: OR Start time: 10/27/2015 7:38 AM End time: 10/27/2015 8:03 AM Staffing Anesthesiologist: Martha Clan Resident/CRNA: Demetrius Charity Performed by: anesthesiologist and resident/CRNA  Preanesthetic Checklist Completed: patient identified, site marked, surgical consent, pre-op evaluation, timeout performed, IV checked, risks and benefits discussed and monitors and equipment checked Spinal Block Patient position: sitting Prep: Betadine Patient monitoring: heart rate, continuous pulse ox, blood pressure and cardiac monitor Approach: midline Location: L4-5 Injection technique: single-shot Needle Needle type: Whitacre and Introducer  Needle gauge: 24 G Needle length: 9 cm Additional Notes Negative paresthesia. Negative blood return. Positive free-flowing CSF. Expiration date of kit checked and confirmed. Patient tolerated procedure well, without complications.

## 2015-10-27 NOTE — H&P (View-Only) (Signed)
ROB- pt states she has been vomiting since 2:00 am, just doesn't feel well today, she is having cold sweats Cultures obtained today

## 2015-10-27 NOTE — Interval H&P Note (Signed)
History and Physical Interval Note:  10/27/2015 7:23 AM  Paula [name]  has presented today for surgery, with the diagnosis of prior c section  The various methods of treatment have been discussed with the patient and family. After consideration of risks, benefits and other options for treatment, the patient has consented to  Procedure(s): REPEAT CESAREAN SECTION (N/A) as a surgical intervention .  The patient's history has been reviewed, patient examined, no change in status, stable for surgery.  I have reviewed the patient's chart and labs.  Questions were answered to the patient's satisfaction.     Daphine DeutscherMartin A Creedon Danielski

## 2015-10-27 NOTE — OB Triage Note (Signed)
Pt to OR at 0730

## 2015-10-28 LAB — CBC
HCT: 28.2 % — ABNORMAL LOW (ref 35.0–47.0)
Hemoglobin: 9.5 g/dL — ABNORMAL LOW (ref 12.0–16.0)
MCH: 30.2 pg (ref 26.0–34.0)
MCHC: 33.6 g/dL (ref 32.0–36.0)
MCV: 89.9 fL (ref 80.0–100.0)
Platelets: 220 10*3/uL (ref 150–440)
RBC: 3.13 MIL/uL — ABNORMAL LOW (ref 3.80–5.20)
RDW: 15.7 % — AB (ref 11.5–14.5)
WBC: 13.4 10*3/uL — ABNORMAL HIGH (ref 3.6–11.0)

## 2015-10-28 MED ORDER — SIMETHICONE 80 MG PO CHEW: 80.0000 mg | CHEWABLE_TABLET | Freq: Four times a day (QID) | ORAL | Status: DC | PRN

## 2015-10-28 MED ORDER — BISACODYL 10 MG RE SUPP
10.0000 mg | Freq: Every day | RECTAL | Status: DC | PRN
  Administered 2015-10-28 (×2): 10 mg via RECTAL
  Filled 2015-10-28 (×2): qty 1

## 2015-10-28 NOTE — Anesthesia Postprocedure Evaluation (Signed)
Anesthesia Post Note  Patient: Paula [name]  Procedure(s) Performed: Procedure(s) (LRB): REPEAT CESAREAN SECTION (N/A)  Patient location during evaluation: Mother Baby Anesthesia Type: Spinal Level of consciousness: awake and alert and oriented Pain management: satisfactory to patient Vital Signs Assessment: post-procedure vital signs reviewed and stable Respiratory status: respiratory function stable Cardiovascular status: stable Postop Assessment: no headache, no backache, patient able to bend at knees, no signs of nausea or vomiting and adequate PO intake Anesthetic complications: no    Last Vitals:  Filed Vitals:   10/27/15 2329 10/28/15 0253  BP: 96/52 89/61  Pulse: 59 64  Temp: 36.7 C 36.8 C  Resp: 18 18    Last Pain:  Filed Vitals:   10/28/15 0308  PainSc: 0-No pain                 Clydene PughBeane, Winnie Barsky D

## 2015-10-28 NOTE — Progress Notes (Signed)
Subjective: Post Op Day 1 Day Post-Op: Cesarean Delivery Patient reports ambulating and Breastfeeding.    Objective: Vital signs in last 24 hours: Temp:  [97.4 F (36.3 C)-98.5 F (36.9 C)] 98.2 F (36.8 C) (06/01 0253) Pulse Rate:  [58-83] 64 (06/01 0253) Resp:  [14-24] 18 (06/01 0253) BP: (89-127)/(50-68) 89/61 mmHg (06/01 0253) SpO2:  [98 %-100 %] 98 % (06/01 0253)  Physical Exam:  General: alert, cooperative and appears stated age Lochia: appropriate Uterine Fundus: firm Incision: healing well, no significant drainage DVT Evaluation: Negative Homan's sign.   Recent Labs  10/26/15 1206 10/28/15 0612  HGB 11.4* 9.5*  HCT 33.8* 28.2*     Assessment/Plan: Status post Cesarean section. Doing well postoperatively.  Continue current care.  Cherina Dhillon N Steel Kerney 10/28/2015, 8:05 AM

## 2015-10-28 NOTE — Anesthesia Post-op Follow-up Note (Signed)
  Anesthesia Pain Follow-up Note  Patient: Paula FlavorsCrystal D Mundorf  Day #: 1  Date of Follow-up: 10/28/2015 Time: 7:13 AM  Last Vitals:  Filed Vitals:   10/27/15 2329 10/28/15 0253  BP: 96/52 89/61  Pulse: 59 64  Temp: 36.7 C 36.8 C  Resp: 18 18    Level of Consciousness: alert  Pain: mild  Side Effects:None  Catheter Site Exam: site not evaluated  Plan: D/C from anesthesia care  Clydene PughBeane, Hitomi Slape D

## 2015-10-28 NOTE — Progress Notes (Signed)
POD 1 s/p Repeat C/S   S: Lots of gas; no flatus. No eructation, nausea or vomiting. O: BP 112/65 mmHg  Pulse 60  Temp(Src) 98.9 F (37.2 C) (Oral)  Resp 18  Ht 4' 10.5" (1.486 m)  Wt 174 lb 14.4 oz (79.334 kg)  BMI 35.93 kg/m2  SpO2 98%  LMP 01/16/2015 (Approximate)  Breastfeeding? Unknown Abd- distended with tympany; incision C/D/I CBC Latest Ref Rng 10/28/2015 10/26/2015 10/05/2015  WBC 3.6 - 11.0 K/uL 13.4(H) 11.5(H) 13.4(H)  Hemoglobin 12.0 - 16.0 g/dL 1.6(X9.5(L) 11.4(L) -  Hematocrit 35.0 - 47.0 % 28.2(L) 33.8(L) 32.2(L)  Platelets 150 - 440 K/uL 220 273 318   A: Stable, s/p Repeat C/S on POD#1 P: Dulcolox suppository. Continue PO care as planned.  Herold HarmsMartin A Metztli Sachdev, MD

## 2015-10-29 MED ORDER — IBUPROFEN 800 MG PO TABS: 800.0000 mg | ORAL_TABLET | Freq: Three times a day (TID) | ORAL | Status: DC

## 2015-10-29 MED ORDER — VITAMIN D3 125 MCG (5000 UT) PO CAPS: 1.0000 | ORAL_CAPSULE | Freq: Every day | ORAL | Status: DC

## 2015-10-29 MED ORDER — HYDROCODONE-ACETAMINOPHEN 5-325 MG PO TABS: 1.0000 | ORAL_TABLET | Freq: Four times a day (QID) | ORAL | Status: DC | PRN

## 2015-10-29 MED ORDER — MAGNESIUM HYDROXIDE 400 MG/5ML PO SUSP
15.0000 mL | Freq: Once | ORAL | Status: AC
  Administered 2015-10-29: 15 mL via ORAL
  Filled 2015-10-29: qty 30

## 2015-10-29 NOTE — Progress Notes (Signed)
Patient understands all discharge instructions and the need to make follow up appointments. Patient discharge via wheelchair with auxillary. 

## 2015-10-29 NOTE — Discharge Summary (Signed)
Obstetric Discharge Summary Reason for Admission: cesarean section Prenatal Procedures: ultrasound Intrapartum Procedures: cesarean: low cervical, transverse Postpartum Procedures: Rubella Ig Complications-Operative and Postpartum: none HEMOGLOBIN  Date Value Ref Range Status  10/28/2015 9.5* 12.0 - 16.0 g/dL Final   HGB  Date Value Ref Range Status  06/03/2013 13.3 12.0-16.0 g/dL Final   HCT  Date Value Ref Range Status  10/28/2015 28.2* 35.0 - 47.0 % Final  06/03/2013 39.5 35.0-47.0 % Final   HEMATOCRIT  Date Value Ref Range Status  10/05/2015 32.2* 34.0 - 46.6 % Final    Physical Exam:  General: alert, cooperative, appears stated age, fatigued and mildly obese Lochia: appropriate Uterine Fundus: firm Incision: healing well, no significant drainage DVT Evaluation: Negative Homan's sign.  Discharge Diagnoses: Term Pregnancy-delivered and repeat LTCS  Discharge Information: Date: 10/29/2015 Activity: pelvic rest Diet: routine Medications: PNV, Tylenol #3, Ibuprofen, Colace, Iron and vitamin D3 Condition: stable Instructions: refer to practice specific booklet Discharge to: home   Newborn Data: Live born female "Autum Sheilah PigeonRae" Birth Weight: 7 lb 11.1 oz (3490 g) APGAR: 10, 10  Home with mother.  Melody N Shambley 10/29/2015, 8:27 AM

## 2015-11-03 ENCOUNTER — Other Ambulatory Visit: Payer: Self-pay | Admitting: Obstetrics and Gynecology

## 2015-11-03 ENCOUNTER — Encounter: Payer: Self-pay | Admitting: Obstetrics and Gynecology

## 2015-11-03 ENCOUNTER — Ambulatory Visit (INDEPENDENT_AMBULATORY_CARE_PROVIDER_SITE_OTHER): Payer: 59 | Admitting: Obstetrics and Gynecology

## 2015-11-03 VITALS — BP 134/63 | HR 76 | Ht 59.0 in | Wt 170.2 lb

## 2015-11-03 DIAGNOSIS — Z98891 History of uterine scar from previous surgery: Secondary | ICD-10-CM

## 2015-11-03 DIAGNOSIS — M7918 Myalgia, other site: Secondary | ICD-10-CM

## 2015-11-03 DIAGNOSIS — Z09 Encounter for follow-up examination after completed treatment for conditions other than malignant neoplasm: Secondary | ICD-10-CM

## 2015-11-03 DIAGNOSIS — O34219 Maternal care for unspecified type scar from previous cesarean delivery: Secondary | ICD-10-CM

## 2015-11-03 DIAGNOSIS — M791 Myalgia: Secondary | ICD-10-CM

## 2015-11-03 NOTE — Patient Instructions (Signed)
1. Heating pad to lower back 2. Local massage 3. Continue with Tylenol and Motrin for pain 4. Begin pelvic tilt and core exercises in 1 week 5. Return in 5 weeks for final postop check

## 2015-11-03 NOTE — Progress Notes (Signed)
Chief complaint: 1. One week postop check 2. Status post repeat cesarean section  Patient is doing reasonably well. She is breast-feeding. The bowel and bladder function are normal. She is having some headaches that are not positional. She cannot lie flat stool in bed as headaches get worse. She states that with 2 pillows. The headaches are not exacerbated with sitting or standing. Haydn is breast-feeding. She is also complaining of low back discomfort as well as upper back discomfort.  OBJECTIVE: BP 134/63 mmHg  Pulse 76  Ht 4\' 11"  (1.499 m)  Wt 170 lb 3.2 oz (77.202 kg)  BMI 34.36 kg/m2  Breastfeeding? Yes Well-appearing female in no acute distress Neck-mild trapezius tenderness and increased tone Abdomen: Well approximated Pfannenstiel incision; Steri-Strips are removed Back: No significant spinal tenderness identified; no lesions in the lower spine spine Extremities: 2+ edema  ASSESSMENT: 1. One week status post repeat C-section 2. Mild non-positional headaches and upper and lower back stiffness, possibly related to breast-feeding positional changes and postural positional changes  PLAN: 1. Continue Motrin and Tylenol 2. Local massage for upper and lower back 3. Heating pad to lower back 4. Pelvic tilt and core exercises may be started in 1 week to help with lower back musculature stabilizing spine 5. Return for 6 week postpartum check and ParaGard IUD insertion  Herold HarmsMartin A Niemah Schwebke, MD  Note: This dictation was prepared with Dragon dictation along with smaller phrase technology. Any transcriptional errors that result from this process are unintentional.

## 2015-11-04 ENCOUNTER — Ambulatory Visit: Payer: 59 | Admitting: Obstetrics and Gynecology

## 2015-11-05 ENCOUNTER — Telehealth: Payer: Self-pay | Admitting: *Deleted

## 2015-11-05 NOTE — Telephone Encounter (Signed)
Pt aware ok to take bath. No issues with incision. Back pain tolerable with motrin and tylenol. Will start core exercised next week.

## 2015-11-05 NOTE — Telephone Encounter (Signed)
Patient called and wanted to know when she could take a bath in the tub. Patient is complaining of back pain.  Patient had her C-section on 10-27-15 and Dr. Tommi Rumpse took the steri strips off her incision on the 11/03/15. The patient is requesting a call back. Her number is  [phone].

## 2015-11-15 ENCOUNTER — Telehealth: Payer: Self-pay | Admitting: Obstetrics and Gynecology

## 2015-11-15 NOTE — Telephone Encounter (Signed)
Dr Tommi Rumpsde did c-section on May 31. She wants to know if she can run as in exercise.

## 2015-11-16 NOTE — Telephone Encounter (Signed)
Pt aware to exercise as tolerated.

## 2015-11-22 NOTE — Final Progress Note (Signed)
L&D OB Triage Note  SUBJECTIVE Paula [name] is a 35 y.o. [id number] female at 2146w1d, EDD Estimated Date of Delivery: 11/02/15 who presented to triage with complaints of Low AFI. Has Repeat C/S scheduled..    OBJECTIVE Nursing Evaluation: Temp(Src) 98.2 F (36.8 C) (Oral)  Ht 4\' 10"  (1.473 m)  Wt 172 lb 14.4 oz (78.427 kg)  BMI 36.15 kg/m2  LMP 01/16/2015 (Approximate) no significant findings suspicious for labor  NST was performed and has been reviewed by me.  NST INTERPRETATION: Indications: oligohydramnios  Mode: External Baseline Rate (A): 130 bpm Variability: Moderate Accelerations: 15 x 15 Decelerations: None     Contraction Frequency (min): irritability noted  ASSESSMENT Impression:  1. Pregnancy:  [name] at 7046w1d , EDD Estimated Date of Delivery: 11/02/15 2.  Reactive NST  PLAN 1. Reassurance given 2. Discharge home with bleeding/labor precautions.  3. Continue routine prenatal care.   Herold HarmsMartin A Shonda Mandarino, MD

## 2015-11-25 ENCOUNTER — Telehealth: Payer: Self-pay | Admitting: Obstetrics and Gynecology

## 2015-11-25 NOTE — Telephone Encounter (Signed)
Telephone call to Ms. Ozanich to notify her about the results of CF testing on her newborn infant, Paula [name] (dob 10/27/2013).  Results of the testing for the known familial mutations (delta F508 and 489+3A>G) are negative for [name].  This means that she does not carry either of the known mutations in the family.  With the current testing, we cannot rule out all other CF mutations which may occur in the general population.  In addition, the newborn screening results on [name] were also normal.  We also contacted [facility] to make sure they are aware of these results.  If there are any questions or concerns, we may be reached at [phone].  Cherly Andersoneborah F. Collin Hendley, MS, CGC

## 2015-12-03 ENCOUNTER — Encounter: Payer: Self-pay | Admitting: Obstetrics and Gynecology

## 2015-12-03 ENCOUNTER — Ambulatory Visit (INDEPENDENT_AMBULATORY_CARE_PROVIDER_SITE_OTHER): Payer: 59 | Admitting: Obstetrics and Gynecology

## 2015-12-03 VITALS — BP 118/92 | HR 88 | Ht <= 58 in | Wt 148.4 lb

## 2015-12-03 DIAGNOSIS — Z30430 Encounter for insertion of intrauterine contraceptive device: Secondary | ICD-10-CM

## 2015-12-03 MED ORDER — METOCLOPRAMIDE HCL 10 MG PO TABS: 10.0000 mg | ORAL_TABLET | Freq: Two times a day (BID) | ORAL | Status: DC

## 2015-12-03 NOTE — Progress Notes (Signed)
  Subjective:     Paula [name] is a 35 y.o. female who presents for a postpartum visit. She is 6 weeks postpartum following a low cervical transverse Cesarean section. I have fully reviewed the prenatal and intrapartum course. The delivery was at 39 gestational weeks. Outcome: repeat cesarean section, low transverse incision. Anesthesia: spinal. Postpartum course has been complicated by anxiety. Baby's course has been uncomplicated. Baby is feeding by breast. Bleeding no bleeding. Bowel function is normal. Bladder function is normal. Patient is not sexually active. Contraception method is abstinence. Postpartum depression screening: negative.  The following portions of the patient's history were reviewed and updated as appropriate: allergies, current medications, past family history, past medical history, past social history, past surgical history and problem list.  Review of Systems Pertinent items noted in HPI and remainder of comprehensive ROS otherwise negative.   Objective:    BP 118/92 mmHg  Pulse 88  Ht 4\' 10"  (1.473 m)  Wt 148 lb 6.4 oz (67.314 kg)  BMI 31.02 kg/m2  Breastfeeding? Yes  General:  alert, cooperative and appears stated age   Breasts:  inspection negative, no nipple discharge or bleeding, no masses or nodularity palpable  Lungs: clear to auscultation bilaterally  Heart:  regular rate and rhythm, S1, S2 normal, no murmur, click, rub or gallop  Abdomen: soft, non-tender; bowel sounds normal; no masses,  no organomegaly   Vulva:  normal  Vagina: normal vagina, no discharge, exudate, lesion, or erythema  Cervix:  multiparous appearance  Corpus: normal size, contour, position, consistency, mobility, non-tender  Adnexa:  no mass, fullness, tenderness  Rectal Exam: Not performed.        Assessment:     6 weeks  postpartum exam. Pap smear not done at today's visit.   Plan:    1. Contraception: IUD 2. breastfeeding 3. Follow up in: 3 months or as needed.      Paula [name] is a 35 y.o. year old 112P2002 Caucasian female who presents for placement of a paragard IUD.  No LMP recorded. BP 118/92 mmHg  Pulse 88  Ht 4\' 10"  (1.473 m)  Wt 148 lb 6.4 oz (67.314 kg)  BMI 31.02 kg/m2  Breastfeeding? Yes Last sexual intercourse was before delivery of infant.  The risks and benefits of the method and placement have been thouroughly reviewed with the patient and all questions were answered.  Specifically the patient is aware of failure rate of 05/998, expulsion of the IUD and of possible perforation.  The patient is aware of irregular bleeding due to the method and understands the incidence of irregular bleeding diminishes with time.  Signed copy of informed consent in chart.   Time out was performed.  A pederson speculum was placed in the vagina.  The cervix was visualized, prepped using Betadine, and grasped with a single tooth tenaculum. The uterus was found to be retroflexed and it sounded to 8 cm.  Paragard IUD placed per manufacturer's recommendations.   The strings were trimmed to 3 cm.  The patient was given post procedure instructions, including signs and symptoms of infection and to check for the strings after each menses or each month, and refraining from intercourse or anything in the vagina for 3 days.     Paula [name], CNM

## 2015-12-03 NOTE — Patient Instructions (Signed)
  Place postpartum visit patient instructions here.  

## 2015-12-28 ENCOUNTER — Telehealth: Payer: Self-pay | Admitting: Obstetrics and Gynecology

## 2015-12-28 NOTE — Telephone Encounter (Signed)
Patient called stating she is having problems with her FMLA. We filled out her paperwork for 12 weeks of leave but [employer] is telling her she has to return to work after 8 weeks,they have even emailed her boss. Can you look into this. Thanks

## 2015-12-29 NOTE — Telephone Encounter (Signed)
umm I remember discussing 12 weeks, but insurance would have ultimate say

## 2016-02-22 ENCOUNTER — Telehealth: Payer: Self-pay | Admitting: Obstetrics and Gynecology

## 2016-02-22 NOTE — Telephone Encounter (Signed)
Pt called and would like for you to call her back.

## 2016-02-22 NOTE — Telephone Encounter (Signed)
Called pt we discussed her sx, she has appt 02/23/16

## 2016-02-23 ENCOUNTER — Ambulatory Visit (INDEPENDENT_AMBULATORY_CARE_PROVIDER_SITE_OTHER): Payer: 59 | Admitting: Obstetrics and Gynecology

## 2016-02-23 ENCOUNTER — Encounter: Payer: Self-pay | Admitting: Obstetrics and Gynecology

## 2016-02-23 VITALS — BP 128/92 | HR 98 | Ht <= 58 in | Wt 147.1 lb

## 2016-02-23 DIAGNOSIS — A499 Bacterial infection, unspecified: Secondary | ICD-10-CM

## 2016-02-23 DIAGNOSIS — N760 Acute vaginitis: Secondary | ICD-10-CM | POA: Diagnosis not present

## 2016-02-23 DIAGNOSIS — B9689 Other specified bacterial agents as the cause of diseases classified elsewhere: Secondary | ICD-10-CM

## 2016-02-23 MED ORDER — METRONIDAZOLE 500 MG PO TABS: 500.0000 mg | ORAL_TABLET | Freq: Two times a day (BID) | ORAL | 0 refills | Status: DC

## 2016-02-23 NOTE — Patient Instructions (Signed)

## 2016-02-23 NOTE — Progress Notes (Signed)
Subjective:     Patient ID: Paula [name], female   DOB: 1981-05-22, 35 y.o.   MRN: [id number]  HPI Reports increased vaginal d/c x 2 days, no odor or itching just increased d/c  Review of Systems See above    Objective:   Physical Exam A&O x4 Well groomed female in no distressed Pelvic exam: normal external genitalia, vulva, vagina, cervix, uterus and adnexa, WET MOUNT done - results: negative for pathogens, normal epithelial cells, KOH done, clue cells, excessive bacteria.    Assessment:     BV    Plan:     Flagyl bid x 5 days Reassured.  Melody BrooksShambley, CNM

## 2016-02-27 DIAGNOSIS — R21 Rash and other nonspecific skin eruption: Secondary | ICD-10-CM | POA: Diagnosis not present

## 2016-02-27 DIAGNOSIS — T50995A Adverse effect of other drugs, medicaments and biological substances, initial encounter: Secondary | ICD-10-CM | POA: Diagnosis not present

## 2016-02-28 ENCOUNTER — Ambulatory Visit: Payer: Self-pay | Admitting: Physician Assistant

## 2016-02-28 ENCOUNTER — Encounter: Payer: Self-pay | Admitting: Physician Assistant

## 2016-02-28 VITALS — BP 120/80 | HR 78 | Temp 98.7°F

## 2016-02-28 DIAGNOSIS — R21 Rash and other nonspecific skin eruption: Secondary | ICD-10-CM

## 2016-02-28 DIAGNOSIS — R509 Fever, unspecified: Secondary | ICD-10-CM

## 2016-02-28 LAB — POCT INFLUENZA A/B
INFLUENZA A, POC: NEGATIVE
INFLUENZA B, POC: NEGATIVE

## 2016-02-28 MED ORDER — LINDANE 1 % EX LOTN: 1.0000 "application " | TOPICAL_LOTION | Freq: Once | CUTANEOUS | 0 refills | Status: AC

## 2016-02-28 NOTE — Progress Notes (Signed)
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

## 2016-03-14 ENCOUNTER — Encounter: Payer: Self-pay | Admitting: Obstetrics and Gynecology

## 2016-03-14 ENCOUNTER — Ambulatory Visit (INDEPENDENT_AMBULATORY_CARE_PROVIDER_SITE_OTHER): Payer: 59 | Admitting: Obstetrics and Gynecology

## 2016-03-14 ENCOUNTER — Other Ambulatory Visit: Payer: Self-pay | Admitting: Obstetrics and Gynecology

## 2016-03-14 VITALS — BP 119/81 | HR 59 | Ht <= 58 in | Wt 148.5 lb

## 2016-03-14 DIAGNOSIS — F39 Unspecified mood [affective] disorder: Secondary | ICD-10-CM | POA: Diagnosis not present

## 2016-03-14 DIAGNOSIS — N926 Irregular menstruation, unspecified: Secondary | ICD-10-CM | POA: Diagnosis not present

## 2016-03-14 DIAGNOSIS — Z30432 Encounter for removal of intrauterine contraceptive device: Secondary | ICD-10-CM

## 2016-03-14 DIAGNOSIS — E669 Obesity, unspecified: Secondary | ICD-10-CM | POA: Diagnosis not present

## 2016-03-14 DIAGNOSIS — T8332XA Displacement of intrauterine contraceptive device, initial encounter: Secondary | ICD-10-CM

## 2016-03-14 DIAGNOSIS — L680 Hirsutism: Secondary | ICD-10-CM

## 2016-03-14 DIAGNOSIS — R4586 Emotional lability: Secondary | ICD-10-CM

## 2016-03-14 DIAGNOSIS — Z01411 Encounter for gynecological examination (general) (routine) with abnormal findings: Secondary | ICD-10-CM | POA: Diagnosis not present

## 2016-03-14 DIAGNOSIS — Z01419 Encounter for gynecological examination (general) (routine) without abnormal findings: Secondary | ICD-10-CM | POA: Diagnosis not present

## 2016-03-14 DIAGNOSIS — T8389XA Other specified complication of genitourinary prosthetic devices, implants and grafts, initial encounter: Secondary | ICD-10-CM

## 2016-03-14 DIAGNOSIS — E66811 Obesity, class 1: Secondary | ICD-10-CM

## 2016-03-14 LAB — BETA HCG QUANT (REF LAB)

## 2016-03-14 MED ORDER — NORETHINDRONE 0.35 MG PO TABS
1.0000 | ORAL_TABLET | Freq: Every day | ORAL | 11 refills | Status: DC
Start: 2016-03-14 — End: 2017-01-30

## 2016-03-14 NOTE — Progress Notes (Signed)
Subjective:   Paula [name] is a 35 y.o. G12P2002 Caucasian female here for a routine well-woman exam.  No LMP recorded.   Thinks it was sometime in June/July. Current complaints: Vaginal Dryness, Mood Swings, Excessive Sweating, Abnormal hair grown on chin PCP: none      Will recheck labs  Social History: Sexual: heterosexual Marital Status: married Living situation: with spouse Occupation: Psychologist, sport and exercise Tobacco/alcohol: no tobacco use Illicit drugs: no history of illicit drug use  The following portions of the patient's history were reviewed and updated as appropriate: allergies, current medications, past family history, past medical history, past social history, past surgical history and problem list.  Past Medical History Past Medical History:  Diagnosis Date  . Anemia   . Asthma    exercise induced  . BULIMIA 01/17/2008   Annotation: AGE 13 Qualifier: Diagnosis of  By: Lorrene Reid LPN, Burna Mortimer    . Complication of anesthesia    vomited during last c/s  . 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 Cystic fibrosis carrier in second trimester, antepartum 2016  . Depression   . Eczema   . GERD (gastroesophageal reflux disease)    chronic  . Headache    h/o migraines  . Hypothyroidism   . Nausea   . Oligomenorrhea 12/22/2014  . PONV (postoperative nausea and vomiting)   . Rhinitis, allergic     Past Surgical History Past Surgical History:  Procedure Laterality Date  . CESAREAN SECTION  04/19/2013   LTCS; CPD  . CESAREAN SECTION N/A 10/27/2015   Procedure: REPEAT CESAREAN SECTION;  Surgeon: Herold Harms, MD;  Location: [facility] ORS;  Service: Obstetrics;  Laterality: N/A;  . TONSILLECTOMY      Gynecologic History [id number]  No LMP recorded. Contraception: IUD Last Pap: 2016. Results were: normal   Obstetric History OB History  Gravida Para Term Preterm AB Living  2 2 2     2   SAB TAB Ectopic Multiple Live Births        0 2    # Outcome Date GA Lbr Len/2nd  Weight Sex Delivery Anes PTL Lv  2 Term 10/27/15 [redacted]w[redacted]d  7 lb 11.1 oz (3.49 kg) F CS-LTranv   LIV  1 Term 04/19/13 [redacted]w[redacted]d  7 lb 4 oz (3.289 kg) F CS-LTranv   LIV    Obstetric Comments  LTCS: [place], CPD.     Current Medications Current Outpatient Prescriptions on File Prior to Visit  Medication Sig Dispense Refill  . cetirizine (ZYRTEC) 10 MG tablet Take 10 mg by mouth daily.    . Cholecalciferol (VITAMIN D3) 5000 units CAPS Take 1 capsule (5,000 Units total) by mouth daily. 120 capsule 2  . levothyroxine (SYNTHROID, LEVOTHROID) 75 MCG tablet TAKE 1 TABLET BY MOUTH DAILY BEFORE BREAKFAST. 30 tablet 6  . PARAGARD INTRAUTERINE COPPER IU by Intrauterine route.    Marland Kitchen ibuprofen (ADVIL,MOTRIN) 800 MG tablet Take 1 tablet (800 mg total) by mouth every 8 (eight) hours. 60 tablet 1  . metoCLOPramide (REGLAN) 10 MG tablet Take 1 tablet (10 mg total) by mouth 2 (two) times daily. (Patient not taking: Reported on 03/14/2016) 30 tablet 2  . metroNIDAZOLE (FLAGYL) 500 MG tablet Take 1 tablet (500 mg total) by mouth 2 (two) times daily. (Patient not taking: Reported on 03/14/2016) 14 tablet 0  . Prenatal Vit-Fe Fumarate-FA (PRENATAL MULTIVITAMIN) TABS tablet Take 1 tablet by mouth daily at 12 noon.     No current facility-administered medications on file prior to visit.  Review of Systems Patient denies any headaches, blurred vision, shortness of breath, chest pain, abdominal pain, problems with bowel movements, urination, or intercourse.  Objective:  BP 119/81   Pulse (!) 59   Ht 4\' 10"  (1.473 m)   Wt 148 lb 8 oz (67.4 kg)   Breastfeeding? Yes   BMI 31.04 kg/m  Physical Exam  General:  Well developed, well nourished, no acute distress. She is alert and oriented x3. Skin:  Warm and dry, 2 areas of hair grown noted on chin Neck:  Midline trachea, no thyromegaly or nodules Cardiovascular: Regular rate and rhythm, no murmur heard Lungs:  Effort normal, all lung fields clear to auscultation  bilaterally Breasts:  No dominant palpable mass, retraction, or nipple discharge Abdomen:  Soft, non tender, no hepatosplenomegaly or masses Pelvic:  External genitalia is normal in appearance.  The vagina is normal in appearance. The cervix is bulbous, no CMT.  Thin prep pap is done with HR HPV cotesting. Uterus is felt to be normal size, shape, and contour. IUD visualized in the cervix. No adnexal masses or tenderness noted. IUD removed without difficulty at patient's permission. Microscopic wet-mount exam shows negative for pathogens, normal epithelial cells. Rectal: Good sphincter tone, no polyps, or hemorrhoids felt.  Hemoccult: not done Extremities:  No swelling or varicosities noted Psych:  She has a normal mood and affect  Assessment:   Healthy well-woman exam IUD Displacement Mood Swings Hirsutism Hx of PCOS Obesity Irregular menses secondary to lactation.  Plan:  IUD removed at this time. Beta HCG drawn. Discussed alternative birth control methods and decided on camila, RX sent in. Advised to use backup method for first two weeks. Discussed possibility of adding SSRI at next visit for mood swings.  Camila will also help with Hirsutism. F/U in 1 year for AE, or sooner if needed  Shaana Acocella Suzan NailerN Dempsey Knotek, CNM

## 2016-03-14 NOTE — Patient Instructions (Signed)
Preventive Care for Adults, Female A healthy lifestyle and preventive care can promote health and wellness. Preventive health guidelines for women include the following key practices.  A routine yearly physical is a good way to check with your health care provider about your health and preventive screening. It is a chance to share any concerns and updates on your health and to receive a thorough exam.  Visit your dentist for a routine exam and preventive care every 6 months. Brush your teeth twice a day and floss once a day. Good oral hygiene prevents tooth decay and gum disease.  The frequency of eye exams is based on your age, health, family medical history, use of contact lenses, and other factors. Follow your health care provider's recommendations for frequency of eye exams.  Eat a healthy diet. Foods like vegetables, fruits, whole grains, low-fat dairy products, and lean protein foods contain the nutrients you need without too many calories. Decrease your intake of foods high in solid fats, added sugars, and salt. Eat the right amount of calories for you.Get information about a proper diet from your health care provider, if necessary.  Regular physical exercise is one of the most important things you can do for your health. Most adults should get at least 150 minutes of moderate-intensity exercise (any activity that increases your heart rate and causes you to sweat) each week. In addition, most adults need muscle-strengthening exercises on 2 or more days a week.  Maintain a healthy weight. The body mass index (BMI) is a screening tool to identify possible weight problems. It provides an estimate of body fat based on height and weight. Your health care provider can find your BMI and can help you achieve or maintain a healthy weight.For adults 20 years and older:  A BMI below 18.5 is considered underweight.  A BMI of 18.5 to 24.9 is normal.  A BMI of 25 to 29.9 is considered  overweight.  A BMI of 30 and above is considered obese.  Maintain normal blood lipids and cholesterol levels by exercising and minimizing your intake of saturated fat. Eat a balanced diet with plenty of fruit and vegetables. Blood tests for lipids and cholesterol should begin at age 64 and be repeated every 5 years. If your lipid or cholesterol levels are high, you are over 50, or you are at high risk for heart disease, you may need your cholesterol levels checked more frequently.Ongoing high lipid and cholesterol levels should be treated with medicines if diet and exercise are not working.  If you smoke, find out from your health care provider how to quit. If you do not use tobacco, do not start.  Lung cancer screening is recommended for adults aged 52-80 years who are at high risk for developing lung cancer because of a history of smoking. A yearly low-dose CT scan of the lungs is recommended for people who have at least a 30-pack-year history of smoking and are a current smoker or have quit within the past 15 years. A pack year of smoking is smoking an average of 1 pack of cigarettes a day for 1 year (for example: 1 pack a day for 30 years or 2 packs a day for 15 years). Yearly screening should continue until the smoker has stopped smoking for at least 15 years. Yearly screening should be stopped for people who develop a health problem that would prevent them from having lung cancer treatment.  If you are pregnant, do not drink alcohol. If you are  breastfeeding, be very cautious about drinking alcohol. If you are not pregnant and choose to drink alcohol, do not have more than 1 drink per day. One drink is considered to be 12 ounces (355 mL) of beer, 5 ounces (148 mL) of wine, or 1.5 ounces (44 mL) of liquor.  Avoid use of street drugs. Do not share needles with anyone. Ask for help if you need support or instructions about stopping the use of drugs.  High blood pressure causes heart disease and  increases the risk of stroke. Your blood pressure should be checked at least every 1 to 2 years. Ongoing high blood pressure should be treated with medicines if weight loss and exercise do not work.  If you are 61-51 years old, ask your health care provider if you should take aspirin to prevent strokes.  Diabetes screening is done by taking a blood sample to check your blood glucose level after you have not eaten for a certain period of time (fasting). If you are not overweight and you do not have risk factors for diabetes, you should be screened once every 3 years starting at age 34. If you are overweight or obese and you are 34-63 years of age, you should be screened for diabetes every year as part of your cardiovascular risk assessment.  Breast cancer screening is essential preventive care for women. You should practice "breast self-awareness." This means understanding the normal appearance and feel of your breasts and may include breast self-examination. Any changes detected, no matter how small, should be reported to a health care provider. Women in their 42s and 30s should have a clinical breast exam (CBE) by a health care provider as part of a regular health exam every 1 to 3 years. After age 54, women should have a CBE every year. Starting at age 73, women should consider having a mammogram (breast X-ray test) every year. Women who have a family history of breast cancer should talk to their health care provider about genetic screening. Women at a high risk of breast cancer should talk to their health care providers about having an MRI and a mammogram every year.  Breast cancer gene (BRCA)-related cancer risk assessment is recommended for women who have family members with BRCA-related cancers. BRCA-related cancers include breast, ovarian, tubal, and peritoneal cancers. Having family members with these cancers may be associated with an increased risk for harmful changes (mutations) in the breast  cancer genes BRCA1 and BRCA2. Results of the assessment will determine the need for genetic counseling and BRCA1 and BRCA2 testing.  Your health care provider may recommend that you be screened regularly for cancer of the pelvic organs (ovaries, uterus, and vagina). This screening involves a pelvic examination, including checking for microscopic changes to the surface of your cervix (Pap test). You may be encouraged to have this screening done every 3 years, beginning at age 26.  For women ages 29-65, health care providers may recommend pelvic exams and Pap testing every 3 years, or they may recommend the Pap and pelvic exam, combined with testing for human papilloma virus (HPV), every 5 years. Some types of HPV increase your risk of cervical cancer. Testing for HPV may also be done on women of any age with unclear Pap test results.  Other health care providers may not recommend any screening for nonpregnant women who are considered low risk for pelvic cancer and who do not have symptoms. Ask your health care provider if a screening pelvic exam is right for  you.  If you have had past treatment for cervical cancer or a condition that could lead to cancer, you need Pap tests and screening for cancer for at least 20 years after your treatment. If Pap tests have been discontinued, your risk factors (such as having a new sexual partner) need to be reassessed to determine if screening should resume. Some women have medical problems that increase the chance of getting cervical cancer. In these cases, your health care provider may recommend more frequent screening and Pap tests.  Colorectal cancer can be detected and often prevented. Most routine colorectal cancer screening begins at the age of 50 years and continues through age 75 years. However, your health care provider may recommend screening at an earlier age if you have risk factors for colon cancer. On a yearly basis, your health care provider may provide  home test kits to check for hidden blood in the stool. Use of a small camera at the end of a tube, to directly examine the colon (sigmoidoscopy or colonoscopy), can detect the earliest forms of colorectal cancer. Talk to your health care provider about this at age 50, when routine screening begins. Direct exam of the colon should be repeated every 5-10 years through age 75 years, unless early forms of precancerous polyps or small growths are found.  People who are at an increased risk for hepatitis B should be screened for this virus. You are considered at high risk for hepatitis B if:  You were born in a country where hepatitis B occurs often. Talk with your health care provider about which countries are considered high risk.  Your parents were born in a high-risk country and you have not received a shot to protect against hepatitis B (hepatitis B vaccine).  You have HIV or AIDS.  You use needles to inject street drugs.  You live with, or have sex with, someone who has hepatitis B.  You get hemodialysis treatment.  You take certain medicines for conditions like cancer, organ transplantation, and autoimmune conditions.  Hepatitis C blood testing is recommended for all people born from 1945 through 1965 and any individual with known risks for hepatitis C.  Practice safe sex. Use condoms and avoid high-risk sexual practices to reduce the spread of sexually transmitted infections (STIs). STIs include gonorrhea, chlamydia, syphilis, trichomonas, herpes, HPV, and human immunodeficiency virus (HIV). Herpes, HIV, and HPV are viral illnesses that have no cure. They can result in disability, cancer, and death.  You should be screened for sexually transmitted illnesses (STIs) including gonorrhea and chlamydia if:  You are sexually active and are younger than 24 years.  You are older than 24 years and your health care provider tells you that you are at risk for this type of infection.  Your sexual  activity has changed since you were last screened and you are at an increased risk for chlamydia or gonorrhea. Ask your health care provider if you are at risk.  If you are at risk of being infected with HIV, it is recommended that you take a prescription medicine daily to prevent HIV infection. This is called preexposure prophylaxis (PrEP). You are considered at risk if:  You are sexually active and do not regularly use condoms or know the HIV status of your partner(s).  You take drugs by injection.  You are sexually active with a partner who has HIV.  Talk with your health care provider about whether you are at high risk of being infected with HIV. If   you choose to begin PrEP, you should first be tested for HIV. You should then be tested every 3 months for as long as you are taking PrEP.  Osteoporosis is a disease in which the bones lose minerals and strength with aging. This can result in serious bone fractures or breaks. The risk of osteoporosis can be identified using a bone density scan. Women ages 1 years and over and women at risk for fractures or osteoporosis should discuss screening with their health care providers. Ask your health care provider whether you should take a calcium supplement or vitamin D to reduce the rate of osteoporosis.  Menopause can be associated with physical symptoms and risks. Hormone replacement therapy is available to decrease symptoms and risks. You should talk to your health care provider about whether hormone replacement therapy is right for you.  Use sunscreen. Apply sunscreen liberally and repeatedly throughout the day. You should seek shade when your shadow is shorter than you. Protect yourself by wearing long sleeves, pants, a wide-brimmed hat, and sunglasses year round, whenever you are outdoors.  Once a month, do a whole body skin exam, using a mirror to look at the skin on your back. Tell your health care provider of new moles, moles that have irregular  borders, moles that are larger than a pencil eraser, or moles that have changed in shape or color.  Stay current with required vaccines (immunizations).  Influenza vaccine. All adults should be immunized every year.  Tetanus, diphtheria, and acellular pertussis (Td, Tdap) vaccine. Pregnant women should receive 1 dose of Tdap vaccine during each pregnancy. The dose should be obtained regardless of the length of time since the last dose. Immunization is preferred during the 27th-36th week of gestation. An adult who has not previously received Tdap or who does not know her vaccine status should receive 1 dose of Tdap. This initial dose should be followed by tetanus and diphtheria toxoids (Td) booster doses every 10 years. Adults with an unknown or incomplete history of completing a 3-dose immunization series with Td-containing vaccines should begin or complete a primary immunization series including a Tdap dose. Adults should receive a Td booster every 10 years.  Varicella vaccine. An adult without evidence of immunity to varicella should receive 2 doses or a second dose if she has previously received 1 dose. Pregnant females who do not have evidence of immunity should receive the first dose after pregnancy. This first dose should be obtained before leaving the health care facility. The second dose should be obtained 4-8 weeks after the first dose.  Human papillomavirus (HPV) vaccine. Females aged 13-26 years who have not received the vaccine previously should obtain the 3-dose series. The vaccine is not recommended for use in pregnant females. However, pregnancy testing is not needed before receiving a dose. If a female is found to be pregnant after receiving a dose, no treatment is needed. In that case, the remaining doses should be delayed until after the pregnancy. Immunization is recommended for any person with an immunocompromised condition through the age of 24 years if she did not get any or all doses  earlier. During the 3-dose series, the second dose should be obtained 4-8 weeks after the first dose. The third dose should be obtained 24 weeks after the first dose and 16 weeks after the second dose.  Zoster vaccine. One dose is recommended for adults aged 97 years or older unless certain conditions are present.  Measles, mumps, and rubella (MMR) vaccine. Adults born  before 1957 generally are considered immune to measles and mumps. Adults born in [birth year] or later should have 1 or more doses of MMR vaccine unless there is a contraindication to the vaccine or there is laboratory evidence of immunity to each of the three diseases. A routine second dose of MMR vaccine should be obtained at least 28 days after the first dose for students attending postsecondary schools, health care workers, or international travelers. People who received inactivated measles vaccine or an unknown type of measles vaccine during 1963-1967 should receive 2 doses of MMR vaccine. People who received inactivated mumps vaccine or an unknown type of mumps vaccine before 1979 and are at high risk for mumps infection should consider immunization with 2 doses of MMR vaccine. For females of childbearing age, rubella immunity should be determined. If there is no evidence of immunity, females who are not pregnant should be vaccinated. If there is no evidence of immunity, females who are pregnant should delay immunization until after pregnancy. Unvaccinated health care workers born before [birth year] who lack laboratory evidence of measles, mumps, or rubella immunity or laboratory confirmation of disease should consider measles and mumps immunization with 2 doses of MMR vaccine or rubella immunization with 1 dose of MMR vaccine.  Pneumococcal 13-valent conjugate (PCV13) vaccine. When indicated, a person who is uncertain of his immunization history and has no record of immunization should receive the PCV13 vaccine. All adults 40 years of age and older  should receive this vaccine. An adult aged 90 years or older who has certain medical conditions and has not been previously immunized should receive 1 dose of PCV13 vaccine. This PCV13 should be followed with a dose of pneumococcal polysaccharide (PPSV23) vaccine. Adults who are at high risk for pneumococcal disease should obtain the PPSV23 vaccine at least 8 weeks after the dose of PCV13 vaccine. Adults older than 33 years of age who have normal immune system function should obtain the PPSV23 vaccine dose at least 1 year after the dose of PCV13 vaccine.  Pneumococcal polysaccharide (PPSV23) vaccine. When PCV13 is also indicated, PCV13 should be obtained first. All adults aged 46 years and older should be immunized. An adult younger than age 54 years who has certain medical conditions should be immunized. Any person who resides in a nursing home or long-term care facility should be immunized. An adult smoker should be immunized. People with an immunocompromised condition and certain other conditions should receive both PCV13 and PPSV23 vaccines. People with human immunodeficiency virus (HIV) infection should be immunized as soon as possible after diagnosis. Immunization during chemotherapy or radiation therapy should be avoided. Routine use of PPSV23 vaccine is not recommended for American Indians, [place] Natives, or people younger than 65 years unless there are medical conditions that require PPSV23 vaccine. When indicated, people who have unknown immunization and have no record of immunization should receive PPSV23 vaccine. One-time revaccination 5 years after the first dose of PPSV23 is recommended for people aged 19-64 years who have chronic kidney failure, nephrotic syndrome, asplenia, or immunocompromised conditions. People who received 1-2 doses of PPSV23 before age 28 years should receive another dose of PPSV23 vaccine at age 19 years or later if at least 5 years have passed since the previous dose. Doses  of PPSV23 are not needed for people immunized with PPSV23 at or after age 71 years.  Meningococcal vaccine. Adults with asplenia or persistent complement component deficiencies should receive 2 doses of quadrivalent meningococcal conjugate (MenACWY-D) vaccine. The doses should be obtained  at least 2 months apart. Microbiologists working with certain meningococcal bacteria, [place] recruits, people at risk during an outbreak, and people who travel to or live in countries with a high rate of meningitis should be immunized. A first-year college student up through age 28 years who is living in a residence hall should receive a dose if she did not receive a dose on or after her 16th birthday. Adults who have certain high-risk conditions should receive one or more doses of vaccine.  Hepatitis A vaccine. Adults who wish to be protected from this disease, have certain high-risk conditions, work with hepatitis A-infected animals, work in hepatitis A research labs, or travel to or work in countries with a high rate of hepatitis A should be immunized. Adults who were previously unvaccinated and who anticipate close contact with an international adoptee during the first 60 days after arrival in the [place] from a country with a high rate of hepatitis A should be immunized.  Hepatitis B vaccine. Adults who wish to be protected from this disease, have certain high-risk conditions, may be exposed to blood or other infectious body fluids, are household contacts or sex partners of hepatitis B positive people, are clients or workers in certain care facilities, or travel to or work in countries with a high rate of hepatitis B should be immunized.  Haemophilus influenzae type b (Hib) vaccine. A previously unvaccinated person with asplenia or sickle cell disease or having a scheduled splenectomy should receive 1 dose of Hib vaccine. Regardless of previous immunization, a recipient of a hematopoietic stem cell transplant  should receive a 3-dose series 6-12 months after her successful transplant. Hib vaccine is not recommended for adults with HIV infection. Preventive Services / Frequency Ages 71 to 87 years  Blood pressure check.** / Every 3-5 years.  Lipid and cholesterol check.** / Every 5 years beginning at age 1.  Clinical breast exam.** / Every 3 years for women in their 3s and 31s.  BRCA-related cancer risk assessment.** / For women who have family members with a BRCA-related cancer (breast, ovarian, tubal, or peritoneal cancers).  Pap test.** / Every 2 years from ages 50 through 86. Every 3 years starting at age 87 through age 7 or 75 with a history of 3 consecutive normal Pap tests.  HPV screening.** / Every 3 years from ages 59 through ages 35 to 6 with a history of 3 consecutive normal Pap tests.  Hepatitis C blood test.** / For any individual with known risks for hepatitis C.  Skin self-exam. / Monthly.  Influenza vaccine. / Every year.  Tetanus, diphtheria, and acellular pertussis (Tdap, Td) vaccine.** / Consult your health care provider. Pregnant women should receive 1 dose of Tdap vaccine during each pregnancy. 1 dose of Td every 10 years.  Varicella vaccine.** / Consult your health care provider. Pregnant females who do not have evidence of immunity should receive the first dose after pregnancy.  HPV vaccine. / 3 doses over 6 months, if 72 and younger. The vaccine is not recommended for use in pregnant females. However, pregnancy testing is not needed before receiving a dose.  Measles, mumps, rubella (MMR) vaccine.** / You need at least 1 dose of MMR if you were born in 1957 or later. You may also need a 2nd dose. For females of childbearing age, rubella immunity should be determined. If there is no evidence of immunity, females who are not pregnant should be vaccinated. If there is no evidence of immunity, females who are  pregnant should delay immunization until after  pregnancy.  Pneumococcal 13-valent conjugate (PCV13) vaccine.** / Consult your health care provider.  Pneumococcal polysaccharide (PPSV23) vaccine.** / 1 to 2 doses if you smoke cigarettes or if you have certain conditions.  Meningococcal vaccine.** / 1 dose if you are age 66 to 89 years and a Market researcher living in a residence hall, or have one of several medical conditions, you need to get vaccinated against meningococcal disease. You may also need additional booster doses.  Hepatitis A vaccine.** / Consult your health care provider.  Hepatitis B vaccine.** / Consult your health care provider.  Haemophilus influenzae type b (Hib) vaccine.** / Consult your health care provider. Ages 75 to 19 years  Blood pressure check.** / Every year.  Lipid and cholesterol check.** / Every 5 years beginning at age 61 years.  Lung cancer screening. / Every year if you are aged 76-80 years and have a 30-pack-year history of smoking and currently smoke or have quit within the past 15 years. Yearly screening is stopped once you have quit smoking for at least 15 years or develop a health problem that would prevent you from having lung cancer treatment.  Clinical breast exam.** / Every year after age 62 years.  BRCA-related cancer risk assessment.** / For women who have family members with a BRCA-related cancer (breast, ovarian, tubal, or peritoneal cancers).  Mammogram.** / Every year beginning at age 80 years and continuing for as long as you are in good health. Consult with your health care provider.  Pap test.** / Every 3 years starting at age 32 years through age 39 or 85 years with a history of 3 consecutive normal Pap tests.  HPV screening.** / Every 3 years from ages 33 years through ages 72 to 40 years with a history of 3 consecutive normal Pap tests.  Fecal occult blood test (FOBT) of stool. / Every year beginning at age 64 years and continuing until age 68 years. You may not need  to do this test if you get a colonoscopy every 10 years.  Flexible sigmoidoscopy or colonoscopy.** / Every 5 years for a flexible sigmoidoscopy or every 10 years for a colonoscopy beginning at age 40 years and continuing until age 76 years.  Hepatitis C blood test.** / For all people born from [birth year] through 1965 and any individual with known risks for hepatitis C.  Skin self-exam. / Monthly.  Influenza vaccine. / Every year.  Tetanus, diphtheria, and acellular pertussis (Tdap/Td) vaccine.** / Consult your health care provider. Pregnant women should receive 1 dose of Tdap vaccine during each pregnancy. 1 dose of Td every 10 years.  Varicella vaccine.** / Consult your health care provider. Pregnant females who do not have evidence of immunity should receive the first dose after pregnancy.  Zoster vaccine.** / 1 dose for adults aged 80 years or older.  Measles, mumps, rubella (MMR) vaccine.** / You need at least 1 dose of MMR if you were born in 1957 or later. You may also need a second dose. For females of childbearing age, rubella immunity should be determined. If there is no evidence of immunity, females who are not pregnant should be vaccinated. If there is no evidence of immunity, females who are pregnant should delay immunization until after pregnancy.  Pneumococcal 13-valent conjugate (PCV13) vaccine.** / Consult your health care provider.  Pneumococcal polysaccharide (PPSV23) vaccine.** / 1 to 2 doses if you smoke cigarettes or if you have certain conditions.  Meningococcal vaccine.** /  Consult your health care provider.  Hepatitis A vaccine.** / Consult your health care provider.  Hepatitis B vaccine.** / Consult your health care provider.  Haemophilus influenzae type b (Hib) vaccine.** / Consult your health care provider. Ages 67 years and over  Blood pressure check.** / Every year.  Lipid and cholesterol check.** / Every 5 years beginning at age 16 years.  Lung cancer  screening. / Every year if you are aged 40-80 years and have a 30-pack-year history of smoking and currently smoke or have quit within the past 15 years. Yearly screening is stopped once you have quit smoking for at least 15 years or develop a health problem that would prevent you from having lung cancer treatment.  Clinical breast exam.** / Every year after age 77 years.  BRCA-related cancer risk assessment.** / For women who have family members with a BRCA-related cancer (breast, ovarian, tubal, or peritoneal cancers).  Mammogram.** / Every year beginning at age 49 years and continuing for as long as you are in good health. Consult with your health care provider.  Pap test.** / Every 3 years starting at age 63 years through age 78 or 67 years with 3 consecutive normal Pap tests. Testing can be stopped between 65 and 70 years with 3 consecutive normal Pap tests and no abnormal Pap or HPV tests in the past 10 years.  HPV screening.** / Every 3 years from ages 74 years through ages 60 or 69 years with a history of 3 consecutive normal Pap tests. Testing can be stopped between 65 and 70 years with 3 consecutive normal Pap tests and no abnormal Pap or HPV tests in the past 10 years.  Fecal occult blood test (FOBT) of stool. / Every year beginning at age 38 years and continuing until age 56 years. You may not need to do this test if you get a colonoscopy every 10 years.  Flexible sigmoidoscopy or colonoscopy.** / Every 5 years for a flexible sigmoidoscopy or every 10 years for a colonoscopy beginning at age 37 years and continuing until age 21 years.  Hepatitis C blood test.** / For all people born from [birth year] through 1963 and any individual with known risks for hepatitis C.  Osteoporosis screening.** / A one-time screening for women ages 19 years and over and women at risk for fractures or osteoporosis.  Skin self-exam. / Monthly.  Influenza vaccine. / Every year.  Tetanus, diphtheria, and  acellular pertussis (Tdap/Td) vaccine.** / 1 dose of Td every 10 years.  Varicella vaccine.** / Consult your health care provider.  Zoster vaccine.** / 1 dose for adults aged 40 years or older.  Pneumococcal 13-valent conjugate (PCV13) vaccine.** / Consult your health care provider.  Pneumococcal polysaccharide (PPSV23) vaccine.** / 1 dose for all adults aged 36 years and older.  Meningococcal vaccine.** / Consult your health care provider.  Hepatitis A vaccine.** / Consult your health care provider.  Hepatitis B vaccine.** / Consult your health care provider.  Haemophilus influenzae type b (Hib) vaccine.** / Consult your health care provider. ** Family history and personal history of risk and conditions may change your health care provider's recommendations.   This information is not intended to replace advice given to you by your health care provider. Make sure you discuss any questions you have with your health care provider.   Document Released: 07/11/2001 Document Revised: 06/05/2014 Document Reviewed: 10/10/2010 Elsevier Interactive Patient Education [employer].

## 2016-03-15 ENCOUNTER — Encounter: Payer: Self-pay | Admitting: Obstetrics and Gynecology

## 2016-03-15 LAB — COMPREHENSIVE METABOLIC PANEL
ALT: 28 IU/L (ref 0–32)
AST: 19 IU/L (ref 0–40)
Albumin/Globulin Ratio: 1.6 (ref 1.2–2.2)
Albumin: 4.3 g/dL (ref 3.5–5.5)
Alkaline Phosphatase: 89 IU/L (ref 39–117)
BUN/Creatinine Ratio: 22 (ref 9–23)
BUN: 16 mg/dL (ref 6–20)
Bilirubin Total: 1 mg/dL (ref 0.0–1.2)
CALCIUM: 9.2 mg/dL (ref 8.7–10.2)
CO2: 27 mmol/L (ref 18–29)
CREATININE: 0.72 mg/dL (ref 0.57–1.00)
Chloride: 98 mmol/L (ref 96–106)
GFR, EST AFRICAN AMERICAN: 127 mL/min/{1.73_m2} (ref >59)
GFR, EST NON AFRICAN AMERICAN: 110 mL/min/{1.73_m2} (ref >59)
GLOBULIN, TOTAL: 2.7 g/dL (ref 1.5–4.5)
Glucose: 80 mg/dL (ref 65–99)
Potassium: 4 mmol/L (ref 3.5–5.2)
Sodium: 139 mmol/L (ref 134–144)
TOTAL PROTEIN: 7 g/dL (ref 6.0–8.5)

## 2016-03-15 LAB — CBC
HEMATOCRIT: 38.8 % (ref 34.0–46.6)
Hemoglobin: 13.5 g/dL (ref 11.1–15.9)
MCH: 29.8 pg (ref 26.6–33.0)
MCHC: 34.8 g/dL (ref 31.5–35.7)
MCV: 86 fL (ref 79–97)
PLATELETS: 309 10*3/uL (ref 150–379)
RBC: 4.53 x10E6/uL (ref 3.77–5.28)
RDW: 13.8 % (ref 12.3–15.4)
WBC: 5.8 10*3/uL (ref 3.4–10.8)

## 2016-03-15 LAB — PROGESTERONE: PROGESTERONE: 0.1 ng/mL

## 2016-03-15 LAB — THYROID PANEL WITH TSH
FREE THYROXINE INDEX: 1.9 (ref 1.2–4.9)
T3 UPTAKE RATIO: 29 % (ref 24–39)
T4, Total: 6.4 ug/dL (ref 4.5–12.0)
TSH: 2.65 u[IU]/mL (ref 0.450–4.500)

## 2016-03-15 LAB — TESTOSTERONE, FREE, TOTAL, SHBG
Sex Hormone Binding: 34.5 nmol/L (ref 24.6–122.0)
TESTOSTERONE: 36 ng/dL (ref 8–48)
Testosterone, Free: 2.6 pg/mL (ref 0.0–4.2)

## 2016-03-15 LAB — ESTRADIOL: ESTRADIOL: 36.7 pg/mL

## 2016-03-15 LAB — CYTOLOGY - PAP

## 2016-03-15 LAB — VITAMIN D 25 HYDROXY (VIT D DEFICIENCY, FRACTURES): Vit D, 25-Hydroxy: 52.5 ng/mL (ref 30.0–100.0)

## 2016-03-15 LAB — PROLACTIN: Prolactin: 35.8 ng/mL — ABNORMAL HIGH (ref 4.8–23.3)

## 2016-03-15 LAB — DHEA-SULFATE: DHEA-SO4: 324.8 ug/dL (ref 84.8–378.0)

## 2016-03-15 LAB — IRON: Iron: 156 ug/dL (ref 27–159)

## 2016-03-23 ENCOUNTER — Encounter: Payer: Self-pay | Admitting: Obstetrics and Gynecology

## 2016-03-27 ENCOUNTER — Encounter: Payer: Self-pay | Admitting: Obstetrics and Gynecology

## 2016-03-28 ENCOUNTER — Other Ambulatory Visit: Payer: Self-pay | Admitting: *Deleted

## 2016-03-28 MED ORDER — SERTRALINE HCL 50 MG PO TABS: 50.0000 mg | ORAL_TABLET | Freq: Every day | ORAL | 6 refills | Status: DC

## 2016-03-30 ENCOUNTER — Other Ambulatory Visit: Payer: Self-pay | Admitting: *Deleted

## 2016-03-30 ENCOUNTER — Encounter: Payer: Self-pay | Admitting: Obstetrics and Gynecology

## 2016-03-30 MED ORDER — LEVOTHYROXINE SODIUM 75 MCG PO TABS: ORAL_TABLET | ORAL | 6 refills | Status: DC

## 2016-04-18 ENCOUNTER — Encounter: Payer: Self-pay | Admitting: Obstetrics and Gynecology

## 2016-05-04 ENCOUNTER — Other Ambulatory Visit: Payer: Self-pay | Admitting: *Deleted

## 2016-05-04 ENCOUNTER — Encounter: Payer: Self-pay | Admitting: Obstetrics and Gynecology

## 2016-05-04 MED ORDER — SERTRALINE HCL 100 MG PO TABS: 100.0000 mg | ORAL_TABLET | Freq: Every day | ORAL | 6 refills | Status: DC

## 2016-06-09 ENCOUNTER — Encounter: Payer: Self-pay | Admitting: Obstetrics and Gynecology

## 2016-06-12 ENCOUNTER — Telehealth: Payer: 59 | Admitting: Family

## 2016-06-12 ENCOUNTER — Other Ambulatory Visit: Payer: Self-pay | Admitting: *Deleted

## 2016-06-12 DIAGNOSIS — J329 Chronic sinusitis, unspecified: Secondary | ICD-10-CM | POA: Diagnosis not present

## 2016-06-12 MED ORDER — AMOXICILLIN-POT CLAVULANATE 875-125 MG PO TABS: 1.0000 | ORAL_TABLET | Freq: Two times a day (BID) | ORAL | 0 refills | Status: DC

## 2016-06-12 MED ORDER — SERTRALINE HCL 100 MG PO TABS: 100.0000 mg | ORAL_TABLET | Freq: Every day | ORAL | 6 refills | Status: DC

## 2016-06-12 NOTE — Progress Notes (Signed)

## 2016-06-29 ENCOUNTER — Telehealth: Payer: 59 | Admitting: Family

## 2016-06-29 ENCOUNTER — Other Ambulatory Visit: Payer: Self-pay | Admitting: Obstetrics and Gynecology

## 2016-06-29 ENCOUNTER — Telehealth: Payer: Self-pay | Admitting: Obstetrics and Gynecology

## 2016-06-29 DIAGNOSIS — N61 Mastitis without abscess: Secondary | ICD-10-CM

## 2016-06-29 MED ORDER — CEPHALEXIN 500 MG PO CAPS: 500.0000 mg | ORAL_CAPSULE | Freq: Four times a day (QID) | ORAL | 0 refills | Status: DC

## 2016-06-29 MED ORDER — DICLOXACILLIN SODIUM 500 MG PO CAPS: 500.0000 mg | ORAL_CAPSULE | Freq: Four times a day (QID) | ORAL | 2 refills | Status: DC

## 2016-06-29 NOTE — Progress Notes (Signed)
E Visit for Rash  We are sorry that you are not feeling well. Here is how we plan to help!   Based upon what you have shared with me it looks like you have a mastitis.  Mastitis is an infection of the skin and is treated with an antibiotic. I have prescribed:  Keflex 500 mg three times per day for 7 days  HOME CARE:   Take cool showers and avoid direct sunlight.  Apply cool compress or wet dressings.  Take a bath in an oatmeal bath.  Sprinkle content of one Aveeno packet under running faucet with comfortably warm water.  Bathe for 15-20 minutes, 1-2 times daily.  Pat dry with a towel. Do not rub the rash.  Use hydrocortisone cream.  Take an antihistamine like Benadryl for widespread rashes that itch.  The adult dose of Benadryl is 25-50 mg by mouth 4 times daily.  Caution:  This type of medication may cause sleepiness.  Do not drink alcohol, drive, or operate dangerous machinery while taking antihistamines.  Do not take these medications if you have prostate enlargement.  Read package instructions thoroughly on all medications that you take.  GET HELP RIGHT AWAY IF:   Symptoms don't go away after treatment.  Severe itching that persists.  If you rash spreads or swells.  If you rash begins to smell.  If it blisters and opens or develops a yellow-brown crust.  You develop a fever.  You have a sore throat.  You become short of breath.  MAKE SURE YOU:  Understand these instructions. Will watch your condition. Will get help right away if you are not doing well or get worse.  Thank you for choosing an e-visit. Your e-visit answers were reviewed by a board certified advanced clinical practitioner to complete your personal care plan. Depending upon the condition, your plan could have included both over the counter or prescription medications. Please review your pharmacy choice. Be sure that the pharmacy you have chosen is open so that you can pick up your prescription now.   If there is a problem you may message your provider in MyChart to have the prescription routed to another pharmacy. Your safety is important to us. If you have drug allergies check your prescription carefully.  For the next 24 hours, you can use MyChart to ask questions about today's visit, request a non-urgent call back, or ask for a work or school excuse from your e-visit provider. You will get an email in the next two days asking about your experience. I hope that your e-visit has been valuable and will speed your recovery.

## 2016-06-29 NOTE — Telephone Encounter (Signed)
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

## 2016-06-29 NOTE — Telephone Encounter (Signed)
I sent in a prescription, she is to start immediately, one pill 4 times a day for a week

## 2016-06-29 NOTE — Telephone Encounter (Signed)
pls advise

## 2016-06-29 NOTE — Telephone Encounter (Signed)
Left detailed message for pt 

## 2016-07-12 DIAGNOSIS — H5202 Hypermetropia, left eye: Secondary | ICD-10-CM | POA: Diagnosis not present

## 2016-07-12 DIAGNOSIS — H52222 Regular astigmatism, left eye: Secondary | ICD-10-CM | POA: Diagnosis not present

## 2016-07-26 ENCOUNTER — Ambulatory Visit: Payer: Self-pay | Admitting: Physician Assistant

## 2016-07-26 ENCOUNTER — Encounter: Payer: Self-pay | Admitting: Physician Assistant

## 2016-07-26 VITALS — BP 110/80 | HR 73 | Temp 98.2°F

## 2016-07-26 DIAGNOSIS — J0120 Acute ethmoidal sinusitis, unspecified: Secondary | ICD-10-CM

## 2016-07-26 MED ORDER — AMOXICILLIN 875 MG PO TABS
875.0000 mg | ORAL_TABLET | Freq: Two times a day (BID) | ORAL | 0 refills | Status: DC
Start: 2016-07-26 — End: 2016-07-26

## 2016-07-26 MED ORDER — AMOXICILLIN 875 MG PO TABS: 875.0000 mg | ORAL_TABLET | Freq: Two times a day (BID) | ORAL | 0 refills | Status: DC

## 2016-07-26 NOTE — Addendum Note (Signed)
Addended by: Joni ReiningSMITH, Chancelor Hardrick K on: 07/26/2016 04:16 PM   Modules accepted: Orders

## 2016-07-26 NOTE — Progress Notes (Signed)
   Subjective:sinus congestion    Patient ID: Paula [name], female    DOB: 03/10/1981, 36 y.o.   MRN: [id number]  HPI Patient c/o sinus congestion for one week. Also c/o cough 2nd to post nasal drainage. Denies fever/chill or N/V/D.   Review of Systems    Currenly breast feeding. Objective:   Physical Exam HEENT remarkable for edematous nasal turbinates, thick greenish nasal discharge, and post nasal drainage tract. Neck supple w/o adenopathy. Lungs CTA. Heart RRR.       Assessment & Plan:Sinusitis  Amxoil and OTC Benadryl as directed.

## 2016-08-04 ENCOUNTER — Ambulatory Visit: Admit: 2016-08-04 | Discharge: 2016-08-04 | Payer: PRIVATE HEALTH INSURANCE | Attending: Specialist

## 2016-08-04 DIAGNOSIS — Z01419 Encounter for gynecological examination (general) (routine) without abnormal findings: Secondary | ICD-10-CM

## 2016-08-04 MED ORDER — FLUCONAZOLE 100 MG PO TABS
100 MG | ORAL_TABLET | Freq: Every day | ORAL | 0 refills | Status: AC
Start: 2016-08-04 — End: 2016-08-11

## 2016-08-04 MED ORDER — METRONIDAZOLE 500 MG PO TABS
500 MG | ORAL_TABLET | Freq: Two times a day (BID) | ORAL | 0 refills | Status: AC
Start: 2016-08-04 — End: 2016-08-11

## 2016-08-04 NOTE — Progress Notes (Signed)
Subjective:      Patient ID: Olivia Castro is a 36 y.o. female.  Chief Complaint   Patient presents with   ??? Annual Exam     Patient is here today for her annual pap smear and breast exam. Patient denies having any discharge. Patient states she wants STD testing but denies having any syptoms of STDs. Patient would also like to have HIV testing.   ??? Sexually Transmitted Diseases     BP 122/81 (Site: Right Arm, Position: Sitting, Cuff Size: Large Adult)    Pulse 54    Ht 5\' 4"  (1.626 m)    Wt 166 lb (75.3 kg)    LMP 07/26/2016    BMI 28.49 kg/m??   Patient's last menstrual period was 07/26/2016.    [id number]    History reviewed. No pertinent past medical history.  Current Outpatient Prescriptions Ordered in Epic   Medication Sig Dispense Refill   ??? metroNIDAZOLE (FLAGYL) 500 MG tablet Take 1 tablet by mouth 2 times daily for 7 days 14 tablet 0   ??? fluconazole (DIFLUCAN) 100 MG tablet Take 1 tablet by mouth daily for 7 days 7 tablet 0     No current Epic-ordered facility-administered medications on file.      Problem List Items Addressed This Visit     None      Visit Diagnoses     Well woman exam    -  Primary    Acute vaginitis        Relevant Orders    C.trachomatis N.gonorrhoeae DNA    PAP SMEAR    HIV Screen    Encounter for screening for HIV            No Known Allergies  Orders Placed This Encounter   Procedures   ??? C.trachomatis N.gonorrhoeae DNA     Standing Status:   Future     Standing Expiration Date:   10/04/2016   ??? PAP SMEAR     Patient History:    Patient's last menstrual period was 07/26/2016.  OBGYN Status: Having periods  Past Surgical History:  No date: ECTOPIC PREGNANCY SURGERY      Smoking status: Never Smoker                                                              Smokeless tobacco: Never Used                           Standing Status:   Future     Standing Expiration Date:   10/04/2016     Order Specific Question:   Collection Type     Answer:   Thin Prep     Order Specific Question:    Prior Abnormal Pap Test     Answer:   No     Order Specific Question:   Screening or Diagnostic     Answer:   Screening     Order Specific Question:   HPV Requested?     Answer:   Yes -  If ASCUS Reflex HPV     Order Specific Question:   High Risk Patient     Answer:   N/A   ??? HIV Screen     Standing  Status:   Future     Standing Expiration Date:   10/04/2016        Patient is here for her annual exam.  She would like to be tested for sexually transmitted diseases, including HIV.  Patient has had both of her fallopian tubes removed due to ectopic pregnancies.  Patient denies sexual activity in 3 years.  She states that sometimes she gets a vaginal discharge, but it goes away on it's own.  She denies any gynecological complaints at this time.         Review of Systems   Constitutional: Negative for activity change, appetite change and fever.   HENT: Negative for ear discharge and ear pain.    Eyes: Negative for pain and visual disturbance.   Respiratory: Negative for apnea and cough.    Cardiovascular: Negative for chest pain, palpitations and leg swelling.   Gastrointestinal: Negative for abdominal distention, abdominal pain, constipation, diarrhea, nausea and vomiting.   Endocrine: Negative.    Genitourinary: Negative for difficulty urinating, dysuria, menstrual problem and pelvic pain.   Musculoskeletal: Negative for neck pain and neck stiffness.   Skin: Negative.    Neurological: Negative for light-headedness and numbness.   Hematological: Negative.  Does not bruise/bleed easily.       Objective:   Physical Exam   Constitutional: She is oriented to person, place, and time. Vital signs are normal. She appears well-developed and well-nourished.   HENT:   Head: Normocephalic and atraumatic.   Neck: No thyroid mass and no thyromegaly present.   Cardiovascular: Normal rate and regular rhythm.    Pulmonary/Chest: Effort normal and breath sounds normal. Right breast exhibits no inverted nipple, no mass, no nipple  discharge, no skin change and no tenderness. Left breast exhibits no inverted nipple, no mass, no nipple discharge, no skin change and no tenderness.   Abdominal: Soft. Bowel sounds are normal. She exhibits no distension and no mass. There is no tenderness. There is no rebound and no guarding. Hernia confirmed negative in the right inguinal area and confirmed negative in the left inguinal area.   Genitourinary: Rectum normal and uterus normal. Rectal exam shows no fissure, no mass and anal tone normal. No breast swelling, tenderness, discharge or bleeding. Pelvic exam was performed with patient supine. There is no rash or lesion on the right labia. There is no rash or lesion on the left labia. Uterus is not enlarged, not fixed and not tender. Cervix exhibits discharge. Cervix exhibits no motion tenderness and no friability. Right adnexum displays no mass and no tenderness. Left adnexum displays no mass and no tenderness. No tenderness in the vagina. No signs of injury around the vagina. Vaginal discharge found.   Musculoskeletal: Normal range of motion. She exhibits no edema or tenderness.   Lymphadenopathy:        Right: No inguinal adenopathy present.        Left: No inguinal adenopathy present.   Neurological: She is alert and oriented to person, place, and time.   Skin: Skin is warm and dry.   Psychiatric: She has a normal mood and affect. Judgment normal.   Nursing note and vitals reviewed.      Assessment:      Patient with vaginitis, otherwise normal annual exam. Pap and cultures were done.  Will treat with Flagyl and Diflucan. Patient requesting HIV screening.  Explained to patient that bacterial vaginitis is common and can be caused by changes in the vaginal pH.  Plan:      Orders Placed This Encounter   Procedures   ??? C.trachomatis N.gonorrhoeae DNA   ??? PAP SMEAR   ??? HIV Screen     Orders Placed This Encounter   Medications   ??? metroNIDAZOLE (FLAGYL) 500 MG tablet     Sig: Take 1 tablet by mouth 2  times daily for 7 days     Dispense:  14 tablet     Refill:  0   ??? fluconazole (DIFLUCAN) 100 MG tablet     Sig: Take 1 tablet by mouth daily for 7 days     Dispense:  7 tablet     Refill:  0      Appointment in 1 year.    Solon PalmI, Doris Wisniewski, am scribing for, and in the presence of Dr. Erline Levinelaudio E. Anglea Gordner.   Electronically signed by: Meyer Coryoris Wisniewski 08/04/16 10:12 AM       I agree to the above documentation placed by my scribe Meyer Coryoris Wisniewski. I reviewed the scribe's note and agree with the documented findings and plan of care. Any areas of disagreement are noted on the chart.   I have personally evaluated this patient. Additional findings are as noted. I agree with the chief complaint, past medical history, past surgical history, allergies, medications, social and family history as documented unless otherwise noted below.     Electronically signed by Erline Levinelaudio E Lyrik Buresh, MD on 08/06/2016 at 5:58 AM

## 2016-08-04 NOTE — Progress Notes (Signed)
Patient was in the office today for a HIV.    Draw per physician order using sterile technique.  Drawn from the right antecube.

## 2016-08-22 ENCOUNTER — Encounter

## 2016-11-09 ENCOUNTER — Encounter: Payer: Self-pay | Admitting: Physician Assistant

## 2016-11-09 ENCOUNTER — Ambulatory Visit: Payer: Self-pay | Admitting: Physician Assistant

## 2016-11-09 VITALS — BP 130/90 | HR 60 | Temp 98.5°F

## 2016-11-09 DIAGNOSIS — R300 Dysuria: Secondary | ICD-10-CM

## 2016-11-09 LAB — POCT URINALYSIS DIPSTICK
Bilirubin, UA: NEGATIVE
Blood, UA: NEGATIVE
Glucose, UA: NEGATIVE
Ketones, UA: NEGATIVE
LEUKOCYTES UA: NEGATIVE
Nitrite, UA: NEGATIVE
PROTEIN UA: NEGATIVE
Spec Grav, UA: 1.025 (ref 1.010–1.025)
UROBILINOGEN UA: 0.2 U/dL
pH, UA: 5 (ref 5.0–8.0)

## 2016-11-09 MED ORDER — SULFAMETHOXAZOLE-TRIMETHOPRIM 800-160 MG PO TABS: 1.0000 | ORAL_TABLET | Freq: Two times a day (BID) | ORAL | 0 refills | Status: DC

## 2016-11-09 MED ORDER — PHENAZOPYRIDINE HCL 200 MG PO TABS: 200.0000 mg | ORAL_TABLET | Freq: Three times a day (TID) | ORAL | 0 refills | Status: DC | PRN

## 2016-11-09 NOTE — Progress Notes (Signed)
   Subjective:Dysuria    Patient ID: Paula FlavorsCrystal D Kari, female    DOB: 1980/08/19, 36 y.o.   MRN: [id number]  HPI Patient c/o dysuria with this morning awakening. Denies fever, vaginal discharge, or flank pain. No palliative measure for compliant.   Review of Systems Hypothyroidism    Objective:   Physical Exam Deferred. Dip UA unremarkable.       Assessment & Plan:Dysuria  Advised Pyridium for 3 days. If no improvement or worsen of compliant start Bactrim DS.

## 2016-11-15 ENCOUNTER — Other Ambulatory Visit: Payer: Self-pay | Admitting: *Deleted

## 2016-11-15 ENCOUNTER — Encounter: Payer: Self-pay | Admitting: Obstetrics and Gynecology

## 2016-11-15 MED ORDER — SERTRALINE HCL 100 MG PO TABS: 100.0000 mg | ORAL_TABLET | Freq: Every day | ORAL | 6 refills | Status: DC

## 2017-01-30 ENCOUNTER — Other Ambulatory Visit: Payer: Self-pay | Admitting: Obstetrics and Gynecology

## 2017-03-02 ENCOUNTER — Other Ambulatory Visit: Payer: Self-pay | Admitting: Obstetrics and Gynecology

## 2017-03-20 ENCOUNTER — Ambulatory Visit (INDEPENDENT_AMBULATORY_CARE_PROVIDER_SITE_OTHER): Payer: 59 | Admitting: Obstetrics and Gynecology

## 2017-03-20 ENCOUNTER — Other Ambulatory Visit: Payer: Self-pay | Admitting: Obstetrics and Gynecology

## 2017-03-20 ENCOUNTER — Encounter: Payer: Self-pay | Admitting: Obstetrics and Gynecology

## 2017-03-20 VITALS — BP 128/84 | HR 78 | Ht 59.0 in | Wt 156.7 lb

## 2017-03-20 DIAGNOSIS — N921 Excessive and frequent menstruation with irregular cycle: Secondary | ICD-10-CM | POA: Diagnosis not present

## 2017-03-20 DIAGNOSIS — E663 Overweight: Secondary | ICD-10-CM | POA: Diagnosis not present

## 2017-03-20 DIAGNOSIS — E039 Hypothyroidism, unspecified: Secondary | ICD-10-CM

## 2017-03-20 DIAGNOSIS — Z01419 Encounter for gynecological examination (general) (routine) without abnormal findings: Secondary | ICD-10-CM | POA: Diagnosis not present

## 2017-03-20 DIAGNOSIS — F410 Panic disorder [episodic paroxysmal anxiety] without agoraphobia: Secondary | ICD-10-CM | POA: Diagnosis not present

## 2017-03-20 DIAGNOSIS — Z01411 Encounter for gynecological examination (general) (routine) with abnormal findings: Secondary | ICD-10-CM | POA: Diagnosis not present

## 2017-03-20 MED ORDER — ETONOGESTREL-ETHINYL ESTRADIOL 0.12-0.015 MG/24HR VA RING: VAGINAL_RING | VAGINAL | 12 refills | Status: DC

## 2017-03-20 MED ORDER — FLUOXETINE HCL 20 MG PO CAPS: 20.0000 mg | ORAL_CAPSULE | Freq: Every day | ORAL | 3 refills | Status: DC

## 2017-03-20 NOTE — Progress Notes (Signed)
Subjective:   Paula [name] is a 36 y.o. 152P2002 Caucasian female here for a routine well-woman exam.  Patient's last menstrual period was 03/13/2017.    Current complaints: gained 16#s since stopped nursing 6 months ago. Still taking progestin-only pills. Had three menses in last 2 months. Passed clot 4 days ago. Also reports increased nightmares since increasing zoloft to 100mg . Is still having 1-2 panic attacks a month, lasting 3-4 hours severe at times.  PCP: me       doesn't desire labs  Social History: Sexual: heterosexual Marital Status: married Living situation: with family Occupation: Charity fundraiserN in ED at [employer] Tobacco/alcohol: no tobacco use Illicit drugs: no history of illicit drug use  The following portions of the patient's history were reviewed and updated as appropriate: allergies, current medications, past family history, past medical history, past social history, past surgical history and problem list.  Past Medical History Past Medical History:  Diagnosis Date  . Anemia   . Asthma    exercise induced  . BULIMIA 01/17/2008   Annotation: AGE 61 Qualifier: Diagnosis of  By: Lorrene Reidombs LPN, Burna MortimerWanda    . Complication of anesthesia    vomited during last c/s  . 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 Cystic fibrosis carrier in second trimester, antepartum 2016  . Depression   . Eczema   . GERD (gastroesophageal reflux disease)    chronic  . Headache    h/o migraines  . Hypothyroidism   . Nausea   . Oligomenorrhea 12/22/2014  . PONV (postoperative nausea and vomiting)   . Rhinitis, allergic     Past Surgical History Past Surgical History:  Procedure Laterality Date  . CESAREAN SECTION  04/19/2013   LTCS; CPD  . CESAREAN SECTION N/A 10/27/2015   Procedure: REPEAT CESAREAN SECTION;  Surgeon: Herold HarmsMartin A Defrancesco, MD;  Location: [facility] ORS;  Service: Obstetrics;  Laterality: N/A;  . TONSILLECTOMY      Gynecologic History [id number]  Patient's last menstrual period  was 03/13/2017. Contraception: oral progesterone-only contraceptive Last Pap: 2017. Results were: normal   Obstetric History OB History  Gravida Para Term Preterm AB Living  2 2 2     2   SAB TAB Ectopic Multiple Live Births        0 2    # Outcome Date GA Lbr Len/2nd Weight Sex Delivery Anes PTL Lv  2 Term 10/27/15 455w1d  7 lb 11.1 oz (3.49 kg) F CS-LTranv   LIV  1 Term 04/19/13 [id number]  7 lb 4 oz (3.289 kg) F CS-LTranv   LIV    Obstetric Comments  LTCS: small statue, CPD.     Current Medications Current Outpatient Prescriptions on File Prior to Visit  Medication Sig Dispense Refill  . cetirizine (ZYRTEC) 10 MG tablet Take 10 mg by mouth daily.    Marland Kitchen. levothyroxine (SYNTHROID, LEVOTHROID) 75 MCG tablet TAKE 1 TABLET BY MOUTH DAILY BEFORE BREAKFAST. 30 tablet 6  . norethindrone (MICRONOR,CAMILA,ERRIN) 0.35 MG tablet TAKE 1 TABLET (0.35 MG TOTAL) BY MOUTH DAILY. 28 tablet 11  . sertraline (ZOLOFT) 100 MG tablet Take 1 tablet (100 mg total) by mouth daily. 30 tablet 6  . Cholecalciferol (VITAMIN D3) 5000 units CAPS Take 1 capsule (5,000 Units total) by mouth daily. (Patient not taking: Reported on 07/26/2016) 120 capsule 2  . ibuprofen (ADVIL,MOTRIN) 800 MG tablet Take 1 tablet (800 mg total) by mouth every 8 (eight) hours. (Patient not taking: Reported on 07/26/2016) 60 tablet 1  . phenazopyridine (PYRIDIUM) 200 MG  tablet Take 1 tablet (200 mg total) by mouth 3 (three) times daily as needed for pain. (Patient not taking: Reported on 03/20/2017) 6 tablet 0  . Prenatal Vit-Fe Fumarate-FA (PRENATAL MULTIVITAMIN) TABS tablet Take 1 tablet by mouth daily at 12 noon.     No current facility-administered medications on file prior to visit.     Review of Systems Patient denies any headaches, blurred vision, shortness of breath, chest pain (except with panic attacks), abdominal pain, problems with bowel movements, urination, or intercourse.  Objective:  BP 128/84   Pulse 78   Ht 4\' 11"  (1.499  m)   Wt 156 lb 11.2 oz (71.1 kg)   LMP 03/13/2017   BMI 31.65 kg/m  Physical Exam  General:  Well developed, well nourished, no acute distress. She is alert and oriented x3. Skin:  Warm and dry Neck:  Midline trachea, no thyromegaly or nodules Cardiovascular: Regular rate and rhythm, no murmur heard Lungs:  Effort normal, all lung fields clear to auscultation bilaterally Breasts:  No dominant palpable mass, retraction, or nipple discharge Abdomen:  Soft, non tender, no hepatosplenomegaly or masses Pelvic:  External genitalia is normal in appearance.  The vagina is normal in appearance. The cervix is bulbous, no CMT.  Thin prep pap is not done . Uterus is felt to be normal size, shape, and contour.  No adnexal masses or tenderness noted. Extremities:  No swelling or varicosities noted Psych:  She has a normal mood and affect  Assessment:   Healthy well-woman exam Hypothyroidism BTB on OCPs Anxiety with panic attacks   Plan:  Switched to trial of Nuvaring.to start on 4th day of next menses. Also switched to Prozac 20mg , to start now. Will message me via MyChart in 3-4 weeks and let me know how she is doing on both. And will check BP weekly at work.  Labs obtained will follow up accordingly. F/U 1 year for AE, or sooner if needed   Melody Suzan Nailer, CNM

## 2017-03-20 NOTE — Patient Instructions (Signed)
Preventive Care 18-39 Years, Female Preventive care refers to lifestyle choices and visits with your health care provider that can promote health and wellness. What does preventive care include?  A yearly physical exam. This is also called an annual well check.  Dental exams once or twice a year.  Routine eye exams. Ask your health care provider how often you should have your eyes checked.  Personal lifestyle choices, including: ? Daily care of your teeth and gums. ? Regular physical activity. ? Eating a healthy diet. ? Avoiding tobacco and drug use. ? Limiting alcohol use. ? Practicing safe sex. ? Taking vitamin and mineral supplements as recommended by your health care provider. What happens during an annual well check? The services and screenings done by your health care provider during your annual well check will depend on your age, overall health, lifestyle risk factors, and family history of disease. Counseling Your health care provider may ask you questions about your:  Alcohol use.  Tobacco use.  Drug use.  Emotional well-being.  Home and relationship well-being.  Sexual activity.  Eating habits.  Work and work Statistician.  Method of birth control.  Menstrual cycle.  Pregnancy history.  Screening You may have the following tests or measurements:  Height, weight, and BMI.  Diabetes screening. This is done by checking your blood sugar (glucose) after you have not eaten for a while (fasting).  Blood pressure.  Lipid and cholesterol levels. These may be checked every 5 years starting at age 38.  Skin check.  Hepatitis C blood test.  Hepatitis B blood test.  Sexually transmitted disease (STD) testing.  BRCA-related cancer screening. This may be done if you have a family history of breast, ovarian, tubal, or peritoneal cancers.  Pelvic exam and Pap test. This may be done every 3 years starting at age 38. Starting at age 30, this may be done  every 5 years if you have a Pap test in combination with an HPV test.  Discuss your test results, treatment options, and if necessary, the need for more tests with your health care provider. Vaccines Your health care provider may recommend certain vaccines, such as:  Influenza vaccine. This is recommended every year.  Tetanus, diphtheria, and acellular pertussis (Tdap, Td) vaccine. You may need a Td booster every 10 years.  Varicella vaccine. You may need this if you have not been vaccinated.  HPV vaccine. If you are 39 or younger, you may need three doses over 6 months.  Measles, mumps, and rubella (MMR) vaccine. You may need at least one dose of MMR. You may also need a second dose.  Pneumococcal 13-valent conjugate (PCV13) vaccine. You may need this if you have certain conditions and were not previously vaccinated.  Pneumococcal polysaccharide (PPSV23) vaccine. You may need one or two doses if you smoke cigarettes or if you have certain conditions.  Meningococcal vaccine. One dose is recommended if you are age 68-21 years and a first-year college student living in a residence hall, or if you have one of several medical conditions. You may also need additional booster doses.  Hepatitis A vaccine. You may need this if you have certain conditions or if you travel or work in places where you may be exposed to hepatitis A.  Hepatitis B vaccine. You may need this if you have certain conditions or if you travel or work in places where you may be exposed to hepatitis B.  Haemophilus influenzae type b (Hib) vaccine. You may need this  if you have certain risk factors.  Talk to your health care provider about which screenings and vaccines you need and how often you need them. This information is not intended to replace advice given to you by your health care provider. Make sure you discuss any questions you have with your health care provider. Document Released: 07/11/2001 Document Revised:  02/02/2016 Document Reviewed: 03/16/2015 Elsevier Interactive Patient Education  2017 Elsevier Inc.  

## 2017-03-21 ENCOUNTER — Encounter: Payer: Self-pay | Admitting: Obstetrics and Gynecology

## 2017-03-21 LAB — THYROID PANEL WITH TSH
FREE THYROXINE INDEX: 1.7 (ref 1.2–4.9)
T3 Uptake Ratio: 29 % (ref 24–39)
T4, Total: 5.9 ug/dL (ref 4.5–12.0)
TSH: 1.66 u[IU]/mL (ref 0.450–4.500)

## 2017-03-21 LAB — LIPID PANEL
CHOLESTEROL TOTAL: 234 mg/dL — AB (ref 100–199)
Chol/HDL Ratio: 5 ratio — ABNORMAL HIGH (ref 0.0–4.4)
HDL: 47 mg/dL (ref >39)
LDL Calculated: 154 mg/dL — ABNORMAL HIGH (ref 0–99)
TRIGLYCERIDES: 163 mg/dL — AB (ref 0–149)
VLDL CHOLESTEROL CAL: 33 mg/dL (ref 5–40)

## 2017-03-21 LAB — CBC
Hematocrit: 40.9 % (ref 34.0–46.6)
Hemoglobin: 14.1 g/dL (ref 11.1–15.9)
MCH: 30.2 pg (ref 26.6–33.0)
MCHC: 34.5 g/dL (ref 31.5–35.7)
MCV: 88 fL (ref 79–97)
PLATELETS: 326 10*3/uL (ref 150–379)
RBC: 4.67 x10E6/uL (ref 3.77–5.28)
RDW: 13.3 % (ref 12.3–15.4)
WBC: 10.1 10*3/uL (ref 3.4–10.8)

## 2017-03-21 LAB — COMPREHENSIVE METABOLIC PANEL
A/G RATIO: 2 (ref 1.2–2.2)
ALBUMIN: 4.9 g/dL (ref 3.5–5.5)
ALK PHOS: 76 IU/L (ref 39–117)
ALT: 23 IU/L (ref 0–32)
AST: 18 IU/L (ref 0–40)
BILIRUBIN TOTAL: 0.8 mg/dL (ref 0.0–1.2)
BUN / CREAT RATIO: 14 (ref 9–23)
BUN: 11 mg/dL (ref 6–20)
CO2: 27 mmol/L (ref 20–29)
CREATININE: 0.81 mg/dL (ref 0.57–1.00)
Calcium: 9.9 mg/dL (ref 8.7–10.2)
Chloride: 103 mmol/L (ref 96–106)
GFR calc Af Amer: 110 mL/min/{1.73_m2} (ref >59)
GFR calc non Af Amer: 95 mL/min/{1.73_m2} (ref >59)
GLOBULIN, TOTAL: 2.4 g/dL (ref 1.5–4.5)
Glucose: 68 mg/dL (ref 65–99)
Potassium: 4.1 mmol/L (ref 3.5–5.2)
SODIUM: 145 mmol/L — AB (ref 134–144)
Total Protein: 7.3 g/dL (ref 6.0–8.5)

## 2017-03-21 LAB — FERRITIN: Ferritin: 48 ng/mL (ref 15–150)

## 2017-03-22 ENCOUNTER — Telehealth: Payer: 59 | Admitting: Family

## 2017-03-22 DIAGNOSIS — J029 Acute pharyngitis, unspecified: Secondary | ICD-10-CM

## 2017-03-22 LAB — CYTOLOGY - PAP

## 2017-03-22 MED ORDER — PREDNISONE 5 MG PO TABS: 5.0000 mg | ORAL_TABLET | ORAL | 0 refills | Status: DC

## 2017-03-22 MED ORDER — BENZONATATE 100 MG PO CAPS: 100.0000 mg | ORAL_CAPSULE | Freq: Three times a day (TID) | ORAL | 0 refills | Status: DC | PRN

## 2017-03-22 NOTE — Progress Notes (Signed)
Thank you for the details you included in the comment boxes. Those details are very helpful in determining the best course of treatment for you and help us to provide the best care. The template below will say cough but it is designed for sinus/cough/pharyngitis infections.  We are sorry that you are not feeling well.  Here is how we plan to help!  Based on your presentation I believe you most likely have A cough due to a virus.  This is called viral bronchitis and is best treated by rest, plenty of fluids and control of the cough.  You may use Ibuprofen or Tylenol as directed to help your symptoms.     In addition you may use A non-prescription cough medication called Mucinex DM: take 2 tablets every 12 hours. and A prescription cough medication called Tessalon Perles 100mg . You may take 1-2 capsules every 8 hours as needed for your cough.  Sterapred 5 mg dosepak  From your responses in the eVisit questionnaire you describe inflammation in the upper respiratory tract which is causing a significant cough.  This is commonly called Bronchitis and has four common causes:    Allergies  Viral Infections  Acid Reflux  Bacterial Infection Allergies, viruses and acid reflux are treated by controlling symptoms or eliminating the cause. An example might be a cough caused by taking certain blood pressure medications. You stop the cough by changing the medication. Another example might be a cough caused by acid reflux. Controlling the reflux helps control the cough.  USE OF BRONCHODILATOR ("RESCUE") INHALERS: There is a risk from using your bronchodilator too frequently.  The risk is that over-reliance on a medication which only relaxes the muscles surrounding the breathing tubes can reduce the effectiveness of medications prescribed to reduce swelling and congestion of the tubes themselves.  Although you feel brief relief from the bronchodilator inhaler, your asthma may actually be worsening with the  tubes becoming more swollen and filled with mucus.  This can delay other crucial treatments, such as oral steroid medications. If you need to use a bronchodilator inhaler daily, several times per day, you should discuss this with your provider.  There are probably better treatments that could be used to keep your asthma under control.     HOME CARE . Only take medications as instructed by your medical team. . Complete the entire course of an antibiotic. . Drink plenty of fluids and get plenty of rest. . Avoid close contacts especially the very young and the elderly . Cover your mouth if you cough or cough into your sleeve. . Always remember to wash your hands . A steam or ultrasonic humidifier can help congestion.   GET HELP RIGHT AWAY IF: . You develop worsening fever. . You become short of breath . You cough up blood. . Your symptoms persist after you have completed your treatment plan MAKE SURE YOU   Understand these instructions.  Will watch your condition.  Will get help right away if you are not doing well or get worse.  Your e-visit answers were reviewed by a board certified advanced clinical practitioner to complete your personal care plan.  Depending on the condition, your plan could have included both over the counter or prescription medications. If there is a problem please reply  once you have received a response from your provider. Your safety is important to us.  If you have drug allergies check your prescription carefully.    You can use MyChart to ask questions  about today's visit, request a non-urgent call back, or ask for a work or school excuse for 24 hours related to this e-Visit. If it has been greater than 24 hours you will need to follow up with your provider, or enter a new e-Visit to address those concerns. You will get an e-mail in the next two days asking about your experience.  I hope that your e-visit has been valuable and will speed your recovery. Thank you  for using e-visits.

## 2017-03-28 ENCOUNTER — Encounter: Payer: Self-pay | Admitting: Physician Assistant

## 2017-03-28 ENCOUNTER — Ambulatory Visit: Payer: Self-pay | Admitting: Physician Assistant

## 2017-03-28 VITALS — BP 130/82 | HR 84 | Temp 99.8°F

## 2017-03-28 DIAGNOSIS — J069 Acute upper respiratory infection, unspecified: Secondary | ICD-10-CM

## 2017-03-28 MED ORDER — CEFDINIR 300 MG PO CAPS: 300.0000 mg | ORAL_CAPSULE | Freq: Two times a day (BID) | ORAL | 0 refills | Status: DC

## 2017-03-28 MED ORDER — FLUCONAZOLE 150 MG PO TABS: ORAL_TABLET | ORAL | 0 refills | Status: DC

## 2017-03-28 NOTE — Progress Notes (Signed)
S: C/o runny nose and congestion for 8 days, + fever, chills, denies cp/sob, v/d; mucus was green and brown,  Children have also been sick with high fevers Using otc meds:   O: PE: vitals wnl, nad, perrl eomi, normocephalic, tms dull, nasal mucosa red and swollen, throat injected, neck supple no lymph, lungs c t a, cv rrr, neuro intact  A:  Acute uri   P: drink fluids, continue regular meds , use otc meds of choice, return if not improving in 5 days, return earlier if worsening , omnicef, diflucan

## 2017-04-28 ENCOUNTER — Encounter: Payer: Self-pay | Admitting: Obstetrics and Gynecology

## 2017-04-30 ENCOUNTER — Other Ambulatory Visit: Payer: Self-pay | Admitting: *Deleted

## 2017-04-30 MED ORDER — FLUOXETINE HCL 20 MG PO CAPS: 20.0000 mg | ORAL_CAPSULE | Freq: Every day | ORAL | 3 refills | Status: DC

## 2017-05-04 ENCOUNTER — Ambulatory Visit: Payer: Self-pay | Admitting: Physician Assistant

## 2017-05-04 ENCOUNTER — Encounter: Payer: Self-pay | Admitting: Physician Assistant

## 2017-05-04 ENCOUNTER — Ambulatory Visit
Admission: RE | Admit: 2017-05-04 | Discharge: 2017-05-04 | Disposition: A | Discharge status: Home / Self Care | Payer: 59 | Source: Ambulatory Visit | Attending: Physician Assistant | Admitting: Physician Assistant

## 2017-05-04 VITALS — BP 140/80 | HR 88 | Temp 97.3°F

## 2017-05-04 DIAGNOSIS — R05 Cough: Secondary | ICD-10-CM | POA: Diagnosis not present

## 2017-05-04 DIAGNOSIS — R059 Cough, unspecified: Secondary | ICD-10-CM

## 2017-05-04 NOTE — Progress Notes (Signed)
   Subjective: Cough     Patient ID: Paula [name], female    DOB: 02/11/1981, 36 y.o.   MRN: [id number]  HPI Patient complaining of coughing and wheezing for 2 weeks. Patient has history of seasonal rhinitis. Patient recently seen in the ED and given a prescription for Omnicef, prednisone and Tessalon Perles. Patient continued to cough. Patient state intermittent fever/chills. Patient also complaining of nasal congestion and facial pain.   Review of Systems  Negative history for complaint    Objective:   Physical Exam Nonproductive cough throughout the exam. HEENT remarkable for edematous nasal turbinates postnasal drainage. Neck is supple without adenopathy. Lungs bilateral Rhonchi breath sounds. Heart regular rate and rhythm. Chest x-ray revealed no acute cardiopulmonary findings       Assessment & Plan: Cough secondary to bronchospasm.   Advised to continue previous medication and to take Tussionex at night.

## 2017-05-10 ENCOUNTER — Other Ambulatory Visit: Payer: Self-pay | Admitting: Obstetrics and Gynecology

## 2017-05-10 MED ORDER — FLUOXETINE HCL 20 MG PO CAPS: 20.0000 mg | ORAL_CAPSULE | Freq: Two times a day (BID) | ORAL | 3 refills | Status: DC

## 2017-05-11 ENCOUNTER — Ambulatory Visit: Payer: Self-pay | Admitting: Emergency Medicine

## 2017-05-11 ENCOUNTER — Telehealth: Payer: 59 | Admitting: Family

## 2017-05-11 VITALS — BP 110/80 | HR 100 | Temp 98.3°F

## 2017-05-11 DIAGNOSIS — J0101 Acute recurrent maxillary sinusitis: Secondary | ICD-10-CM

## 2017-05-11 DIAGNOSIS — J329 Chronic sinusitis, unspecified: Secondary | ICD-10-CM

## 2017-05-11 MED ORDER — LEVOFLOXACIN 500 MG PO TABS: 500.0000 mg | ORAL_TABLET | Freq: Every day | ORAL | 0 refills | Status: DC

## 2017-05-11 MED ORDER — PREDNISONE 10 MG PO TABS: ORAL_TABLET | ORAL | 0 refills | Status: DC

## 2017-05-11 NOTE — Progress Notes (Signed)
Thank you for the details you included in the comment boxes. Those details are very helpful in determining the best course of treatment for you and help us to provide the best care. As this is the 4th visit for the same condition and it has been going on for 8 weeks, I have to advise that the E-Visit program is not equipped to properly evaluate and examine you. This could be something more serious and requires a full work-up to see what has been missed the last 3 encounters, to where you didn't improve.  Based on what you shared with me it looks like you have a serious condition that should be evaluated in a face to face office visit.  NOTE: Even if you have entered your credit card information for this eVisit, you will not be charged.   If you are having a true medical emergency please call 911.  If you need an urgent face to face visit, [facility] has four urgent care centers for your convenience.  If you need care fast and have a high deductible or no insurance consider:   [email or web address]  [phone]  [address], [address] [place], [place] [address] 8 am to 8 pm Monday-Friday 10 am to 4 pm Saturday-Sunday   The following sites will take your  insurance:    . [facility]  [phone] Get Driving Directions Find a Provider at this Location  [address] [place], [place] [address] . 10 am to 8 pm Monday-Friday . 12 pm to 8 pm Saturday-Sunday   . [facility]  [phone] Get Driving Directions Find a Provider at this Location  [address], [address] [place], [place] [address] . 8 am to 8 pm Monday-Friday . 9 am to 6 pm Saturday . 11 am to 6 pm Sunday   . [facility]  [phone] Get Driving Directions  [address] [place], [place] [address] . 8 am to 8 pm Monday-Friday . 8 am to 4 pm Saturday-Sunday   Your e-visit answers were  reviewed by a board certified advanced clinical practitioner to complete your personal care plan.  Thank you for using e-Visits.

## 2017-05-11 NOTE — Progress Notes (Signed)
S: Comes in today with complaint of facial pressure, teeth pain, fever, cough, posterior drainage.  Patient has a history of multiple sinusitis.  In the last couple months she has been treated with Augmentin, Omnicef, and prednisone without complete relief.  Patient has been using Nettie pot lavage and over-the-counter medication without any relief.  Today she states her teeth are hurting.  She also would like to get a referral to [facility] for possible sinus surgery. O: TMs are dull bilaterally with poor light reflex.  No erythema or injection noted.  Nasal mucosa boggy with erythema.  Right naris is decreased in comparison to the left.  Moderate drainage seen posterior pharynx.  Moderate tenderness on percussion of the maxillary sinuses bilaterally.  Neck is supple without adenopathy.  Lungs are clear bilaterally.  Heart regular rate and rhythm. A: Recurrent maxillary sinusitis P: Medications were reviewed and patient has not been on any of the quinolones in the past.  In talking with her she is not gotten complete relief on Augmentin or Omnicef.  Patient was given a prescription for Levaquin 500 mg 1 daily for 7 days and prednisone 30 mg once daily for the next 5 days.  She will continue Flonase nasal spray.  We will refer to [facility] for evaluation.  Patient was made aware that there is possibility of tendon rupture with the Levaquin.

## 2017-06-13 DIAGNOSIS — J329 Chronic sinusitis, unspecified: Secondary | ICD-10-CM | POA: Insufficient documentation

## 2017-06-13 DIAGNOSIS — J45990 Exercise induced bronchospasm: Secondary | ICD-10-CM | POA: Insufficient documentation

## 2017-06-13 DIAGNOSIS — F411 Generalized anxiety disorder: Secondary | ICD-10-CM | POA: Insufficient documentation

## 2017-06-29 ENCOUNTER — Ambulatory Visit (INDEPENDENT_AMBULATORY_CARE_PROVIDER_SITE_OTHER): Payer: Self-pay | Admitting: Psychiatry

## 2017-06-29 ENCOUNTER — Encounter: Payer: Self-pay | Admitting: Psychiatry

## 2017-06-29 VITALS — BP 129/78 | HR 76 | Temp 99.3°F | Wt 158.0 lb

## 2017-06-29 DIAGNOSIS — J101 Influenza due to other identified influenza virus with other respiratory manifestations: Secondary | ICD-10-CM

## 2017-06-29 LAB — POCT INFLUENZA A/B
INFLUENZA B, POC: NEGATIVE
Influenza A, POC: POSITIVE — AB

## 2017-06-29 NOTE — Progress Notes (Signed)
Subjective:     Paula [name] is a 37 y.o. female who presents for evaluation of influenza like symptoms. Symptoms include fevers up to 100.1 degrees, chills, headache, myalgias and productive cough and have been present for 1 day. She has tried to alleviate the symptoms with acetaminophen and ibuprofen with minimal relief. High risk factors for influenza complications: high exposures to .  The following portions of the patient's history were reviewed and updated as appropriate: allergies, current medications, past family history, past medical history, past social history, past surgical history and problem list.  Review of Systems Pertinent items are noted in HPI.     Objective:   Results for orders placed or performed in visit on 06/29/17 (from the past 24 hour(s))  POCT Influenza A/B     Status: Abnormal   Collection Time: 06/29/17  3:59 PM  Result Value Ref Range   Influenza A, POC Positive (A) Negative   Influenza B, POC Negative Negative   Vitals:   06/29/17 1548  BP: 129/78  Pulse: 76  Temp: 99.3 F (37.4 C)  SpO2: 98%     General appearance: alert, cooperative and no distress Head: Normocephalic, without obvious abnormality, atraumatic Eyes: conjunctivae/corneas clear. PERRL, EOM's intact. Fundi benign. Ears: left ear with dry waxy material, right ear appears normal Nose: mild congestion, right turbinate pink, left turbinate pink Throat: lips, mucosa, and tongue normal; teeth and gums normal Neck: no adenopathy, no carotid bruit, no JVD, supple, symmetrical, trachea midline and thyroid not enlarged, symmetric, no tenderness/mass/nodules Lungs: clear to auscultation bilaterally Heart: regular rate and rhythm, S1, S2 normal, no murmur, click, rub or gallop    Assessment:    Influenza    Plan:       Supportive care with appropriate antipyretics and fluids. Follow up as needed.  Patient Instructions  Influenza, Adult Influenza ("the flu") is an infection in  the lungs, nose, and throat (respiratory tract). It is caused by a virus. The flu causes many common cold symptoms, as well as a high fever and body aches. It can make you feel very sick. The flu spreads easily from person to person (is contagious). Getting a flu shot (influenza vaccination) every year is the best way to prevent the flu. Follow these instructions at home:  Take over-the-counter and prescription medicines only as told by your doctor.  Use a cool mist humidifier to add moisture (humidity) to the air in your home. This can make it easier to breathe.  Rest as needed.  Drink enough fluid to keep your pee (urine) clear or pale yellow.  Cover your mouth and nose when you cough or sneeze.  Wash your hands with soap and water often, especially after you cough or sneeze. If you cannot use soap and water, use hand sanitizer.  Stay home from work or school as told by your doctor. Unless you are visiting your doctor, try to avoid leaving home until your fever has been gone for 24 hours without the use of medicine.  Keep all follow-up visits as told by your doctor. This is important. How is this prevented?  Getting a yearly (annual) flu shot is the best way to avoid getting the flu. You may get the flu shot in late summer, fall, or winter. Ask your doctor when you should get your flu shot.  Wash your hands often or use hand sanitizer often.  Avoid contact with people who are sick during cold and flu season.  Eat healthy foods.  Drink plenty of fluids.  Get enough sleep.  Exercise regularly. Contact a doctor if:  You get new symptoms.  You have: ? Chest pain. ? Watery poop (diarrhea). ? A fever.  Your cough gets worse.  You start to have more mucus.  You feel sick to your stomach (nauseous).  You throw up (vomit). Get help right away if:  You start to be short of breath or have trouble breathing.  Your skin or nails turn a bluish color.  You have very bad  pain or stiffness in your neck.  You get a sudden headache.  You get sudden pain in your face or ear.  You cannot stop throwing up. This information is not intended to replace advice given to you by your health care provider. Make sure you discuss any questions you have with your health care provider. Document Released: 02/22/2008 Document Revised: 10/21/2015 Document Reviewed: 03/09/2015 Elsevier Interactive Patient Education  2017 [employer].

## 2017-06-29 NOTE — Patient Instructions (Signed)

## 2017-07-24 NOTE — Discharge Instructions (Signed)
[facility] °[facility] °ENDOSCOPIC SINUS SURGERY °[facility] ° °What is Functional Endoscopic Sinus Surgery? ° The Surgery involves making the natural openings of the sinuses larger by removing the bony partitions that separate the sinuses from the nasal cavity.  The natural sinus lining is preserved as much as possible to allow the sinuses to resume normal function after the surgery.  In some patients nasal polyps (excessively swollen lining of the sinuses) may be removed to relieve obstruction of the sinus openings.  The surgery is performed through the nose using lighted scopes, which eliminates the need for incisions on the face.  A septoplasty is a different procedure which is sometimes performed with sinus surgery.  It involves straightening the boy partition that separates the two sides of your nose.  A crooked or deviated septum may need repair if is obstructing the sinuses or nasal airflow.  Turbinate reduction is also often performed during sinus surgery.  The turbinates are bony proturberances from the side walls of the nose which swell and can obstruct the nose in patients with sinus and allergy problems.  Their size can be surgically reduced to help relieve nasal obstruction. ° °What Can Sinus Surgery Do For Me? ° Sinus surgery can reduce the frequency of sinus infections requiring antibiotic treatment.  This can provide improvement in nasal congestion, post-nasal drainage, facial pressure and nasal obstruction.  Surgery will NOT prevent you from ever having an infection again, so it usually only for patients who get infections 4 or more times yearly requiring antibiotics, or for infections that do not clear with antibiotics.  It will not cure nasal allergies, so patients with allergies may still require medication to treat their allergies after surgery. Surgery may improve headaches related to sinusitis, however, some people will continue to  require medication to control sinus headaches related to allergies.  Surgery will do nothing for other forms of headache (migraine, tension or cluster). ° °What Are the Risks of Endoscopic Sinus Surgery? ° Current techniques allow surgery to be performed safely with little risk, however, there are rare complications that patients should be aware of.  Because the sinuses are located around the eyes, there is risk of eye injury, including blindness, though again, this would be quite rare. This is usually a result of bleeding behind the eye during surgery, which puts the vision oat risk, though there are treatments to protect the vision and prevent permanent disrupted by surgery causing a leak of the spinal fluid that surrounds the brain.  More serious complications would include bleeding inside the brain cavity or damage to the brain.  Again, all of these complications are uncommon, and spinal fluid leaks can be safely managed surgically if they occur.  The most common complication of sinus surgery is bleeding from the nose, which may require packing or cauterization of the nose.  Continued sinus have polyps may experience recurrence of the polyps requiring revision surgery.  Alterations of sense of smell or injury to the tear ducts are also rare complications.  ° °What is the Surgery Like, and what is the Recovery? ° The Surgery usually takes a couple of hours to perform, and is usually performed under a general anesthetic (completely asleep).  Patients are usually discharged home after a couple of hours.  Sometimes during surgery it is necessary to pack the nose to control bleeding, and the packing is left in place for 24 - 48 hours, and removed by your surgeon.    If a septoplasty was performed during the procedure, there is often a splint placed which must be removed after 5-7 days.   °Discomfort: Pain is usually mild to moderate, and can be controlled by prescription pain medication or acetaminophen (Tylenol).   Aspirin, Ibuprofen (Advil, Motrin), or Naprosyn (Aleve) should be avoided, as they can cause increased bleeding.  Most patients feel sinus pressure like they have a bad head cold for several days.  Sleeping with your head elevated can help reduce swelling and facial pressure, as can ice packs over the face.  A humidifier may be helpful to keep the mucous and blood from drying in the nose.  ° °Diet: There are no specific diet restrictions, however, you should generally start with clear liquids and a light diet of bland foods because the anesthetic can cause some nausea.  Advance your diet depending on how your stomach feels.  Taking your pain medication with food will often help reduce stomach upset which pain medications can cause. ° °Nasal Saline Irrigation: It is important to remove blood clots and dried mucous from the nose as it is healing.  This is done by having you irrigate the nose at least 3 - 4 times daily with a salt water solution.  We recommend using NeilMed Sinus Rinse (available at the drug store).  Fill the squeeze bottle with the solution, bend over a sink, and insert the tip of the squeeze bottle into the nose ½ of an inch.  Point the tip of the squeeze bottle towards the inside corner of the eye on the same side your irrigating.  Squeeze the bottle and gently irrigate the nose.  If you bend forward as you do this, most of the fluid will flow back out of the nose, instead of down your throat.   The solution should be warm, near body temperature, when you irrigate.   Each time you irrigate, you should use a full squeeze bottle.  ° °Note that if you are instructed to use Nasal Steroid Sprays at any time after your surgery, irrigate with saline BEFORE using the steroid spray, so you do not wash it all out of the nose. °Another product, Nasal Saline Gel (such as AYR Nasal Saline Gel) can be applied in each nostril 3 - 4 times daily to moisture the nose and reduce scabbing or crusting. ° °Bleeding:   Bloody drainage from the nose can be expected for several days, and patients are instructed to irrigate their nose frequently with salt water to help remove mucous and blood clots.  The drainage may be dark red or brown, though some fresh blood may be seen intermittently, especially after irrigation.  Do not blow you nose, as bleeding may occur. If you must sneeze, keep your mouth open to allow air to escape through your mouth. ° °If heavy bleeding occurs: Irrigate the nose with saline to rinse out clots, then spray the nose 3 - 4 times with Afrin Nasal Decongestant Spray.  The spray will constrict the blood vessels to slow bleeding.  Pinch the lower half of your nose shut to apply pressure, and lay down with your head elevated.  Ice packs over the nose may help as well. If bleeding persists despite these measures, you should notify your doctor.  Do not use the Afrin routinely to control nasal congestion after surgery, as it can result in worsening congestion and may affect healing.  ° ° ° °Activity: Return to work varies among patients. Most patients will be   out of work at least 5 - 7 days to recover.  Patient may return to work after they are off of narcotic pain medication, and feeling well enough to perform the functions of their job.  Patients must avoid heavy lifting (over 10 pounds) or strenuous physical for 2 weeks after surgery, so your employer may need to assign you to light duty, or keep you out of work longer if light duty is not possible.  NOTE: you should not drive, operate dangerous machinery, do any mentally demanding tasks or make any important legal or financial decisions while on narcotic pain medication and recovering from the general anesthetic.  °  °Call Your Doctor Immediately if You Have Any of the Following: °1. Bleeding that you cannot control with the above measures °2. Loss of vision, double vision, bulging of the eye or black eyes. °3. Fever over 101 degrees °4. Neck stiffness with  severe headache, fever, nausea and change in mental state. °You are always encourage to call anytime with concerns, however, please call with requests for pain medication refills during office hours. ° °Office Endoscopy: During follow-up visits your doctor will remove any packing or splints that may have been placed and evaluate and clean your sinuses endoscopically.  Topical anesthetic will be used to make this as comfortable as possible, though you may want to take your pain medication prior to the visit.  How often this will need to be done varies from patient to patient.  After complete recovery from the surgery, you may need follow-up endoscopy from time to time, particularly if there is concern of recurrent infection or nasal polyps. ° ° °General Anesthesia, Adult, Care After °These instructions provide you with information about caring for yourself after your procedure. Your health care provider may also give you more specific instructions. Your treatment has been planned according to current medical practices, but problems sometimes occur. Call your health care provider if you have any problems or questions after your procedure. °What can I expect after the procedure? °After the procedure, it is common to have: °· Vomiting. °· A sore throat. °· Mental slowness. ° °It is common to feel: °· Nauseous. °· Cold or shivery. °· Sleepy. °· Tired. °· Sore or achy, even in parts of your body where you did not have surgery. ° °Follow these instructions at home: °For at least 24 hours after the procedure: °· Do not: °? Participate in activities where you could fall or become injured. °? Drive. °? Use heavy machinery. °? Drink alcohol. °? Take sleeping pills or medicines that cause drowsiness. °? Make important decisions or sign legal documents. °? Take care of children on your own. °· Rest. °Eating and drinking °· If you vomit, drink water, juice, or soup when you can drink without vomiting. °· Drink enough fluid to  keep your urine clear or pale yellow. °· Make sure you have little or no nausea before eating solid foods. °· Follow the diet recommended by your health care provider. °General instructions °· Have a responsible adult stay with you until you are awake and alert. °· Return to your normal activities as told by your health care provider. Ask your health care provider what activities are safe for you. °· Take over-the-counter and prescription medicines only as told by your health care provider. °· If you smoke, do not smoke without supervision. °· Keep all follow-up visits as told by your health care provider. This is important. °Contact a health care provider if: °· You   continue to have nausea or vomiting at home, and medicines are not helpful. °· You cannot drink fluids or start eating again. °· You cannot urinate after 8-12 hours. °· You develop a skin rash. °· You have fever. °· You have increasing redness at the site of your procedure. °Get help right away if: °· You have difficulty breathing. °· You have chest pain. °· You have unexpected bleeding. °· You feel that you are having a life-threatening or urgent problem. °This information is not intended to replace advice given to you by your health care provider. Make sure you discuss any questions you have with your health care provider. °Document Released: 08/21/2000 Document Revised: 10/18/2015 Document Reviewed: 04/29/2015 °Elsevier Interactive Patient Education © 2018 [employer] ° °

## 2017-07-26 ENCOUNTER — Ambulatory Visit
Admission: RE | Admit: 2017-07-26 | Discharge: 2017-07-26 | Disposition: A | Discharge status: Home / Self Care | Payer: No Typology Code available for payment source | Source: Ambulatory Visit | Attending: Otolaryngology | Admitting: Otolaryngology

## 2017-07-26 ENCOUNTER — Ambulatory Visit: Payer: No Typology Code available for payment source | Admitting: Anesthesiology

## 2017-07-26 ENCOUNTER — Encounter
Admission: RE | Disposition: A | Discharge status: Home / Self Care | Payer: Self-pay | Source: Ambulatory Visit | Attending: Otolaryngology

## 2017-07-26 DIAGNOSIS — Z79899 Other long term (current) drug therapy: Secondary | ICD-10-CM | POA: Insufficient documentation

## 2017-07-26 DIAGNOSIS — J343 Hypertrophy of nasal turbinates: Secondary | ICD-10-CM | POA: Insufficient documentation

## 2017-07-26 DIAGNOSIS — Z91013 Allergy to seafood: Secondary | ICD-10-CM | POA: Diagnosis not present

## 2017-07-26 DIAGNOSIS — J328 Other chronic sinusitis: Secondary | ICD-10-CM | POA: Diagnosis not present

## 2017-07-26 DIAGNOSIS — J342 Deviated nasal septum: Secondary | ICD-10-CM | POA: Insufficient documentation

## 2017-07-26 DIAGNOSIS — Z888 Allergy status to other drugs, medicaments and biological substances status: Secondary | ICD-10-CM | POA: Insufficient documentation

## 2017-07-26 DIAGNOSIS — G43909 Migraine, unspecified, not intractable, without status migrainosus: Secondary | ICD-10-CM | POA: Insufficient documentation

## 2017-07-26 DIAGNOSIS — D649 Anemia, unspecified: Secondary | ICD-10-CM | POA: Insufficient documentation

## 2017-07-26 DIAGNOSIS — J329 Chronic sinusitis, unspecified: Secondary | ICD-10-CM | POA: Diagnosis not present

## 2017-07-26 DIAGNOSIS — Z8249 Family history of ischemic heart disease and other diseases of the circulatory system: Secondary | ICD-10-CM | POA: Diagnosis not present

## 2017-07-26 DIAGNOSIS — J45909 Unspecified asthma, uncomplicated: Secondary | ICD-10-CM | POA: Diagnosis not present

## 2017-07-26 DIAGNOSIS — L309 Dermatitis, unspecified: Secondary | ICD-10-CM | POA: Diagnosis not present

## 2017-07-26 DIAGNOSIS — E039 Hypothyroidism, unspecified: Secondary | ICD-10-CM | POA: Diagnosis not present

## 2017-07-26 DIAGNOSIS — K219 Gastro-esophageal reflux disease without esophagitis: Secondary | ICD-10-CM | POA: Insufficient documentation

## 2017-07-26 DIAGNOSIS — J321 Chronic frontal sinusitis: Secondary | ICD-10-CM | POA: Diagnosis not present

## 2017-07-26 DIAGNOSIS — F329 Major depressive disorder, single episode, unspecified: Secondary | ICD-10-CM | POA: Insufficient documentation

## 2017-07-26 DIAGNOSIS — Z91041 Radiographic dye allergy status: Secondary | ICD-10-CM | POA: Insufficient documentation

## 2017-07-26 DIAGNOSIS — J322 Chronic ethmoidal sinusitis: Secondary | ICD-10-CM | POA: Insufficient documentation

## 2017-07-26 DIAGNOSIS — E282 Polycystic ovarian syndrome: Secondary | ICD-10-CM | POA: Insufficient documentation

## 2017-07-26 DIAGNOSIS — J320 Chronic maxillary sinusitis: Secondary | ICD-10-CM | POA: Insufficient documentation

## 2017-07-26 HISTORY — PX: IMAGE GUIDED SINUS SURGERY: SHX6570

## 2017-07-26 HISTORY — PX: SEPTOPLASTY WITH ETHMOIDECTOMY, AND MAXILLARY ANTROSTOMY: SHX6090

## 2017-07-26 HISTORY — PX: ENDOSCOPIC TURBINATE REDUCTION: SHX6489

## 2017-07-26 HISTORY — PX: FRONTAL SINUS EXPLORATION: SHX6591

## 2017-07-26 SURGERY — SINUS SURGERY, WITH IMAGING GUIDANCE
Anesthesia: General | Site: Nose | Laterality: Bilateral | Wound class: Clean Contaminated

## 2017-07-26 MED ORDER — FENTANYL CITRATE (PF) 100 MCG/2ML IJ SOLN
25.0000 ug | INTRAMUSCULAR | Status: AC | PRN
  Administered 2017-07-26: 25 ug via INTRAVENOUS
  Administered 2017-07-26: 50 ug via INTRAVENOUS
  Administered 2017-07-26: 25 ug via INTRAVENOUS
  Administered 2017-07-26: 100 ug via INTRAVENOUS
  Administered 2017-07-26 (×2): 50 ug via INTRAVENOUS

## 2017-07-26 MED ORDER — OXYCODONE HCL 5 MG PO TABS: 5.0000 mg | ORAL_TABLET | Freq: Once | ORAL | Status: AC | PRN

## 2017-07-26 MED ORDER — DEXTROSE 5 % IV SOLN
2000.0000 mg | Freq: Once | INTRAVENOUS | Status: AC
  Administered 2017-07-26: 2000 mg via INTRAVENOUS

## 2017-07-26 MED ORDER — LIDOCAINE-EPINEPHRINE 1 %-1:100000 IJ SOLN
INTRAMUSCULAR | Status: DC | PRN
  Administered 2017-07-26: 4.5 mL
  Administered 2017-07-26: 3 mL

## 2017-07-26 MED ORDER — ACETAMINOPHEN 10 MG/ML IV SOLN: 1000.0000 mg | Freq: Once | INTRAVENOUS | Status: DC | PRN

## 2017-07-26 MED ORDER — PROMETHAZINE HCL 25 MG/ML IJ SOLN
6.2500 mg | Freq: Once | INTRAMUSCULAR | Status: AC | PRN
  Administered 2017-07-26: 6.25 mg via INTRAVENOUS

## 2017-07-26 MED ORDER — ONDANSETRON HCL 4 MG/2ML IJ SOLN
4.0000 mg | Freq: Once | INTRAMUSCULAR | Status: AC | PRN
  Administered 2017-07-26 (×2): 4 mg via INTRAVENOUS

## 2017-07-26 MED ORDER — LIDOCAINE HCL (CARDIAC) 20 MG/ML IV SOLN
INTRAVENOUS | Status: DC | PRN
  Administered 2017-07-26: 50 mg via INTRAVENOUS

## 2017-07-26 MED ORDER — ACETAMINOPHEN 10 MG/ML IV SOLN
1000.0000 mg | Freq: Once | INTRAVENOUS | Status: AC
  Administered 2017-07-26: 1000 mg via INTRAVENOUS

## 2017-07-26 MED ORDER — LACTATED RINGERS IV SOLN: INTRAVENOUS | Status: DC

## 2017-07-26 MED ORDER — DEXMEDETOMIDINE HCL 200 MCG/2ML IV SOLN
INTRAVENOUS | Status: DC | PRN
  Administered 2017-07-26 (×2): 4 ug via INTRAVENOUS

## 2017-07-26 MED ORDER — PROPOFOL 10 MG/ML IV BOLUS
INTRAVENOUS | Status: DC | PRN
  Administered 2017-07-26: 150 mg via INTRAVENOUS

## 2017-07-26 MED ORDER — OXYCODONE HCL 5 MG/5ML PO SOLN
5.0000 mg | Freq: Once | ORAL | Status: AC | PRN
  Administered 2017-07-26: 5 mg via ORAL

## 2017-07-26 MED ORDER — DEXAMETHASONE SODIUM PHOSPHATE 4 MG/ML IJ SOLN
INTRAMUSCULAR | Status: DC | PRN
  Administered 2017-07-26: 10 mg via INTRAVENOUS

## 2017-07-26 MED ORDER — SUCCINYLCHOLINE CHLORIDE 20 MG/ML IJ SOLN
INTRAMUSCULAR | Status: DC | PRN
  Administered 2017-07-26: 100 mg via INTRAVENOUS

## 2017-07-26 MED ORDER — LACTATED RINGERS IV SOLN
INTRAVENOUS | Status: DC
  Administered 2017-07-26: 10:00:00 via INTRAVENOUS

## 2017-07-26 MED ORDER — SCOPOLAMINE 1 MG/3DAYS TD PT72
1.0000 | MEDICATED_PATCH | Freq: Once | TRANSDERMAL | Status: DC
  Administered 2017-07-26: 1.5 mg via TRANSDERMAL

## 2017-07-26 MED ORDER — PHENYLEPHRINE HCL 0.5 % NA SOLN
NASAL | Status: DC | PRN
  Administered 2017-07-26: 30 mL via TOPICAL

## 2017-07-26 MED ORDER — OXYMETAZOLINE HCL 0.05 % NA SOLN
2.0000 | Freq: Once | NASAL | Status: AC
  Administered 2017-07-26: 2 via NASAL

## 2017-07-26 MED ORDER — GLYCOPYRROLATE 0.2 MG/ML IJ SOLN
INTRAMUSCULAR | Status: DC | PRN
  Administered 2017-07-26 (×2): 0.2 mg via INTRAVENOUS

## 2017-07-26 MED ORDER — MIDAZOLAM HCL 5 MG/5ML IJ SOLN
INTRAMUSCULAR | Status: DC | PRN
  Administered 2017-07-26: 2 mg via INTRAVENOUS

## 2017-07-26 SURGICAL SUPPLY — 40 items
BALLN SINUPLASTY KIT 6X16 (BALLOONS) ×2
BALLOON SINUPLASTY KIT 6X16 (BALLOONS) IMPLANT
BALLOON SINUPLASTY SYSTEM (BALLOONS) IMPLANT
BATTERY INSTRU NAVIGATION (MISCELLANEOUS) ×6 IMPLANT
BTRY SRG DRVR LF (MISCELLANEOUS) ×3
CANISTER SUCT 1200ML W/VALVE (MISCELLANEOUS) ×2 IMPLANT
CATH IV 18X1 1/4 SAFELET (CATHETERS) ×2 IMPLANT
COAGULATOR SUCT 8FR VV (MISCELLANEOUS) ×2 IMPLANT
DEVICE INFLATION SEID (MISCELLANEOUS) ×1 IMPLANT
DRAPE HEAD BAR (DRAPES) ×2 IMPLANT
ELECT REM PT RETURN 9FT ADLT (ELECTROSURGICAL) ×2
ELECTRODE REM PT RTRN 9FT ADLT (ELECTROSURGICAL) ×1 IMPLANT
GLOVE PI ULTRA LF STRL 7.5 (GLOVE) ×2 IMPLANT
GLOVE PI ULTRA NON LATEX 7.5 (GLOVE) ×3
IV CATH 18X1 1/4 SAFELET (CATHETERS) ×1
IV NS 500ML (IV SOLUTION) ×2
IV NS 500ML BAXH (IV SOLUTION) ×1 IMPLANT
KIT TURNOVER KIT A (KITS) ×2 IMPLANT
NDL ANESTHESIA 27G X 3.5 (NEEDLE) ×1 IMPLANT
NDL SPNL 25GX3.5 QUINCKE BL (NEEDLE) ×1 IMPLANT
NEEDLE ANESTHESIA  27G X 3.5 (NEEDLE) ×1
NEEDLE ANESTHESIA 27G X 3.5 (NEEDLE) ×1 IMPLANT
NEEDLE SPNL 25GX3.5 QUINCKE BL (NEEDLE) ×2 IMPLANT
NS IRRIG 500ML POUR BTL (IV SOLUTION) ×2 IMPLANT
PACK DRAPE NASAL/ENT (PACKS) ×2 IMPLANT
PATTIES SURGICAL .5 X3 (DISPOSABLE) ×2 IMPLANT
SHAVER DIEGO BLD STD TYPE A (BLADE) IMPLANT
SOL ANTI-FOG 6CC FOG-OUT (MISCELLANEOUS) ×1 IMPLANT
SOL FOG-OUT ANTI-FOG 6CC (MISCELLANEOUS) ×1
STRAP BODY AND KNEE 60X3 (MISCELLANEOUS) ×2 IMPLANT
SUT CHROMIC 3-0 (SUTURE) ×2
SUT CHROMIC 3-0 KS 27XMFL CR (SUTURE) ×1
SUT ETHILON 3-0 KS 30 BLK (SUTURE) ×1 IMPLANT
SUT PLAIN GUT 4-0 (SUTURE) ×1 IMPLANT
SUTURE CHRMC 3-0 KS 27XMFL CR (SUTURE) IMPLANT
SYR 3ML LL SCALE MARK (SYRINGE) ×2 IMPLANT
TOWEL OR 17X26 4PK STRL BLUE (TOWEL DISPOSABLE) ×2 IMPLANT
TRACKER CRANIALMASK (MASK) ×2 IMPLANT
TUBING DECLOG MULTIDEBRIDER (TUBING) IMPLANT
WATER STERILE IRR 250ML POUR (IV SOLUTION) ×2 IMPLANT

## 2017-07-26 NOTE — Op Note (Signed)
07/26/2017  5:58 PM  [id number]   Pre-Op Dx: Chronic bilateral frontal sinusitis, chronic bilateral ethmoid sinusitis, chronic bilateral maxillary sinusitis, deviated Nasal Septum, Hypertrophic Inferior Turbinates, concha bullosa left middle turbinate  Post-op Dx: Same  Proc: Bilateral endoscopic total ethmoidectomies with frontal sinusotomies, bilateral maxillary antrostomies with removal of contents, nasal Septoplasty, Bilateral Partial Reduction Inferior Turbinates, use of image guided system2  Surg:  Beverly Sessions Walda Hertzog  Anes:  GOT  EBL: 100 mL  Comp: None  Findings: Deviated septum to the right side with the spur on the left inferiorly.  Chronic inflammation of sinuses.  Evidence of mucous in the maxillary sinus on both sides and some in the ethmoids.  Very thick mucus coming from the left frontal sinus which was very large and partially blocked by a type IV frontal cell.    Procedure: With the patient in a comfortable supine position,  general orotracheal anesthesia was induced without difficulty.     The patient received preoperative Afrin spray for topical decongestion and vasoconstriction.  Intravenous prophylactic antibiotics were administered.  At an appropriate level, the patient was placed in a semi-sitting position.  Nasal vibrissae were trimmed.   1% Xylocaine with 1:100,000 epinephrine, 6 cc's, was infiltrated into the anterior floor of the nose, into the nasal spine region, into the membranous columella, and finally into the submucoperichondrial plane of the septum on both sides.  Several minutes were allowed for this to take effect.  Cottoniod pledgetts soaked in Afrin and 4% Xylocaine were placed into both nasal cavities and left while the patient was prepped and draped in the standard fashion.  The image guided system was brought in and the CT scan was downloaded from the disc.  The template was applied to the face and the template was then registered in the system.  There  is 0.5 mm of variance.  The suction instruments were then registered the system and there was good alignment between the suction instruments and the system.  The materials were removed from the nose and observed to be intact and correct in number.  The nose was inspected with a headlight and the 0 degree scope with the findings as described above.  A left Killian incision was sharply executed and carried down to the quadrangular cartilage. The mucoperichondrium was elelvated along the quadrangular plate back to the bony-cartilaginous junction. The mucoperiostium was then elevated along the ethmoid plate and the vomer. The boney-catilaginous junction was then split with a freer elevator and the mucoperiosteum was elevated on the opposite side. The mucoperiosteum was then elevated along the maxillary crest as needed to expose the crooked bone of the crest.  Boney spurs of the vomer and maxillary crest were removed with Lenoria Chime forceps.  The cartilaginous plate was trimmed along its posterior and inferior borders of about 2 mm of cartilage to free it up inferiorly. Some of the deviated ethmoid plate was then fractured and removed with Takahashi forceps to free up the posterior border of the quadrangular plate and allow it to swing back to the midline. The mucosal flaps were placed back into their anatomic position to allow visualization of the airways. The septum now sat in the midline with an improved airway.  A 3-0 Chromic suture on a Keith needle in used to anchor the inferior septum at the nasal spine with a through and through suture. The mucosal flaps are then sutured together using a through and through whip stitch of 4-0 Plain Gut with a mini-Keith needle.  This was used to close the MidvaleKillian incision and the small tear of mucosa on the right side  as well.  The 0 degree scope was then used to visualize the left nasal airway.  There was a large left middle turbinate with concha bullosa.  This was  incised along its midportion inferiorly and the entire left half of the concha bullosa was removed.  There is some thickened mucous membranes inside this.  This opened up the middle meatus more for visualization here.  The side biter was used for incising the uncinate process and this was removed using a side biter, through biting forceps, and the [facility] microdebrider.  The image guided system was used for depth the dissection and for evaluating landmarks.  The maxillary antrum was widened from the natural ostium posteriorly and inferiorly.  This was done with the [facility] microdebrider.  There was very thick mucus inside the sinus and this was all suctioned and cleaned out.  There is thickened mucous membranes along superior border and these were debrided.  The posterior and middle ethmoid air cells were opened through the ethmoid bulla and were opened all the way to the skull base.  The image guided system was used to evaluate the depth and make sure that all the air cells were opened.  Shards of bone were trimmed to smooth out the sinuses superiorly.  The 30 degree scope was used at the maxillary sinus previously along with a 70 degree scope, but now the 30 degree scope was used to visualize the middle and anterior ethmoid air cells better.  These were opened and used during the microdebrider they were cleared.  The anterior ethmoid air cells were opened.  A piece of overhanging bone was trimmed anteriorly to provide better opening into the anterior ethmoid air cells and frontal sinus duct.  There is a lateral anterior ethmoid air cell that was opened up with lots of inflamed mucous membranes.  As I opened this and near the frontal sinus duct there was thick mucus that was suctioned from these areas.  Mucous membranes were quite inflamed here.  I used the balloon sinus system to cannulate the frontal sinus duct that was medial to the anterior ethmoid air cell.  This went above a type IV frontal cell and up into the  upper normal sinus.  I placed the balloon up as far as it would go to help crush the type IV frontal sinus cell and open up the pathway more.  I then used the balloon to open up the frontal sinus duct further and collapsed some of the wall of the anterior ethmoid cell.  The bone fragments were removed with through biting frontal sinus instruments.  This left a good opening up into the frontal sinus duct, and the rest of the ethmoids and maxillary sinus were open and clear.  Cottonoid pledget was placed here temporarily.  The right side was addressed with a 0 degree scope and the middle turbinate was thin and lateralized because of the septal deviation previously.  The middle turbinate was medialized to visualize the middle meatus.  The 0 degree scope was used for visualizing this and trimming the uncinate process.  This was done by side biters through biting 45 degree forceps and then the [facility] microdebrider.  The maxillary antrostomy was done starting from the natural ostium posteriorly and inferiorly using the microdebrider.  The maxillary sinus was again filled with some thick mucus that was suctioned clear and some of the  inflamed tissues near the superior opening of the sinus were all trimmed using the microdebrider to clean out the sinus.  The 0 degree scope was used for visualizing the middle and posterior ethmoid air cells.  The ethmoid bulla was opened and posterior ethmoid air cells were opened up down to the fovea ethmoidalis.  These were cleaned out of all bone chips to open his area well.  30 degree scope was then used to carry the dissection anteriorly into the middle and anterior ethmoid air cells.  The frontal sinus duct was noted medially and this side with a middle frontal air cell and then a lateral frontal air cell.  These were opened and the party wall between them was cut with frontal sinus through biting forceps to help open up this wider for better drainage.  The area was revisualized and  the frontal sinuses were now widely open along with the ethmoid sinuses and the maxillary sinus.  Cottonoid pledget was placed here temporarily.  The left side was revisualized and there is no further hidden air cells they were all opened up using the image guided system to evaluate all the area.  The mucous membranes had been trimmed.  The ethmoid sinus and middle meatus were filled with Nex pack X packing that is dissolvable and water-soluble.  This was used because she is allergic to shellfish so I could not use the xerogel I normally place.  The right side was visualized and again there is no further bleeding or any further disease noted.  All the sinuses were open and the packing was placed into the ethmoid sinus at the anterior opening to the frontal duct as well as posteriorly.  This was holding the middle turbinate medialized as well.  The inferior turbinates were then inspected. An incision was created along the inferior aspect of the left inferior turbinate with removal of some of the inferior soft tissue and bone. Electrocautery was used to control bleeding in the area. The remaining turbinate was then outfractured to open up the airway further. There was no significant bleeding noted. The right turbinate was then trimmed and outfractured in a similar fashion.  The airways were then visualized and showed open passageways on both sides that were significantly improved compared to before surgery. There was no signifcant bleeding. Nasal splints were applied to both sides of the septum using Xomed 0.25mm regular sized splints that were trimmed, and then held in position with a 3-0 Nylon through and through suture.  The patient was turned back over to anesthesia, and awakened, extubated, and taken to the PACU in satisfactory condition.  Dispo:   PACU to home  Plan: Ice, elevation, narcotic analgesia, steroid taper, and prophylactic antibiotics for the duration of indwelling nasal foreign bodies.  We  will reevaluate the patient in the office in 6 days and remove the septal splints.  Return to work in 10 days, strenuous activities in two weeks.   Cammy Copa 07/26/2017 5:58 PM

## 2017-07-26 NOTE — Transfer of Care (Signed)
Immediate Anesthesia Transfer of Care Note  Patient: Paula [name]  Procedure(s) Performed: IMAGE GUIDED SINUS SURGERY (Bilateral Nose) SEPTOPLASTY WITH TOTAL  ETHMOIDECTOMY, AND MAXILLARY ANTROSTOMYWITH REMOVAL OF TISSUE, (Bilateral Nose) ENDOSCOPIC INFERIOR  TURBINATE REDUCTION (Bilateral Nose) FRONTAL SINUS EXPLORATION (Bilateral Nose)  Patient Location: PACU  Anesthesia Type: General  Level of Consciousness: awake, alert  and patient cooperative  Airway and Oxygen Therapy: Patient Spontanous Breathing and Patient connected to supplemental oxygen  Post-op Assessment: Post-op Vital signs reviewed, Patient's Cardiovascular Status Stable, Respiratory Function Stable, Patent Airway and No signs of Nausea or vomiting  Post-op Vital Signs: Reviewed and stable  Complications: No apparent anesthesia complications

## 2017-07-26 NOTE — Anesthesia Preprocedure Evaluation (Signed)
Anesthesia Evaluation  Patient identified by MRN, date of birth, ID band Patient awake    Reviewed: Allergy & Precautions, NPO status , Patient's Chart, lab work & pertinent test results  History of Anesthesia Complications (+) PONV and history of anesthetic complications  Airway Mallampati: I  TM Distance: >3 FB Neck ROM: Full    Dental  (+)    Pulmonary asthma ,    Pulmonary exam normal breath sounds clear to auscultation       Cardiovascular Exercise Tolerance: Good negative cardio ROS Normal cardiovascular exam Rhythm:Regular Rate:Normal     Neuro/Psych  Headaches,    GI/Hepatic GERD  ,  Endo/Other  Hypothyroidism   Renal/GU negative Renal ROS     Musculoskeletal   Abdominal   Peds  Hematology  (+) Blood dyscrasia, anemia ,   Anesthesia Other Findings   Reproductive/Obstetrics                             Anesthesia Physical Anesthesia Plan  ASA: II  Anesthesia Plan: General   Post-op Pain Management:    Induction: Intravenous  PONV Risk Score and Plan: 3 and Scopolamine patch - Pre-op, Dexamethasone and Ondansetron  Airway Management Planned: Oral ETT  Additional Equipment:   Intra-op Plan:   Post-operative Plan: Extubation in OR  Informed Consent: I have reviewed the patients History and Physical, chart, labs and discussed the procedure including the risks, benefits and alternatives for the proposed anesthesia with the patient or authorized representative who has indicated his/her understanding and acceptance.     Plan Discussed with: CRNA  Anesthesia Plan Comments:         Anesthesia Quick Evaluation

## 2017-07-26 NOTE — Anesthesia Procedure Notes (Signed)
Procedure Name: Intubation Date/Time: 07/26/2017 12:23 PM Performed by: Janna Arch, CRNA Pre-anesthesia Checklist: Patient identified, Emergency Drugs available, Suction available and Patient being monitored Patient Re-evaluated:Patient Re-evaluated prior to induction Oxygen Delivery Method: Circle system utilized Preoxygenation: Pre-oxygenation with 100% oxygen Induction Type: IV induction Ventilation: Mask ventilation without difficulty Laryngoscope Size: Mac and 3 Grade View: Grade I Tube type: Oral Rae Tube size: 7.5 mm Number of attempts: 1 Airway Equipment and Method: Stylet Placement Confirmation: ETT inserted through vocal cords under direct vision and breath sounds checked- equal and bilateral Secured at: 21 cm Tube secured with: Tape

## 2017-07-26 NOTE — H&P (Signed)
H&P has been reviewedand patient reevaluated,  and no changes necessary. To be downloaded later.  

## 2017-07-26 NOTE — Anesthesia Postprocedure Evaluation (Signed)
Anesthesia Post Note  Patient: Paula [name]  Procedure(s) Performed: IMAGE GUIDED SINUS SURGERY (Bilateral Nose) SEPTOPLASTY WITH TOTAL  ETHMOIDECTOMY, AND MAXILLARY ANTROSTOMYWITH REMOVAL OF TISSUE, (Bilateral Nose) ENDOSCOPIC INFERIOR  TURBINATE REDUCTION (Bilateral Nose) FRONTAL SINUS EXPLORATION (Bilateral Nose)  Patient location during evaluation: PACU Anesthesia Type: General Level of consciousness: awake and alert, oriented and patient cooperative Pain management: pain level controlled Vital Signs Assessment: post-procedure vital signs reviewed and stable Respiratory status: spontaneous breathing, nonlabored ventilation and respiratory function stable Cardiovascular status: blood pressure returned to baseline and stable Postop Assessment: adequate PO intake Anesthetic complications: no    Reed BreechAndrea Zasha Belleau

## 2017-07-30 LAB — SURGICAL PATHOLOGY

## 2017-09-06 ENCOUNTER — Other Ambulatory Visit: Payer: Self-pay | Admitting: *Deleted

## 2017-09-06 ENCOUNTER — Encounter: Payer: Self-pay | Admitting: Obstetrics and Gynecology

## 2017-09-06 MED ORDER — FLUOXETINE HCL 20 MG PO CAPS: 20.0000 mg | ORAL_CAPSULE | Freq: Two times a day (BID) | ORAL | 3 refills | Status: DC

## 2017-11-08 ENCOUNTER — Other Ambulatory Visit: Payer: Self-pay | Admitting: Obstetrics and Gynecology

## 2017-12-04 ENCOUNTER — Inpatient Hospital Stay
Admit: 2017-12-04 | Discharge: 2017-12-04 | Disposition: A | Discharge status: Home / Self Care | Payer: PRIVATE HEALTH INSURANCE | Attending: Emergency Medicine

## 2017-12-04 DIAGNOSIS — K529 Noninfective gastroenteritis and colitis, unspecified: Secondary | ICD-10-CM

## 2017-12-04 LAB — CBC WITH AUTO DIFFERENTIAL
Absolute Eos #: 0 10*3/uL (ref 0.0–0.4)
Absolute Lymph #: 0.8 10*3/uL — ABNORMAL LOW (ref 1.0–4.8)
Absolute Mono #: 0.8 10*3/uL (ref 0.1–1.2)
Basophils Absolute: 0 10*3/uL (ref 0.0–0.2)
Basophils: 0 % (ref 0–2)
Eosinophils %: 0 % — ABNORMAL LOW (ref 1–4)
Hematocrit: 43 % (ref 36–46)
Hemoglobin: 14 g/dL (ref 12.0–16.0)
Lymphocytes: 10 % — ABNORMAL LOW (ref 24–44)
MCH: 28.4 pg (ref 26–34)
MCHC: 32.6 g/dL (ref 31–37)
MCV: 87.1 fL (ref 80–100)
MPV: 8.4 fL (ref 6.0–12.0)
Monocytes: 9 % (ref 2–11)
Platelets: 262 10*3/uL (ref 140–450)
RBC: 4.94 m/uL (ref 4.0–5.2)
RDW: 14.4 % (ref 12.5–15.4)
Seg Neutrophils: 81 % — ABNORMAL HIGH (ref 36–66)
Segs Absolute: 6.6 10*3/uL (ref 1.8–7.7)
WBC: 8.2 10*3/uL (ref 3.5–11.0)

## 2017-12-04 LAB — MICROSCOPIC URINALYSIS
Epithelial Cells UA: 10 /HPF (ref 0–5)
WBC, UA: 2 /HPF (ref 0–5)

## 2017-12-04 LAB — COMPREHENSIVE METABOLIC PANEL W/ REFLEX TO MG FOR LOW K
ALT: 10 U/L (ref 5–33)
AST: 14 U/L (ref <32)
Albumin/Globulin Ratio: 1 (ref 1.0–2.5)
Albumin: 4.1 g/dL (ref 3.5–5.2)
Alkaline Phosphatase: 66 U/L (ref 35–104)
Anion Gap: 14 mmol/L (ref 9–17)
BUN: 7 mg/dL (ref 6–20)
CO2: 21 mmol/L (ref 20–31)
Calcium: 9.3 mg/dL (ref 8.6–10.4)
Chloride: 100 mmol/L (ref 98–107)
Creatinine: 0.9 mg/dL (ref 0.50–0.90)
GFR African American: >60 mL/min (ref >60)
GFR Non-African American: >60 mL/min (ref >60)
Glucose: 113 mg/dL — ABNORMAL HIGH (ref 70–99)
Potassium: 3.9 mmol/L (ref 3.7–5.3)
Sodium: 135 mmol/L (ref 135–144)
Total Bilirubin: 0.2 mg/dL — ABNORMAL LOW (ref 0.3–1.2)
Total Protein: 8.3 g/dL (ref 6.4–8.3)

## 2017-12-04 LAB — URINALYSIS WITH REFLEX TO CULTURE
Bilirubin Urine: NEGATIVE — AB
Glucose, Ur: NEGATIVE
Leukocyte Esterase, Urine: NEGATIVE
Nitrite, Urine: NEGATIVE
Specific Gravity, UA: 1.025 (ref 1.005–1.030)
Urobilinogen, Urine: NORMAL
pH, UA: 6 (ref 5.0–8.0)

## 2017-12-04 LAB — BLOOD OCCULT STOOL SCREEN #1
Date, Stool #1: 7092019
Occult Blood, Stool #1: POSITIVE — AB
Time, Stool #1: 1030

## 2017-12-04 LAB — LIPASE: Lipase: 15 U/L (ref 13–60)

## 2017-12-04 LAB — HCG, SERUM, QUALITATIVE: hCG Qual: NEGATIVE

## 2017-12-04 MED ORDER — IBUPROFEN 600 MG PO TABS
600 MG | Freq: Once | ORAL | Status: AC
Start: 2017-12-04 — End: 2017-12-04
  Administered 2017-12-04: 15:00:00 600 mg via ORAL

## 2017-12-04 MED ORDER — LIDOCAINE VISCOUS HCL 2 % MT SOLN
2 % | Freq: Once | OROMUCOSAL | Status: AC
Start: 2017-12-04 — End: 2017-12-04
  Administered 2017-12-04: 15:00:00 15 mL via OROMUCOSAL

## 2017-12-04 MED ORDER — OMEPRAZOLE 20 MG PO CPDR
20 MG | ORAL_CAPSULE | Freq: Every day | ORAL | 0 refills | Status: DC
Start: 2017-12-04 — End: 2018-11-15

## 2017-12-04 MED ORDER — ONDANSETRON 4 MG PO TBDP
4 MG | ORAL_TABLET | Freq: Three times a day (TID) | ORAL | 0 refills | Status: DC | PRN
Start: 2017-12-04 — End: 2018-11-15

## 2017-12-04 MED ORDER — ALUM & MAG HYDROXIDE-SIMETH 200-200-20 MG/5ML PO SUSP
200-200-20 MG/5ML | Freq: Once | ORAL | Status: AC
Start: 2017-12-04 — End: 2017-12-04
  Administered 2017-12-04: 15:00:00 15 mL via ORAL

## 2017-12-04 MED ORDER — ONDANSETRON HCL 4 MG/2ML IJ SOLN
4 MG/2ML | Freq: Once | INTRAMUSCULAR | Status: AC
Start: 2017-12-04 — End: 2017-12-04
  Administered 2017-12-04: 14:00:00 4 mg via INTRAVENOUS

## 2017-12-04 MED ORDER — KETOROLAC TROMETHAMINE 30 MG/ML IJ SOLN
30 MG/ML | Freq: Once | INTRAMUSCULAR | Status: DC
Start: 2017-12-04 — End: 2017-12-04

## 2017-12-04 MED ORDER — SODIUM CHLORIDE 0.9 % IV BOLUS
0.9 % | Freq: Once | INTRAVENOUS | Status: AC
Start: 2017-12-04 — End: 2017-12-04
  Administered 2017-12-04 (×2): 1000 mL via INTRAVENOUS

## 2017-12-04 MED FILL — MAG-AL PLUS 200-200-20 MG/5ML PO LIQD: 200-200-20 MG/5ML | ORAL | Qty: 30

## 2017-12-04 MED FILL — LIDOCAINE VISCOUS HCL 2 % MT SOLN: 2 % | OROMUCOSAL | Qty: 15

## 2017-12-04 MED FILL — ONDANSETRON HCL 4 MG/2ML IJ SOLN: 4 MG/2ML | INTRAMUSCULAR | Qty: 2

## 2017-12-04 MED FILL — KETOROLAC TROMETHAMINE 30 MG/ML IJ SOLN: 30 MG/ML | INTRAMUSCULAR | Qty: 1

## 2017-12-04 MED FILL — IBUPROFEN 600 MG PO TABS: 600 MG | ORAL | Qty: 1

## 2017-12-04 NOTE — ED Provider Notes (Signed)
[facility] ED  [address]  [place] [address]  Phone: [phone]      Pt Name: Olivia Castro  WGN:[id number]  Birthdate August 11, 1980  Date of evaluation: 12/04/2017      CHIEF COMPLAINT       Chief Complaint   Patient presents with   ??? Headache     since sunday but feels better today   ??? Emesis     once yesterday   ??? Diarrhea     since sunday       HISTORY OF PRESENT ILLNESS   37 year old female presents to the emergency department today complaining of frontal cephalgia with associated diarrhea that started this past Sunday.  She describes the diarrhea as watery and yellow.  It is now started to turn brown.  She denies any blood.  She does report intermittent headache which she describes as severe but it completely gets aborted with ibuprofen.  No fevers.  Her temperature max has been 99.9 ??F.  She does report some intermittent nausea as well as epigastric pain.  Pain does not radiate anywhere.  She is vomited one time.  No mitigating precipitating exacerbating factors.  No known sick contacts.  There is been no other contemporaneous evaluation or management of the symptoms prior to arrival.  No foreign food or recent antibiotic use.  No travel.    REVIEW OF SYSTEMS     Review of Systems   All other systems reviewed and are negative.        PAST MEDICAL HISTORY    has no past medical history on file.    SURGICAL HISTORY      has a past surgical history that includes Ectopic pregnancy surgery.    CURRENT MEDICATIONS       Previous Medications    No medications on file       ALLERGIES     has No Known Allergies.    FAMILY HISTORY     indicated that her mother is alive. She indicated that her father is alive. She indicated that her maternal grandmother is deceased.     family history includes Diabetes in her father and mother; High Blood Pressure in her father and mother; Hypertension in her father and mother; Lung Cancer in her maternal grandmother; Prostate Cancer in her father.    SOCIAL HISTORY       reports that she has never smoked. She has never used smokeless tobacco. She reports that she does not drink alcohol or use drugs.    PHYSICAL EXAM     INITIAL VITALS:  height is 5\' 4"  (1.626 m) and weight is 79.4 kg (175 lb). Her oral temperature is 98.8 ??F (37.1 ??C). Her blood pressure is 126/71 and her pulse is 81. Her respiration is 16 and oxygen saturation is 96%.   Physical Exam   Constitutional: She is oriented to person, place, and time. She appears well-developed and well-nourished. No distress.   HENT:   Head: Normocephalic and atraumatic.   Mouth/Throat: Oropharynx is clear and moist.   Eyes: Pupils are equal, round, and reactive to light. Conjunctivae and EOM are normal.   Neck: Normal range of motion. Neck supple. No tracheal deviation present.   Cardiovascular: Normal rate, regular rhythm and intact distal pulses.   Pulmonary/Chest: Effort normal and breath sounds normal. No respiratory distress.   Abdominal: Soft. Bowel sounds are normal. She exhibits no distension. There is tenderness.   Patient is mildly tender in the epigastric  area without any surgical findings.   Musculoskeletal: Normal range of motion. She exhibits no edema or tenderness.   Neurological: She is alert and oriented to person, place, and time.   Skin: Skin is warm and dry.   Psychiatric: She has a normal mood and affect. Her behavior is normal. Judgment and thought content normal.   Vitals reviewed.        DIFFERENTIAL DIAGNOSIS/ MDM:   Patient appears dehydrated to me.  I will hydrate with IV fluids.  I will treat with medications outlined below.  I will do serial evaluations.  If she can provide some diarrhea here for [place] today, I will have the lab interrogated.    DIAGNOSTIC RESULTS     EKG:  None    RADIOLOGY:   No results found.      LABS:  Results for orders placed or performed during the hospital encounter of 12/04/17   CBC Auto Differential   Result Value Ref Range    WBC 8.2 3.5 - 11.0 k/uL    RBC 4.94 4.0 - 5.2 m/uL     Hemoglobin 14.0 12.0 - 16.0 g/dL    Hematocrit 16.1 36 - 46 %    MCV 87.1 80 - 100 fL    MCH 28.4 26 - 34 pg    MCHC 32.6 31 - 37 g/dL    RDW 09.6 04.5 - 40.9 %    Platelets 262 140 - 450 k/uL    MPV 8.4 6.0 - 12.0 fL    NRBC Automated NOT REPORTED per 100 WBC    Differential Type NOT REPORTED     Seg Neutrophils 81 (H) 36 - 66 %    Lymphocytes 10 (L) 24 - 44 %    Monocytes 9 2 - 11 %    Eosinophils % 0 (L) 1 - 4 %    Basophils 0 0 - 2 %    Immature Granulocytes NOT REPORTED 0 %    Segs Absolute 6.60 1.8 - 7.7 k/uL    Absolute Lymph # 0.80 (L) 1.0 - 4.8 k/uL    Absolute Mono # 0.80 0.1 - 1.2 k/uL    Absolute Eos # 0.00 0.0 - 0.4 k/uL    Basophils # 0.00 0.0 - 0.2 k/uL    Absolute Immature Granulocyte NOT REPORTED 0.00 - 0.30 k/uL    WBC Morphology NOT REPORTED     RBC Morphology NOT REPORTED     Platelet Estimate NOT REPORTED    Lipase   Result Value Ref Range    Lipase 15 13 - 60 U/L   Comprehensive Metabolic Panel w/ Reflex to MG   Result Value Ref Range    Glucose 113 (H) 70 - 99 mg/dL    BUN 7 6 - 20 mg/dL    CREATININE 8.11 9.14 - 0.90 mg/dL    Bun/Cre Ratio NOT REPORTED 9 - 20    Calcium 9.3 8.6 - 10.4 mg/dL    Sodium 782 956 - 213 mmol/L    Potassium 3.9 3.7 - 5.3 mmol/L    Chloride 100 98 - 107 mmol/L    CO2 21 20 - 31 mmol/L    Anion Gap 14 9 - 17 mmol/L    Alkaline Phosphatase 66 35 - 104 U/L    ALT 10 5 - 33 U/L    AST 14 <32 U/L    Total Bilirubin 0.20 (L) 0.3 - 1.2 mg/dL    Total Protein 8.3 6.4 - 8.3 g/dL  Alb 4.1 3.5 - 5.2 g/dL    Albumin/Globulin Ratio 1.0 1.0 - 2.5    GFR Non-African American >60 >60 mL/min    GFR African American >60 >60 mL/min    GFR Comment          GFR Staging NOT REPORTED    HCG Qualitative, Serum   Result Value Ref Range    hCG Qual NEGATIVE NEGATIVE   Urinalysis Reflex to Culture   Result Value Ref Range    Color, UA YELLOW YELLOW    Turbidity UA CLEAR CLEAR    Glucose, Ur NEGATIVE NEGATIVE    Bilirubin Urine NEGATIVE  Verified by ictotest. (A) NEGATIVE    Ketones, Urine  SMALL (A) NEGATIVE    Specific Gravity, UA 1.025 1.005 - 1.030    Urine Hgb TRACE (A) NEGATIVE    pH, UA 6.0 5.0 - 8.0    Protein, UA 2+ (A) NEGATIVE    Urobilinogen, Urine Normal Normal    Nitrite, Urine NEGATIVE NEGATIVE    Leukocyte Esterase, Urine NEGATIVE NEGATIVE    Urinalysis Comments NOT REPORTED    Blood Occult Stool Screen #1   Result Value Ref Range    Occult Blood, Stool #1 POSITIVE (A) NEGATIVE    Date, Stool #1 2,440,1027,092,019     Time, Stool #1 1,030     Occult Blood, Stool #2 NOT REPORTED NEGATIVE    Date, Stool #2 NOT REPORTED     Time, Stool #2 NOT REPORTED     Occult Blood, Stool #3 NOT REPORTED NEGATIVE    Date, Stool #3 NOT REPORTED     Time, Stool #3 NOT REPORTED    Microscopic Urinalysis   Result Value Ref Range    -          WBC, UA 2 TO 5 0 - 5 /HPF    RBC, UA None 0 - 2 /HPF    Casts UA NOT REPORTED 0 - 2 /LPF    Crystals UA NOT REPORTED None /HPF    Epithelial Cells UA 10 TO 20 0 - 5 /HPF    Renal Epithelial, Urine NOT REPORTED 0 /HPF    Bacteria, UA None None    Mucus, UA NOT REPORTED None    Trichomonas, UA NOT REPORTED None    Amorphous, UA NOT REPORTED None    Other Observations UA (A) NOT REQ.     Utilizing a urinalysis as the only screening method to exclude a potential uropathogen can be unreliable in many patient populations.  Rapid screening tests are less sensitive than culture and if UTI is a clinical possibility, culture should be considered despite a negative urinalysis.    Yeast, UA NOT REPORTED None       EMERGENCY DEPARTMENT COURSE:     Thepatient was given the following medications:  Orders Placed This Encounter   Medications   ??? 0.9 % sodium chloride bolus   ??? DISCONTD: ketorolac (TORADOL) injection 30 mg   ??? ondansetron (ZOFRAN) injection 4 mg   ??? ibuprofen (ADVIL;MOTRIN) tablet 600 mg   ??? aluminum & magnesium hydroxide-simethicone (MAALOX) 200-200-20 MG/5ML suspension 15 mL   ??? lidocaine viscous hcl (XYLOCAINE) 2 % solution 15 mL   ??? omeprazole (PRILOSEC) 20 MG delayed  release capsule     Sig: Take 1 capsule by mouth daily     Dispense:  30 capsule     Refill:  0   ??? ondansetron (ZOFRAN ODT) 4 MG disintegrating tablet  Sig: Take 1 tablet by mouth every 8 hours as needed for Nausea     Dispense:  12 tablet     Refill:  0        Vitals:    Vitals:    12/04/17 0949   BP: 126/71   Pulse: 81   Resp: 16   Temp: 98.8 ??F (37.1 ??C)   TempSrc: Oral   SpO2: 96%   Weight: 79.4 kg (175 lb)   Height: 5\' 4"  (1.626 m)     -------------------------  BP: 126/71, Temp: 98.8 ??F (37.1 ??C), Pulse: 81, Resp: 16      Re-evaluation Notes  Patient reports that she feels a lot better on reevaluation.  The GI cocktail alleviated her epigastric pain.  Headache is gone.  Nausea has resolved.  She is tolerating oral fluids without vomiting.  She has been given supportive care instructions and will be discharged.  Stool will be cultured.    CONSULTS:  None    CRITICAL CARE:   None    PROCEDURES:  None    FINAL IMPRESSION      1. Gastroenteritis          DISPOSITION/PLAN   DISPOSITION Decision To Discharge 12/04/2017 11:43:10 AM      Condition on Disposition  Good    PATIENT REFERRED TO:  Your personal physician    Schedule an appointment as soon as possible for a visit in 2 days        DISCHARGE MEDICATIONS:  @[email or web address] @    (Please note that portions of this note were completed with a voice recognitionprogram.  Efforts were made to edit the dictations but occasionally words are mis-transcribed.)    Urbano Heir MD, F.A.C.E.P, F.A.A.E.M  Emergency Physician Attending          Urbano Heir, MD  12/04/17 1144

## 2017-12-04 NOTE — ED Triage Notes (Signed)
Pt presents to ED with complaint of headache since Sunday, diarrhea since Sunday and vomiting yesterday.  Pt states she has had a low grade temp since Sunday.  She has intermittent abd cramping that is better after having diarrhea.  She has been taking tylenol, motrin, pepto bismo without relief.  She has not had anything for pain or diarrhea today.  Her headache is better today.  Pt states she feels dehydrated.

## 2017-12-05 LAB — GIARDIA / CRYPTOSPORIDUM ANTIGENS
Direct Exam: NEGATIVE
Direct Exam: NEGATIVE

## 2017-12-05 LAB — C. DIFFICILE TOXIN MOLECULAR

## 2017-12-08 LAB — CULTURE, STOOL
Campylobacter PCR: NEGATIVE
Plesiomonas Shigelloides PCR: NEGATIVE
Salmonella PCR: POSITIVE — AB
Shigatoxin Gene PCR: NEGATIVE
Shigella Sp PCR: NEGATIVE
Yersinia Enterocolitica PCR: NEGATIVE

## 2017-12-08 LAB — ORGANISM ID W/ SENSI

## 2018-01-15 ENCOUNTER — Other Ambulatory Visit: Payer: Self-pay | Admitting: Obstetrics and Gynecology

## 2018-02-18 ENCOUNTER — Telehealth: Payer: No Typology Code available for payment source | Admitting: Family

## 2018-02-18 DIAGNOSIS — J329 Chronic sinusitis, unspecified: Secondary | ICD-10-CM | POA: Diagnosis not present

## 2018-02-18 MED ORDER — AMOXICILLIN-POT CLAVULANATE 875-125 MG PO TABS: 1.0000 | ORAL_TABLET | Freq: Two times a day (BID) | ORAL | 0 refills | Status: DC

## 2018-02-18 NOTE — Progress Notes (Signed)

## 2018-02-20 ENCOUNTER — Other Ambulatory Visit: Payer: Self-pay | Admitting: Obstetrics and Gynecology

## 2018-02-20 MED ORDER — FLUOXETINE HCL 10 MG PO CAPS: 10.0000 mg | ORAL_CAPSULE | Freq: Every day | ORAL | 6 refills | Status: DC

## 2018-03-21 ENCOUNTER — Encounter: Payer: Self-pay | Admitting: Obstetrics and Gynecology

## 2018-03-21 ENCOUNTER — Ambulatory Visit (INDEPENDENT_AMBULATORY_CARE_PROVIDER_SITE_OTHER): Payer: No Typology Code available for payment source | Admitting: Obstetrics and Gynecology

## 2018-03-21 VITALS — BP 120/84 | HR 85 | Ht 59.0 in | Wt 169.6 lb

## 2018-03-21 DIAGNOSIS — E669 Obesity, unspecified: Secondary | ICD-10-CM

## 2018-03-21 DIAGNOSIS — E039 Hypothyroidism, unspecified: Secondary | ICD-10-CM

## 2018-03-21 DIAGNOSIS — Z01419 Encounter for gynecological examination (general) (routine) without abnormal findings: Secondary | ICD-10-CM | POA: Diagnosis not present

## 2018-03-21 NOTE — Progress Notes (Signed)
Subjective:   Paula [name] is a 37 y.o. G7P2002 Caucasian female here for a routine well-woman exam.  Patient's last menstrual period was 02/22/2018.    Current complaints: concerned about weight gain, elevated cholesterol when panel drawn in April 2019  Feels less anxiety since restarting the prozac,  PCP: [facility]       does desire labs  Social History: Sexual: heterosexual Marital Status: married Living situation: with family Occupation: Charity fundraiser at [employer] Tobacco/alcohol: no tobacco use Illicit drugs: no history of illicit drug use  The following portions of the patient's history were reviewed and updated as appropriate: allergies, current medications, past family history, past medical history, past social history, past surgical history and problem list.  Past Medical History Past Medical History:  Diagnosis Date  . Anemia   . Asthma    exercise induced  . BULIMIA 01/17/2008   Annotation: AGE 16 Qualifier: Diagnosis of  By: Lorrene Reid LPN, Burna Mortimer    . Complication of anesthesia    vomited during last c/s  . 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 Cystic fibrosis carrier in second trimester, antepartum 2016  . Depression   . Eczema   . GERD (gastroesophageal reflux disease)    chronic  . Headache    h/o migraines  . Hypothyroidism   . Nausea   . Oligomenorrhea 12/22/2014  . PONV (postoperative nausea and vomiting)   . Rhinitis, allergic     Past Surgical History Past Surgical History:  Procedure Laterality Date  . CESAREAN SECTION  04/19/2013   LTCS; CPD  . CESAREAN SECTION N/A 10/27/2015   Procedure: REPEAT CESAREAN SECTION;  Surgeon: Herold Harms, MD;  Location: [facility] ORS;  Service: Obstetrics;  Laterality: N/A;  . ENDOSCOPIC TURBINATE REDUCTION Bilateral 07/26/2017   Procedure: ENDOSCOPIC INFERIOR  TURBINATE REDUCTION;  Surgeon: Vernie Murders, MD;  Location: [facility];  Service: ENT;  Laterality: Bilateral;  . FRONTAL SINUS EXPLORATION  Bilateral 07/26/2017   Procedure: FRONTAL SINUS EXPLORATION;  Surgeon: Vernie Murders, MD;  Location: [facility];  Service: ENT;  Laterality: Bilateral;  . IMAGE GUIDED SINUS SURGERY Bilateral 07/26/2017   Procedure: IMAGE GUIDED SINUS SURGERY;  Surgeon: Vernie Murders, MD;  Location: [facility];  Service: ENT;  Laterality: Bilateral;  GAVE DISK TO CECE 2-12  . SEPTOPLASTY WITH ETHMOIDECTOMY, AND MAXILLARY ANTROSTOMY Bilateral 07/26/2017   Procedure: SEPTOPLASTY WITH TOTAL  ETHMOIDECTOMY, AND MAXILLARY ANTROSTOMYWITH REMOVAL OF TISSUE,;  Surgeon: Vernie Murders, MD;  Location: [facility];  Service: ENT;  Laterality: Bilateral;  . TONSILLECTOMY      Gynecologic History [id number]  Patient's last menstrual period was 02/22/2018. Contraception: NuvaRing vaginal inserts Last Pap: 02/2017. Results were: normal   Obstetric History OB History  Gravida Para Term Preterm AB Living  2 2 2     2   SAB TAB Ectopic Multiple Live Births        0 2    # Outcome Date GA Lbr Len/2nd Weight Sex Delivery Anes PTL Lv  2 Term 10/27/15 110w1d  7 lb 11.1 oz (3.49 kg) F CS-LTranv   LIV  1 Term 04/19/13 101w5d  7 lb 4 oz (3.289 kg) F CS-LTranv   LIV    Obstetric Comments  LTCS: small statue, CPD.     Current Medications Current Outpatient Medications on File Prior to Visit  Medication Sig Dispense Refill  . albuterol (PROVENTIL HFA;VENTOLIN HFA) 108 (90 Base) MCG/ACT inhaler Inhale 2 puffs into the lungs every 6 (six) hours as needed  for wheezing or shortness of breath.    . Ascorbic Acid (VITAMIN C) 1000 MG tablet Take 1,000 mg by mouth daily.    . cetirizine (ZYRTEC) 10 MG tablet Take 10 mg by mouth daily.    Marland Kitchen etonogestrel-ethinyl estradiol (NUVARING) 0.12-0.015 MG/24HR vaginal ring Insert vaginally and leave in place for 3 consecutive weeks, then remove for 1 week. 1 each 12  . FLUoxetine (PROZAC) 10 MG capsule Take 1 capsule (10 mg total) by mouth daily. 30 capsule 6  .  levothyroxine (SYNTHROID, LEVOTHROID) 75 MCG tablet TAKE 1 TABLET BY MOUTH DAILY BEFORE BREAKFAST. 30 tablet 6  . amoxicillin-clavulanate (AUGMENTIN) 875-125 MG tablet Take 1 tablet by mouth 2 (two) times daily. (Patient not taking: Reported on 03/21/2018) 14 tablet 0  . Cholecalciferol (VITAMIN D3) 5000 units CAPS Take 1 capsule (5,000 Units total) by mouth daily. (Patient not taking: Reported on 03/21/2018) 120 capsule 2  . ibuprofen (ADVIL,MOTRIN) 800 MG tablet Take 1 tablet (800 mg total) by mouth every 8 (eight) hours. (Patient not taking: Reported on 07/19/2017) 60 tablet 1  . phenazopyridine (PYRIDIUM) 200 MG tablet Take 1 tablet (200 mg total) by mouth 3 (three) times daily as needed for pain. (Patient not taking: Reported on 03/20/2017) 6 tablet 0   No current facility-administered medications on file prior to visit.     Review of Systems Patient denies any headaches, blurred vision, shortness of breath, chest pain, abdominal pain, problems with bowel movements, urination, or intercourse.  Objective:  BP 120/84   Pulse 85   Ht 4\' 11"  (1.499 m)   Wt 169 lb 10.1 oz (76.9 kg)   LMP 02/22/2018   BMI 34.26 kg/m  Physical Exam  General:  Well developed, well nourished, no acute distress. She is alert and oriented x3. Skin:  Warm and dry Neck:  Midline trachea, no thyromegaly or nodules Cardiovascular: Regular rate and rhythm, no murmur heard Lungs:  Effort normal, all lung fields clear to auscultation bilaterally Breasts:  No dominant palpable mass, retraction, or nipple discharge Abdomen:  Soft, non tender, no hepatosplenomegaly or masses Pelvic:  External genitalia is normal in appearance.  The vagina is normal in appearance. The cervix is bulbous, no CMT.  Thin prep pap is not done . Uterus is felt to be normal size, shape, and contour.  No adnexal masses or tenderness noted.  Extremities:  No swelling or varicosities noted Psych:  She has a normal mood and affect  Assessment:    Healthy well-woman exam Anxiety Hypothyroidism Obesity   Plan:  Discussed stress relief and healthy lifestyle changes.  F/U 1 year for AE, or sooner if needed   Kalayna Noy Suzan Nailer, CNM

## 2018-03-21 NOTE — Patient Instructions (Signed)
Preventive Care 18-39 Years, Female Preventive care refers to lifestyle choices and visits with your health care provider that can promote health and wellness. What does preventive care include?  A yearly physical exam. This is also called an annual well check.  Dental exams once or twice a year.  Routine eye exams. Ask your health care provider how often you should have your eyes checked.  Personal lifestyle choices, including: ? Daily care of your teeth and gums. ? Regular physical activity. ? Eating a healthy diet. ? Avoiding tobacco and drug use. ? Limiting alcohol use. ? Practicing safe sex. ? Taking vitamin and mineral supplements as recommended by your health care provider. What happens during an annual well check? The services and screenings done by your health care provider during your annual well check will depend on your age, overall health, lifestyle risk factors, and family history of disease. Counseling Your health care provider may ask you questions about your:  Alcohol use.  Tobacco use.  Drug use.  Emotional well-being.  Home and relationship well-being.  Sexual activity.  Eating habits.  Work and work Statistician.  Method of birth control.  Menstrual cycle.  Pregnancy history.  Screening You may have the following tests or measurements:  Height, weight, and BMI.  Diabetes screening. This is done by checking your blood sugar (glucose) after you have not eaten for a while (fasting).  Blood pressure.  Lipid and cholesterol levels. These may be checked every 5 years starting at age 95.  Skin check.  Hepatitis C blood test.  Hepatitis B blood test.  Sexually transmitted disease (STD) testing.  BRCA-related cancer screening. This may be done if you have a family history of breast, ovarian, tubal, or peritoneal cancers.  Pelvic exam and Pap test. This may be done every 3 years starting at age 5. Starting at age 79, this may be done  every 5 years if you have a Pap test in combination with an HPV test.  Discuss your test results, treatment options, and if necessary, the need for more tests with your health care provider. Vaccines Your health care provider may recommend certain vaccines, such as:  Influenza vaccine. This is recommended every year.  Tetanus, diphtheria, and acellular pertussis (Tdap, Td) vaccine. You may need a Td booster every 10 years.  Varicella vaccine. You may need this if you have not been vaccinated.  HPV vaccine. If you are 57 or younger, you may need three doses over 6 months.  Measles, mumps, and rubella (MMR) vaccine. You may need at least one dose of MMR. You may also need a second dose.  Pneumococcal 13-valent conjugate (PCV13) vaccine. You may need this if you have certain conditions and were not previously vaccinated.  Pneumococcal polysaccharide (PPSV23) vaccine. You may need one or two doses if you smoke cigarettes or if you have certain conditions.  Meningococcal vaccine. One dose is recommended if you are age 62-21 years and a first-year college student living in a residence hall, or if you have one of several medical conditions. You may also need additional booster doses.  Hepatitis A vaccine. You may need this if you have certain conditions or if you travel or work in places where you may be exposed to hepatitis A.  Hepatitis B vaccine. You may need this if you have certain conditions or if you travel or work in places where you may be exposed to hepatitis B.  Haemophilus influenzae type b (Hib) vaccine. You may need this  if you have certain risk factors.  Talk to your health care provider about which screenings and vaccines you need and how often you need them. This information is not intended to replace advice given to you by your health care provider. Make sure you discuss any questions you have with your health care provider. Document Released: 07/11/2001 Document Revised:  02/02/2016 Document Reviewed: 03/16/2015 Elsevier Interactive Patient Education  2018 [employer]  

## 2018-05-27 ENCOUNTER — Encounter: Attending: Specialist

## 2018-05-30 ENCOUNTER — Other Ambulatory Visit: Payer: Self-pay | Admitting: Obstetrics and Gynecology

## 2018-05-31 ENCOUNTER — Ambulatory Visit: Admit: 2018-05-31 | Discharge: 2018-05-31 | Payer: PRIVATE HEALTH INSURANCE | Attending: Specialist

## 2018-05-31 DIAGNOSIS — Z01419 Encounter for gynecological examination (general) (routine) without abnormal findings: Secondary | ICD-10-CM

## 2018-05-31 MED ORDER — METRONIDAZOLE 500 MG PO TABS
500 MG | ORAL_TABLET | Freq: Two times a day (BID) | ORAL | 0 refills | Status: AC
Start: 2018-05-31 — End: 2018-06-07

## 2018-05-31 MED ORDER — FLUCONAZOLE 100 MG PO TABS
100 MG | ORAL_TABLET | Freq: Every day | ORAL | 0 refills | Status: AC
Start: 2018-05-31 — End: 2018-06-07

## 2018-05-31 NOTE — Progress Notes (Signed)
Subjective:      Patient ID: Olivia Castro is a 38 y.o. female.  Chief Complaint   Patient presents with   ??? Gynecologic Exam     Patient is here today for her annual PAP. She c/o left breast enlargment.      BP 100/68 (Site: Left Upper Arm, Position: Sitting, Cuff Size: Medium Adult)    Pulse 76    Ht 5\' 4"  (1.626 m)    Wt 186 lb (84.4 kg)    LMP 05/17/2018    BMI 31.93 kg/m??   Patient's last menstrual period was 05/17/2018.    [id number]    No past medical history on file.  Current Outpatient Medications Ordered in Epic   Medication Sig Dispense Refill   ??? vitamin B-12 (CYANOCOBALAMIN) 100 MCG tablet Vitamin B12   1 daily     ??? Cholecalciferol (VITAMIN D) 10 MCG/ML LIQD Vitamin D   500 mg daily     ??? metroNIDAZOLE (FLAGYL) 500 MG tablet Take 1 tablet by mouth 2 times daily for 7 days 14 tablet 0   ??? fluconazole (DIFLUCAN) 100 MG tablet Take 1 tablet by mouth daily for 7 days 7 tablet 0   ??? omeprazole (PRILOSEC) 20 MG delayed release capsule Take 1 capsule by mouth daily (Patient not taking: Reported on 05/31/2018) 30 capsule 0   ??? ondansetron (ZOFRAN ODT) 4 MG disintegrating tablet Take 1 tablet by mouth every 8 hours as needed for Nausea (Patient not taking: Reported on 05/31/2018) 12 tablet 0     No current Epic-ordered facility-administered medications on file.      Problem List Items Addressed This Visit     Well woman exam - Primary    Relevant Orders    PAP Smear    Acute vaginitis    Relevant Orders    PAP Smear        Allergies   Allergen Reactions   ??? Iodine      Orders Placed This Encounter   Procedures   ??? PAP Smear     Patient History:    Patient's last menstrual period was 05/17/2018.  OBGYN Status: Having periods  Past Surgical History:  No date: ECTOPIC PREGNANCY SURGERY      Social History    Tobacco Use      Smoking status: Never Smoker      Smokeless tobacco: Never Used       Standing Status:   Future     Standing Expiration Date:   06/01/2019     Order Specific Question:   Collection Type      Answer:   Thin Prep     Order Specific Question:   Prior Abnormal Pap Test     Answer:   No     Order Specific Question:   Screening or Diagnostic     Answer:   Screening     Order Specific Question:   HPV Requested?     Answer:   Yes     Order Specific Question:   High Risk Patient     Answer:   N/A        Patient is here today for her annual exam.  Patient complains that her left breast is noticeably larger than her right breast.  Patient states that her mother had lung cancer.  Her father has lung and thyroid cancer.  patient states that her aunty had breast cancer.  She denies nausea, vomiting and fever.  Review of Systems   Constitutional: Negative for activity change, appetite change and fever.   HENT: Negative for ear discharge and ear pain.    Eyes: Negative for pain and visual disturbance.   Respiratory: Negative for apnea and cough.    Cardiovascular: Negative for chest pain, palpitations and leg swelling.   Gastrointestinal: Negative for abdominal distention, abdominal pain, constipation, diarrhea, nausea and vomiting.   Endocrine: Negative.    Genitourinary: Negative for difficulty urinating, dysuria, menstrual problem and pelvic pain.        Larger left breast   Musculoskeletal: Negative for neck pain and neck stiffness.   Skin: Negative.    Neurological: Negative for light-headedness and numbness.   Hematological: Negative.  Does not bruise/bleed easily.       Objective:   Physical Exam  Vitals signs and nursing note reviewed. Exam conducted with a chaperone present.   Constitutional:       Appearance: She is well-developed.   HENT:      Head: Normocephalic and atraumatic.   Neck:      Thyroid: No thyroid mass or thyromegaly.   Cardiovascular:      Rate and Rhythm: Normal rate and regular rhythm.   Pulmonary:      Effort: Pulmonary effort is normal.      Breath sounds: Normal breath sounds.   Chest:      Breasts:         Right: Normal. No inverted nipple, mass, nipple discharge, skin change or  tenderness.         Left: Normal. No inverted nipple, mass, nipple discharge, skin change or tenderness.      Comments: Left breast not significantly, or abnormally, larger than right  Abdominal:      General: Bowel sounds are normal. There is no distension.      Palpations: Abdomen is soft. There is no mass.      Tenderness: There is no tenderness. There is no guarding or rebound.      Hernia: There is no hernia in the right inguinal area or left inguinal area.   Genitourinary:     General: Normal vulva.      Exam position: Supine.      Labia:         Right: No rash or lesion.         Left: No rash or lesion.       Vagina: No signs of injury. Vaginal discharge present. No tenderness.      Cervix: Discharge present. No cervical motion tenderness or friability.      Uterus: Not enlarged, not fixed and not tender.       Adnexa:         Right: No mass or tenderness.          Left: No mass or tenderness.        Rectum: Normal. No mass or anal fissure. Normal anal tone.   Musculoskeletal: Normal range of motion.         General: No tenderness.   Skin:     General: Skin is warm and dry.   Neurological:      Mental Status: She is alert and oriented to person, place, and time.   Psychiatric:         Judgment: Judgment normal.         Assessment:       Patient with vaginitis, otherwise normal annual exam. Pap smear was done.  Will treat with Flagyl and  Diflucan. Reassurance provided that it is not uncommon to have unequal breast size and the difference it not significant.      Plan:      Orders Placed This Encounter   Procedures   ??? PAP Smear     Orders Placed This Encounter   Medications   ??? metroNIDAZOLE (FLAGYL) 500 MG tablet     Sig: Take 1 tablet by mouth 2 times daily for 7 days     Dispense:  14 tablet     Refill:  0   ??? fluconazole (DIFLUCAN) 100 MG tablet     Sig: Take 1 tablet by mouth daily for 7 days     Dispense:  7 tablet     Refill:  0      Appointment in 1 year.    Solon PalmI, Doris Wisniewski, am scribing for, and in  the presence of Dr. Erline Levinelaudio E. Melondy Blanchard.   Electronically signed by: Meyer Coryoris Wisniewski 05/31/18 11:12 AM       I agree to the above documentation placed by my scribe Meyer Coryoris Wisniewski. I reviewed the scribe's note and agree with the documented findings and plan of care. Any areas of disagreement are noted on the chart.   I have personally evaluated this patient. Additional findings are as noted. I agree with the chief complaint, past medical history, past surgical history, allergies, medications, social and family history as documented unless otherwise noted below.     Electronically signed by Erline Levinelaudio E Ameer Sanden, MD on 06/02/2018 at 6:37 AM

## 2018-06-05 ENCOUNTER — Encounter

## 2018-06-05 LAB — PAP SMEAR

## 2018-07-17 ENCOUNTER — Other Ambulatory Visit: Payer: Self-pay | Admitting: *Deleted

## 2018-07-17 MED ORDER — FLUOXETINE HCL 10 MG PO CAPS: 10.0000 mg | ORAL_CAPSULE | Freq: Two times a day (BID) | ORAL | 6 refills | Status: DC

## 2018-07-19 ENCOUNTER — Other Ambulatory Visit: Payer: Self-pay | Admitting: Obstetrics and Gynecology

## 2018-08-19 ENCOUNTER — Inpatient Hospital Stay
Admit: 2018-08-19 | Discharge: 2018-08-19 | Disposition: A | Discharge status: Home / Self Care | Payer: PRIVATE HEALTH INSURANCE | Attending: Emergency Medicine

## 2018-08-19 ENCOUNTER — Emergency Department: Admit: 2018-08-19 | Payer: PRIVATE HEALTH INSURANCE

## 2018-08-19 DIAGNOSIS — R1012 Left upper quadrant pain: Secondary | ICD-10-CM

## 2018-08-19 LAB — COMPREHENSIVE METABOLIC PANEL
ALT: 31 U/L (ref 5–33)
AST: 39 U/L — ABNORMAL HIGH (ref <32)
Albumin/Globulin Ratio: 1 (ref 1.0–2.5)
Albumin: 3.7 g/dL (ref 3.5–5.2)
Alkaline Phosphatase: 77 U/L (ref 35–104)
Anion Gap: 15 mmol/L (ref 9–17)
BUN: 10 mg/dL (ref 6–20)
CO2: 22 mmol/L (ref 20–31)
Calcium: 9.2 mg/dL (ref 8.6–10.4)
Chloride: 101 mmol/L (ref 98–107)
Creatinine: 0.9 mg/dL (ref 0.50–0.90)
GFR African American: >60 mL/min (ref >60)
GFR Non-African American: >60 mL/min (ref >60)
Glucose: 160 mg/dL — ABNORMAL HIGH (ref 70–99)
Potassium: 3.9 mmol/L (ref 3.7–5.3)
Sodium: 138 mmol/L (ref 135–144)
Total Bilirubin: 0.3 mg/dL (ref 0.3–1.2)
Total Protein: 7.5 g/dL (ref 6.4–8.3)

## 2018-08-19 LAB — HCG, SERUM, QUALITATIVE: Preg, Serum: NEGATIVE

## 2018-08-19 LAB — URINALYSIS WITH REFLEX TO CULTURE
Bilirubin Urine: NEGATIVE
Glucose, Ur: NEGATIVE
Ketones, Urine: NEGATIVE
Leukocyte Esterase, Urine: NEGATIVE
Nitrite, Urine: NEGATIVE
Protein, UA: NEGATIVE
Specific Gravity, UA: 1.025 (ref 1.005–1.030)
Urine Hgb: NEGATIVE
Urobilinogen, Urine: NORMAL
pH, UA: 5 (ref 5.0–8.0)

## 2018-08-19 LAB — CBC WITH AUTO DIFFERENTIAL
Basophils %: 0 % (ref 0–2)
Basophils Absolute: 0 10*3/uL (ref 0.0–0.2)
Eosinophils %: 0 % — ABNORMAL LOW (ref 1–4)
Eosinophils Absolute: 0 10*3/uL (ref 0.0–0.4)
Hematocrit: 40.4 % (ref 36–46)
Hemoglobin: 12.8 g/dL (ref 12.0–16.0)
Lymphocytes Absolute: 1.1 10*3/uL (ref 1.0–4.8)
Lymphocytes: 11 % — ABNORMAL LOW (ref 24–44)
MCH: 27.8 pg (ref 26–34)
MCHC: 31.7 g/dL (ref 31–37)
MCV: 87.7 fL (ref 80–100)
MPV: 7.5 fL (ref 6.0–12.0)
Monocytes %: 1 % — ABNORMAL LOW (ref 2–11)
Monocytes Absolute: 0.1 10*3/uL (ref 0.1–1.2)
Neutrophils Absolute: 9.1 10*3/uL — ABNORMAL HIGH (ref 1.8–7.7)
Platelets: 310 10*3/uL (ref 140–450)
RBC: 4.61 m/uL (ref 4.0–5.2)
RDW: 14.7 % (ref 12.5–15.4)
Seg Neutrophils: 88 % — ABNORMAL HIGH (ref 36–66)
WBC: 10.4 10*3/uL (ref 3.5–11.0)

## 2018-08-19 LAB — LIPASE: Lipase: 17 U/L (ref 13–60)

## 2018-08-19 MED ORDER — ONDANSETRON HCL 4 MG/2ML IJ SOLN
4 MG/2ML | Freq: Once | INTRAMUSCULAR | Status: AC
Start: 2018-08-19 — End: 2018-08-19
  Administered 2018-08-19: 11:00:00 4 mg via INTRAVENOUS

## 2018-08-19 MED ORDER — SODIUM CHLORIDE 0.9 % IV BOLUS
0.9 % | Freq: Once | INTRAVENOUS | Status: AC
Start: 2018-08-19 — End: 2018-08-19
  Administered 2018-08-19: 11:00:00 1000 mL via INTRAVENOUS

## 2018-08-19 MED ORDER — FAMOTIDINE 20 MG/2ML IV SOLN
20 MG/2ML | Freq: Once | INTRAVENOUS | Status: AC
Start: 2018-08-19 — End: 2018-08-19
  Administered 2018-08-19: 11:00:00 20 mg via INTRAVENOUS

## 2018-08-19 MED ORDER — MORPHINE SULFATE (PF) 4 MG/ML IJ SOLN
4 MG/ML | Freq: Once | INTRAMUSCULAR | Status: AC
Start: 2018-08-19 — End: 2018-08-19
  Administered 2018-08-19: 11:00:00 4 mg via INTRAVENOUS

## 2018-08-19 MED ORDER — ONDANSETRON HCL 4 MG PO TABS
4 MG | ORAL_TABLET | Freq: Three times a day (TID) | ORAL | 0 refills | Status: AC | PRN
Start: 2018-08-19 — End: 2018-08-21

## 2018-08-19 MED ORDER — DICYCLOMINE HCL 10 MG PO CAPS
10 MG | ORAL_CAPSULE | Freq: Three times a day (TID) | ORAL | 0 refills | Status: DC | PRN
Start: 2018-08-19 — End: 2018-11-15

## 2018-08-19 MED FILL — FAMOTIDINE 20 MG/2ML IV SOLN: 20 MG/2ML | INTRAVENOUS | Qty: 2

## 2018-08-19 MED FILL — ONDANSETRON HCL 4 MG/2ML IJ SOLN: 4 MG/2ML | INTRAMUSCULAR | Qty: 2

## 2018-08-19 MED FILL — MORPHINE SULFATE 4 MG/ML IJ SOLN: 4 mg/mL | INTRAMUSCULAR | Qty: 1

## 2018-08-19 NOTE — ED Notes (Signed)
Dr Lorin Picket at bedside to evaluate.      Lora Paula, RN  08/19/18 [phone]

## 2018-08-19 NOTE — ED Provider Notes (Signed)
[facility] ED  [address] [place] [address]  Phone: [phone]        [facility] ED  eMERGENCY dEPARTMENT eNCOUnter      Pt Name: Olivia Castro  MRN: [id number]  Birthdate Jan 17, 1981  Date of evaluation: 08/19/2018  Provider: Delbert Phenix, DO    CHIEF COMPLAINT       Chief Complaint   Patient presents with   ??? Abdominal Pain     since 0300, LUQ "twisting pressure" constant    ??? Emesis     x1 since 0300   ??? Diarrhea     x1 since 0300         HISTORY OF PRESENT ILLNESS   (Location/Symptom, Timing/Onset,Context/Setting, Quality, Duration, Modifying Factors, Severity)  Note limiting factors.   Olivia Castro is a 38 y.o. female who presents to the emergency department for the evaluation of abdominal pain, vomiting and diarrhea.  Patient states that she woke up around 3 AM and was having a twisting, pressure sensation in the left upper quadrant of her abdomen.  She thought maybe this could be an acid problem and she took Tums but this did not help.  She started having nonbloody diarrhea.  She tried to drink some water and that is when she started having nonbloody vomiting.  Patient denies recent travel or sick contacts.  She denies fever or cough.  She denies dysuria or hematuria.  Patient has no other acute complaints at this time and denies history of recurring abdominal pain or problems such as this.    Nursing Notes were reviewed.    REVIEW OF SYSTEMS    (2-9systems for level 4, 10 or more for level 5)     Review of Systems   Constitutional: Negative for fever.   Respiratory: Negative for cough.    Gastrointestinal: Positive for abdominal pain, diarrhea and vomiting.   Musculoskeletal: Negative for back pain.   Skin: Negative for rash.   Neurological: Negative for weakness and numbness.       Except asnoted above the remainder of the review of systems was reviewed and negative.       PAST MEDICAL HISTORY   History reviewed. No pertinent past medical history.      SURGICAL  HISTORY       Past Surgical History:   Procedure Laterality Date   ??? ECTOPIC PREGNANCY SURGERY           CURRENT MEDICATIONS     Previous Medications    CHOLECALCIFEROL (VITAMIN D) 10 MCG/ML LIQD    Vitamin D   500 mg daily    OMEPRAZOLE (PRILOSEC) 20 MG DELAYED RELEASE CAPSULE    Take 1 capsule by mouth daily    ONDANSETRON (ZOFRAN ODT) 4 MG DISINTEGRATING TABLET    Take 1 tablet by mouth every 8 hours as needed for Nausea    VITAMIN B-12 (CYANOCOBALAMIN) 100 MCG TABLET    Vitamin B12   1 daily       ALLERGIES     Iodine    FAMILY HISTORY       Family History   Problem Relation Age of Onset   ??? Lung Cancer Maternal Grandmother    ??? Diabetes Father    ??? Hypertension Father    ??? High Blood Pressure Father    ??? Prostate Cancer Father    ??? Diabetes Mother    ??? Hypertension Mother    ??? High Blood Pressure Mother  SOCIAL HISTORY       Social History     Socioeconomic History   ??? Marital status: Single     Spouse name: None   ??? Number of children: None   ??? Years of education: None   ??? Highest education level: None   Occupational History   ??? None   Social Needs   ??? Financial resource strain: None   ??? Food insecurity     Worry: None     Inability: None   ??? Transportation needs     Medical: None     Non-medical: None   Tobacco Use   ??? Smoking status: Never Smoker   ??? Smokeless tobacco: Never Used   Substance and Sexual Activity   ??? Alcohol use: No   ??? Drug use: No   ??? Sexual activity: Never   Lifestyle   ??? Physical activity     Days per week: None     Minutes per session: None   ??? Stress: None   Relationships   ??? Social Wellsite geologistconnections     Talks on phone: None     Gets together: None     Attends religious service: None     Active member of club or organization: None     Attends meetings of clubs or organizations: None     Relationship status: None   ??? Intimate partner violence     Fear of current or ex partner: None     Emotionally abused: None     Physically abused: None     Forced sexual activity: None   Other Topics  Concern   ??? None   Social History Narrative   ??? None       SCREENINGS             PHYSICAL EXAM    (up to 7 for level 4, 8 or more for level 5)     ED Triage Vitals [08/19/18 0658]   BP Temp Temp Source Pulse Resp SpO2 Height Weight   113/80 97.6 ??F (36.4 ??C) Oral 72 22 100 % 5\' 4"  (1.626 m) 185 lb (83.9 kg)       Physical Exam  Vitals signs and nursing note reviewed.   Constitutional:       General: She is not in acute distress.     Appearance: Normal appearance. She is not ill-appearing or toxic-appearing.   HENT:      Head: Normocephalic and atraumatic.      Nose: Nose normal. No congestion.      Mouth/Throat:      Mouth: Mucous membranes are moist.   Eyes:      General:         Right eye: No discharge.         Left eye: No discharge.      Conjunctiva/sclera: Conjunctivae normal.   Neck:      Musculoskeletal: Normal range of motion.   Cardiovascular:      Rate and Rhythm: Normal rate and regular rhythm.      Pulses: Normal pulses.      Heart sounds: Normal heart sounds. No murmur.   Pulmonary:      Effort: Pulmonary effort is normal. No respiratory distress.      Breath sounds: Normal breath sounds. No wheezing.   Abdominal:      General: Abdomen is flat. There is no distension.      Palpations: Abdomen is soft. There is no mass.  Tenderness: There is abdominal tenderness. There is no guarding or rebound.      Hernia: No hernia is present.      Comments: Patient has mild tenderness in the left upper quadrant as well as mild tenderness in mid epigastric region.  There is no distention, rigidity or guarding.   Musculoskeletal: Normal range of motion.         General: No deformity or signs of injury.   Skin:     General: Skin is warm and dry.      Capillary Refill: Capillary refill takes less than 2 seconds.      Findings: No rash.   Neurological:      General: No focal deficit present.      Mental Status: She is alert. Mental status is at baseline.      Motor: No weakness.      Comments: Speaking normally. No  facial asymmetry. Moving all 4 extremities. Normal gait.    Psychiatric:         Mood and Affect: Mood normal.         EMERGENCY DEPARTMENT COURSE and DIFFERENTIAL DIAGNOSIS/MDM:   Vitals:    Vitals:    08/19/18 0658   BP: 113/80   Pulse: 72   Resp: 22   Temp: 97.6 ??F (36.4 ??C)   TempSrc: Oral   SpO2: 100%   Weight: 83.9 kg (185 lb)   Height: 5\' 4"  (1.626 m)       Patient presents to the emergency department with the complaints described above.  Vital signs are normal.  Patient nontoxic.  Physical examination reveals abdominal tenderness is described.  At this time the differential diagnosis is broad.  I do think the patient needs a metabolic work-up that includes hepatobiliary test, I am also going to get a CT of the abdomen and pelvis without contrast, this is secondary to an iodine allergy.  I have ordered IV fluids, IV antacids, IV pain meds and IV antiemetics and I will reevaluate.      DIAGNOSTIC RESULTS     LABS:  Labs Reviewed   CBC WITH AUTO DIFFERENTIAL - Abnormal; Notable for the following components:       Result Value    Seg Neutrophils 88 (*)     Lymphocytes 11 (*)     Monocytes 1 (*)     Eosinophils % 0 (*)     Segs Absolute 9.10 (*)     All other components within normal limits   COMPREHENSIVE METABOLIC PANEL - Abnormal; Notable for the following components:    Glucose 160 (*)     AST 39 (*)     All other components within normal limits   LIPASE   HCG, SERUM, QUALITATIVE   URINE RT REFLEX TO CULTURE       All other labs were within normal range or not returned as of this dictation.    RADIOLOGY:  CT ABDOMEN PELVIS WO CONTRAST Additional Contrast? None   Final Result   1. Question of minimal thickening of the appendiceal wall without significant   surrounding inflammatory change.  This could represent very early acute   appendicitis.   2. Otherwise, no acute abnormality identified within the unenhanced abdomen   or pelvis.   3. No evidence of urolithiasis or obstructive uropathy.   4. Multiple  hypodense lesions are seen within the liver measuring up to 2.1   cm.  These could represent hepatic hemangiomas.  This can be confirmed with a  nonemergent liver protocol MRI.   5. Small fat filled umbilical hernia.               ED Course as of Aug 18 824   Mon Aug 19, 2018   1610 Laboratory results are unremarkable.  There is no leukocytosis.  Urine unremarkable.  Patient improved and states her pain has resolved.  Her vitals remain normal.  CT scan reveals known hemangiomas, patient is aware of these.  Also reveals a little bit of inflammation in the appendix which I discussed with her.  I explained that if her pain returns, persists, especially if migrates to the right lower quadrant or fever develop she should return to the ER as I cannot rule out appendicitis at this time though I have very low suspicion for it.  Suspect viral syndrome.    At this time the patient is without objective evidence of an acute process requiring hospitalization or inpatient management. They have remained hemodynamically stable and are stable for discharge with outpatient follow-up. Standard anticipatory guidance given to patient upon discharge.  Have given them a specific time frame in which to follow-up and who to follow-up with.  I have also advised them that they should return to the emergency department if they get worse, or not getting better or develop any new or concerning symptoms.  Patient demonstrates understanding.    [TS]      ED Course User Index  [TS] Delbert Phenix, DO         PROCEDURES:  Unless otherwise noted below, none     Procedures    FINAL IMPRESSION      1. Pain of upper abdomen    2. Gastroenteritis          DISPOSITION/PLAN   DISPOSITION Decision To Discharge 08/19/2018 08:24:44 AM      PATIENT REFERRED TO:  Your primary doctor  If you do not have a primary care physician or you are looking for a new physician, please contact the following number [phone] to establish one.  In 3  days        DISCHARGE MEDICATIONS:  New Prescriptions    DICYCLOMINE (BENTYL) 10 MG CAPSULE    Take 1 capsule by mouth 3 times daily as needed (stomach cramps)    ONDANSETRON (ZOFRAN) 4 MG TABLET    Take 1 tablet by mouth every 8 hours as needed for Nausea or Vomiting          (Please note that portions of this note were completed with a voice recognition program.  Efforts were made to edit the dictations but occasionally words are mis-transcribed.)    Delbert Phenix, DO (electronically signed)  Board Certified Emergency Physician         Delbert Phenix, DO  08/19/18 [phone]

## 2018-08-19 NOTE — Discharge Instructions (Signed)
Please make an appointment to follow up with your primary doctor and/or the specialist as we discussed. Take all medications as prescribed.  Return to ER if condition worsens or you develop any new/concerning symptoms as we discussed.

## 2018-09-18 MED FILL — FLUoxetine HCL 20 MG CAPS: 20 | 30 days supply | Qty: 60 | Fill #0

## 2018-09-18 MED FILL — LEVOTHYROXINE 75 MCG TABLET: 75 | 90 days supply | Qty: 90 | Fill #0

## 2018-10-12 ENCOUNTER — Other Ambulatory Visit: Payer: Self-pay | Admitting: Obstetrics and Gynecology

## 2018-10-14 MED FILL — FLUoxetine HCL 20 MG CAPS: 20 | 60 days supply | Qty: 60 | Fill #0

## 2018-10-15 ENCOUNTER — Other Ambulatory Visit: Payer: Self-pay | Admitting: Obstetrics and Gynecology

## 2018-11-15 ENCOUNTER — Ambulatory Visit: Admit: 2018-11-15 | Discharge: 2018-11-15 | Payer: PRIVATE HEALTH INSURANCE | Attending: Specialist

## 2018-11-15 DIAGNOSIS — N760 Acute vaginitis: Secondary | ICD-10-CM

## 2018-11-15 LAB — C.TRACHOMATIS N.GONORRHOEAE DNA: C. Trachomatis Amplified: NEGATIVE

## 2018-11-15 MED ORDER — METRONIDAZOLE 500 MG PO TABS
500 MG | ORAL_TABLET | Freq: Two times a day (BID) | ORAL | 0 refills | Status: AC
Start: 2018-11-15 — End: 2018-11-22

## 2018-11-15 MED ORDER — FLUCONAZOLE 100 MG PO TABS
100 MG | ORAL_TABLET | Freq: Every day | ORAL | 0 refills | Status: AC
Start: 2018-11-15 — End: 2018-11-22

## 2018-11-15 NOTE — Progress Notes (Signed)
Subjective:      Patient ID: Olivia Castro is a 38 y.o. female.  Chief Complaint   Patient presents with   ??? Sexually Transmitted Diseases     patient is here today for an STD check.     BP 108/69 (Site: Right Upper Arm, Position: Sitting, Cuff Size: Medium Adult)    Pulse 64    Temp 97 ??F (36.1 ??C) (Oral)    Wt 183 lb (83 kg)    BMI 31.41 kg/m??   No LMP recorded.    [id number]    No past medical history on file.  Current Outpatient Medications Ordered in Epic   Medication Sig Dispense Refill   ??? metroNIDAZOLE (FLAGYL) 500 MG tablet Take 1 tablet by mouth 2 times daily for 7 days 14 tablet 0   ??? fluconazole (DIFLUCAN) 100 MG tablet Take 1 tablet by mouth daily for 7 days 7 tablet 0   ??? Multiple Vitamins-Minerals (MULTI COMPLETE PO) 1 Tablet       No current Epic-ordered facility-administered medications on file.      Problem List Items Addressed This Visit     Screen for STD (sexually transmitted disease)    Relevant Orders    VAGINITIS DNA PROBE    C.trachomatis N.gonorrhoeae DNA    Acute vaginitis - Primary    Relevant Orders    VAGINITIS DNA PROBE    C.trachomatis N.gonorrhoeae DNA        Allergies   Allergen Reactions   ??? Iodine      Orders Placed This Encounter   Procedures   ??? VAGINITIS DNA PROBE     Standing Status:   Future     Standing Expiration Date:   01/15/2019   ??? C.trachomatis N.gonorrhoeae DNA     Standing Status:   Future     Standing Expiration Date:   01/15/2019     Order Specific Question:   Source:     Answer:   Cervical        Patient is here to be tested for STDs today.  She denies any gynecological complaints at this time.       Review of Systems   Constitutional: Negative for activity change, appetite change and fever.   HENT: Negative for ear discharge and ear pain.    Eyes: Negative for pain and visual disturbance.   Respiratory: Negative for apnea and cough.    Cardiovascular: Negative for chest pain, palpitations and leg swelling.   Gastrointestinal: Negative for abdominal distention,  abdominal pain, constipation, diarrhea, nausea and vomiting.   Endocrine: Negative.    Genitourinary: Negative for difficulty urinating, dysuria, menstrual problem and pelvic pain.   Musculoskeletal: Negative for neck pain and neck stiffness.   Skin: Negative.    Neurological: Negative for light-headedness and numbness.   Hematological: Negative.  Does not bruise/bleed easily.       Objective:   Physical Exam  Vitals signs and nursing note reviewed. Exam conducted with a chaperone present.   Constitutional:       Appearance: She is well-developed.   HENT:      Head: Normocephalic and atraumatic.   Neck:      Musculoskeletal: Normal range of motion and neck supple.      Thyroid: No thyromegaly.   Cardiovascular:      Rate and Rhythm: Normal rate and regular rhythm.   Pulmonary:      Effort: Pulmonary effort is normal.      Breath sounds: Normal  breath sounds. No wheezing.   Abdominal:      General: Bowel sounds are normal. There is no distension.      Palpations: Abdomen is soft. There is no mass.      Tenderness: There is no abdominal tenderness. There is no guarding.   Genitourinary:     General: Normal vulva.      Vagina: Vaginal discharge present. No bleeding.      Cervix: Discharge present. No cervical bleeding.   Musculoskeletal: Normal range of motion.   Skin:     General: Skin is dry.   Neurological:      Mental Status: She is alert and oriented to person, place, and time.   Psychiatric:         Behavior: Behavior normal.         Thought Content: Thought content normal.         Assessment:      Patient with vaginitis.  Cultures were done.  Will treat with Flagyl and Diflucan.  Patient was advised of information received from the task force regarding Covid-19.   Patient was told to use some kind of mouth and nose covering mask when she does need to leave her home.       Plan:      Orders Placed This Encounter   Procedures   ??? VAGINITIS DNA PROBE   ??? C.trachomatis N.gonorrhoeae DNA     Orders Placed This  Encounter   Medications   ??? metroNIDAZOLE (FLAGYL) 500 MG tablet     Sig: Take 1 tablet by mouth 2 times daily for 7 days     Dispense:  14 tablet     Refill:  0   ??? fluconazole (DIFLUCAN) 100 MG tablet     Sig: Take 1 tablet by mouth daily for 7 days     Dispense:  7 tablet     Refill:  0      Appointment in 1 year.    Collier Salina, am scribing for, and in the presence of Dr. Roxanna Mew.   Electronically signed by: Karma Ganja 11/15/18 9:30 AM       I agree to the above documentation placed by my scribe Karma Ganja. I reviewed the scribe's note and agree with the documented findings and plan of care. Any areas of disagreement are noted on the chart.   I have personally evaluated this patient. Additional findings are as noted. I agree with the chief complaint, past medical history, past surgical history, allergies, medications, social and family history as documented unless otherwise noted below.     Electronically signed by Roxanna Mew, MD on 11/16/2018 at 11:09 AM

## 2018-11-25 ENCOUNTER — Encounter

## 2018-12-09 ENCOUNTER — Other Ambulatory Visit: Payer: Self-pay

## 2018-12-09 MED ORDER — FLUOXETINE HCL 20 MG PO CAPS: 20.0000 mg | ORAL_CAPSULE | Freq: Two times a day (BID) | ORAL | 0 refills | Status: DC

## 2018-12-11 ENCOUNTER — Other Ambulatory Visit: Payer: Self-pay | Admitting: Obstetrics and Gynecology

## 2018-12-11 MED FILL — LEVOTHYROXINE 75 MCG TABLET: 75 | 30 days supply | Qty: 30 | Fill #0

## 2019-01-01 ENCOUNTER — Other Ambulatory Visit: Payer: Self-pay | Admitting: Obstetrics and Gynecology

## 2019-01-01 MED ORDER — CITALOPRAM HYDROBROMIDE 20 MG PO TABS: 20.0000 mg | ORAL_TABLET | Freq: Every day | ORAL | 12 refills | Status: DC

## 2019-01-04 MED FILL — LEVOTHYROXINE 75 MCG TABLET: 75 | 30 days supply | Qty: 30 | Fill #1

## 2019-02-04 MED FILL — LEVOTHYROXINE 75 MCG TABLET: 75 | 30 days supply | Qty: 30 | Fill #2

## 2019-03-05 MED FILL — LEVOTHYROXINE 75 MCG TABLET: 75 | 30 days supply | Qty: 30 | Fill #3

## 2019-03-26 ENCOUNTER — Ambulatory Visit (INDEPENDENT_AMBULATORY_CARE_PROVIDER_SITE_OTHER): Payer: No Typology Code available for payment source | Admitting: Obstetrics and Gynecology

## 2019-03-26 ENCOUNTER — Other Ambulatory Visit: Payer: Self-pay

## 2019-03-26 ENCOUNTER — Encounter: Payer: No Typology Code available for payment source | Admitting: Obstetrics and Gynecology

## 2019-03-26 ENCOUNTER — Encounter: Payer: Self-pay | Admitting: Obstetrics and Gynecology

## 2019-03-26 VITALS — BP 109/74 | HR 69 | Ht 59.0 in | Wt 165.2 lb

## 2019-03-26 DIAGNOSIS — Z01419 Encounter for gynecological examination (general) (routine) without abnormal findings: Secondary | ICD-10-CM

## 2019-03-26 DIAGNOSIS — N920 Excessive and frequent menstruation with regular cycle: Secondary | ICD-10-CM

## 2019-03-26 DIAGNOSIS — E039 Hypothyroidism, unspecified: Secondary | ICD-10-CM

## 2019-03-26 MED ORDER — LEVOTHYROXINE SODIUM 75 MCG PO TABS: 75.0000 ug | ORAL_TABLET | Freq: Every day | ORAL | 4 refills | Status: DC

## 2019-03-26 MED ORDER — ETONOGESTREL-ETHINYL ESTRADIOL 0.12-0.015 MG/24HR VA RING: VAGINAL_RING | VAGINAL | 4 refills | Status: DC

## 2019-03-26 MED FILL — LEVOTHYROXINE 75 MCG TABLET: 75 | 90 days supply | Qty: 90 | Fill #0

## 2019-03-26 NOTE — Patient Instructions (Signed)
 Preventive Care 21-36 Years Old, Female Preventive care refers to visits with your health care provider and lifestyle choices that can promote health and wellness. This includes:  A yearly physical exam. This may also be called an annual well check.  Regular dental visits and eye exams.  Immunizations.  Screening for certain conditions.  Healthy lifestyle choices, such as eating a healthy diet, getting regular exercise, not using drugs or products that contain nicotine and tobacco, and limiting alcohol use. What can I expect for my preventive care visit? Physical exam Your health care provider will check your:  Height and weight. This may be used to calculate body mass index (BMI), which tells if you are at a healthy weight.  Heart rate and blood pressure.  Skin for abnormal spots. Counseling Your health care provider may ask you questions about your:  Alcohol, tobacco, and drug use.  Emotional well-being.  Home and relationship well-being.  Sexual activity.  Eating habits.  Work and work environment.  Method of birth control.  Menstrual cycle.  Pregnancy history. What immunizations do I need?  Influenza (flu) vaccine  This is recommended every year. Tetanus, diphtheria, and pertussis (Tdap) vaccine  You may need a Td booster every 10 years. Varicella (chickenpox) vaccine  You may need this if you have not been vaccinated. Human papillomavirus (HPV) vaccine  If recommended by your health care provider, you may need three doses over 6 months. Measles, mumps, and rubella (MMR) vaccine  You may need at least one dose of MMR. You may also need a second dose. Meningococcal conjugate (MenACWY) vaccine  One dose is recommended if you are age 19-21 years and a first-year college student living in a residence hall, or if you have one of several medical conditions. You may also need additional booster doses. Pneumococcal conjugate (PCV13) vaccine  You may need  this if you have certain conditions and were not previously vaccinated. Pneumococcal polysaccharide (PPSV23) vaccine  You may need one or two doses if you smoke cigarettes or if you have certain conditions. Hepatitis A vaccine  You may need this if you have certain conditions or if you travel or work in places where you may be exposed to hepatitis A. Hepatitis B vaccine  You may need this if you have certain conditions or if you travel or work in places where you may be exposed to hepatitis B. Haemophilus influenzae type b (Hib) vaccine  You may need this if you have certain conditions. You may receive vaccines as individual doses or as more than one vaccine together in one shot (combination vaccines). Talk with your health care provider about the risks and benefits of combination vaccines. What tests do I need?  Blood tests  Lipid and cholesterol levels. These may be checked every 5 years starting at age 20.  Hepatitis C test.  Hepatitis B test. Screening  Diabetes screening. This is done by checking your blood sugar (glucose) after you have not eaten for a while (fasting).  Sexually transmitted disease (STD) testing.  BRCA-related cancer screening. This may be done if you have a family history of breast, ovarian, tubal, or peritoneal cancers.  Pelvic exam and Pap test. This may be done every 3 years starting at age 21. Starting at age 30, this may be done every 5 years if you have a Pap test in combination with an HPV test. Talk with your health care provider about your test results, treatment options, and if necessary, the need for more   tests. Follow these instructions at home: Eating and drinking   Eat a diet that includes fresh fruits and vegetables, whole grains, lean protein, and low-fat dairy.  Take vitamin and mineral supplements as recommended by your health care provider.  Do not drink alcohol if: ? Your health care provider tells you not to drink. ? You are  pregnant, may be pregnant, or are planning to become pregnant.  If you drink alcohol: ? Limit how much you have to 0-1 drink a day. ? Be aware of how much alcohol is in your drink. In the [place] one drink equals one 12 oz bottle of beer (355 mL), one 5 oz glass of wine (148 mL), or one 1 oz glass of hard liquor (44 mL). Lifestyle  Take daily care of your teeth and gums.  Stay active. Exercise for at least 30 minutes on 5 or more days each week.  Do not use any products that contain nicotine or tobacco, such as cigarettes, e-cigarettes, and chewing tobacco. If you need help quitting, ask your health care provider.  If you are sexually active, practice safe sex. Use a condom or other form of birth control (contraception) in order to prevent pregnancy and STIs (sexually transmitted infections). If you plan to become pregnant, see your health care provider for a preconception visit. What's next?  Visit your health care provider once a year for a well check visit.  Ask your health care provider how often you should have your eyes and teeth checked.  Stay up to date on all vaccines. This information is not intended to replace advice given to you by your health care provider. Make sure you discuss any questions you have with your health care provider. Document Released: 07/11/2001 Document Revised: 01/24/2018 Document Reviewed: 01/24/2018 Elsevier Patient Education  2020 [employer].

## 2019-03-26 NOTE — Progress Notes (Signed)
Patient here for annual exam, c/o shorter interval between menstrual cycles this month, heavy flow with clots.

## 2019-03-26 NOTE — Progress Notes (Signed)
Subjective:   Paula [name] is a 38 y.o. [id number] Caucasian female here for a routine well-woman exam.  Patient's last menstrual period was 03/21/2019 (exact date).    Current complaints: reports feeling so much better since adding L-Theanine 400mg  to celexa. Has a lot more energy. Does report low sex drive, but is ok with it. Has been treating shoulder injury since last year and is on prednisone right now for it. alos has had 2 menses that started early (nuvaring still in place) and was very heavy with clots for 4 days. First time in February second time this weekend. Concerned due to h/o PCOS and ovarian cysts.  PCP: ?       Does. desire labs  Social History: Sexual: heterosexual Marital Status: married Living situation: with family Occupation: March @ [employer] Tobacco/alcohol: no tobacco use Illicit drugs: no history of illicit drug use  The following portions of the patient's history were reviewed and updated as appropriate: allergies, current medications, past family history, past medical history, past social history, past surgical history and problem list.  Past Medical History Past Medical History:  Diagnosis Date  . Anemia   . Asthma    exercise induced  . BULIMIA 01/17/2008   Annotation: AGE 54 Qualifier: Diagnosis of  By: 01/19/2008 LPN, Lorrene Reid    . Complication of anesthesia    vomited during last c/s  . 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 Cystic fibrosis carrier in second trimester, antepartum 2016  . Depression   . Eczema   . GERD (gastroesophageal reflux disease)    chronic  . Headache    h/o migraines  . Hypothyroidism   . Nausea   . Oligomenorrhea 12/22/2014  . PONV (postoperative nausea and vomiting)   . Rhinitis, allergic     Past Surgical History Past Surgical History:  Procedure Laterality Date  . CESAREAN SECTION  04/19/2013   LTCS; CPD  . CESAREAN SECTION N/A 10/27/2015   Procedure: REPEAT CESAREAN SECTION;  Surgeon: 10/29/2015, MD;   Location: [employer] OR[facility];  Service: Obstetrics;  Laterality: N/A;  . ENDOSCOPIC TURBINATE REDUCTION Bilateral 07/26/2017   Procedure: ENDOSCOPIC INFERIOR  TURBINATE REDUCTION;  Surgeon: 07/28/2017, MD;  Location: [facility];  Service: ENT;  Laterality: Bilateral;  . FRONTAL SINUS EXPLORATION Bilateral 07/26/2017   Procedure: FRONTAL SINUS EXPLORATION;  Surgeon: 07/28/2017, MD;  Location: [facility];  Service: ENT;  Laterality: Bilateral;  . IMAGE GUIDED SINUS SURGERY Bilateral 07/26/2017   Procedure: IMAGE GUIDED SINUS SURGERY;  Surgeon: 07/28/2017, MD;  Location: [facility];  Service: ENT;  Laterality: Bilateral;  GAVE DISK TO CECE 2-12  . SEPTOPLASTY WITH ETHMOIDECTOMY, AND MAXILLARY ANTROSTOMY Bilateral 07/26/2017   Procedure: SEPTOPLASTY WITH TOTAL  ETHMOIDECTOMY, AND MAXILLARY ANTROSTOMYWITH REMOVAL OF TISSUE,;  Surgeon: 07/28/2017, MD;  Location: [facility];  Service: ENT;  Laterality: Bilateral;  . TONSILLECTOMY    . WISDOM TOOTH EXTRACTION      Gynecologic History [facility]  Patient's last menstrual period was 03/21/2019 (exact date). Contraception: NuvaRing vaginal inserts Last Pap: 2018. Results were: normal   Obstetric History OB History  Gravida Para Term Preterm AB Living  2 2 2     2   SAB TAB Ectopic Multiple Live Births        0 2    # Outcome Date GA Lbr Len/2nd Weight Sex Delivery Anes PTL Lv  2 Term 10/27/15 [redacted]w[redacted]d  7 lb 11.1 oz (3.49 kg) F CS-LTranv Spinal  LIV  1 Term 04/19/13 [redacted]w[redacted]d  7 lb 4 oz (3.289 kg) F CS-LTranv Spinal  LIV    Obstetric Comments  LTCS: small statue, CPD.     Current Medications Current Outpatient Medications on File Prior to Visit  Medication Sig Dispense Refill  . albuterol (PROVENTIL HFA;VENTOLIN HFA) 108 (90 Base) MCG/ACT inhaler Inhale 2 puffs into the lungs every 6 (six) hours as needed for wheezing or shortness of breath.    . cetirizine (ZYRTEC) 10 MG tablet Take 10 mg by mouth daily.    .  citalopram (CELEXA) 20 MG tablet Take 1 tablet (20 mg total) by mouth daily. 30 tablet 12  . etonogestrel-ethinyl estradiol (NUVARING) 0.12-0.015 MG/24HR vaginal ring INSERT VAGINALLY AND LEAVE IN PLACE FOR 3 CONSECUTIVE WEEKS, THEN REMOVE FOR 1 WEEK. 1 each 12  . famotidine (PEPCID) 20 MG tablet Take 20 mg by mouth daily.    . fluticasone (FLONASE) 50 MCG/ACT nasal spray Place 1 spray into the nose as needed.    Marland Kitchen L-Theanine 100 MG CAPS Take 2 capsules by mouth daily.    Marland Kitchen levothyroxine (SYNTHROID) 75 MCG tablet TAKE 1 TABLET BY MOUTH DAILY BEFORE BREAKFAST. 30 tablet 4  . meloxicam (MOBIC) 15 MG tablet Take 15 mg by mouth daily.     No current facility-administered medications on file prior to visit.     Review of Systems Patient denies any headaches, blurred vision, shortness of breath, chest pain, abdominal pain, problems with bowel movements, urination, or intercourse.  Objective:  BP 109/74   Pulse 69   Ht 4\' 11"  (1.499 m)   Wt 165 lb 3.2 oz (74.9 kg)   LMP 03/21/2019 (Exact Date)   BMI 33.37 kg/m  Physical Exam  General:  Well developed, well nourished, no acute distress. She is alert and oriented x3. Skin:  Warm and dry Neck:  Midline trachea, no thyromegaly or nodules Cardiovascular: Regular rate and rhythm, no murmur heard Lungs:  Effort normal, all lung fields clear to auscultation bilaterally Breasts:  No dominant palpable mass, retraction, or nipple discharge Abdomen:  Soft, non tender, no hepatosplenomegaly or masses Pelvic:  External genitalia is normal in appearance.  The vagina is normal in appearance. The cervix is bulbous, no CMT.  Thin prep pap is not done . Uterus is felt to be normal size, shape, and contour.  No adnexal masses or tenderness noted. Extremities:  No swelling or varicosities noted Psych:  She has a normal mood and affect  Assessment:   Healthy well-woman exam BMI 33 menorrhagia  Plan:  Labs obtained- will follow up accordingly Will let me  know if heavy menses occurs again and will have her come in for ultrasound due to h/o PCOS and cysts. F/U 1 year for AE, or sooner if needed Flu vaccine already given at work  [employer], CNM

## 2019-03-27 LAB — FERRITIN: Ferritin: 70 ng/mL (ref 15–150)

## 2019-03-27 LAB — COMPREHENSIVE METABOLIC PANEL
ALT: 27 IU/L (ref 0–32)
AST: 18 IU/L (ref 0–40)
Albumin/Globulin Ratio: 1.7 (ref 1.2–2.2)
Albumin: 4.6 g/dL (ref 3.8–4.8)
Alkaline Phosphatase: 63 IU/L (ref 39–117)
BUN/Creatinine Ratio: 16 (ref 9–23)
BUN: 12 mg/dL (ref 6–20)
Bilirubin Total: 0.7 mg/dL (ref 0.0–1.2)
CO2: 21 mmol/L (ref 20–29)
Calcium: 9.5 mg/dL (ref 8.7–10.2)
Chloride: 102 mmol/L (ref 96–106)
Creatinine, Ser: 0.75 mg/dL (ref 0.57–1.00)
GFR calc Af Amer: 119 mL/min/{1.73_m2} (ref >59)
GFR calc non Af Amer: 103 mL/min/{1.73_m2} (ref >59)
Globulin, Total: 2.7 g/dL (ref 1.5–4.5)
Glucose: 90 mg/dL (ref 65–99)
Potassium: 4.4 mmol/L (ref 3.5–5.2)
Sodium: 138 mmol/L (ref 134–144)
Total Protein: 7.3 g/dL (ref 6.0–8.5)

## 2019-03-27 LAB — CBC
Hematocrit: 39.8 % (ref 34.0–46.6)
Hemoglobin: 13.7 g/dL (ref 11.1–15.9)
MCH: 30.2 pg (ref 26.6–33.0)
MCHC: 34.4 g/dL (ref 31.5–35.7)
MCV: 88 fL (ref 79–97)
Platelets: 303 10*3/uL (ref 150–450)
RBC: 4.54 x10E6/uL (ref 3.77–5.28)
RDW: 12.3 % (ref 11.7–15.4)
WBC: 7.6 10*3/uL (ref 3.4–10.8)

## 2019-03-27 LAB — LIPID PANEL
Chol/HDL Ratio: 4.2 ratio (ref 0.0–4.4)
Cholesterol, Total: 259 mg/dL — ABNORMAL HIGH (ref 100–199)
HDL: 62 mg/dL (ref >39)
LDL Chol Calc (NIH): 178 mg/dL — ABNORMAL HIGH (ref 0–99)
Triglycerides: 110 mg/dL (ref 0–149)
VLDL Cholesterol Cal: 19 mg/dL (ref 5–40)

## 2019-03-27 LAB — HEMOGLOBIN A1C
Est. average glucose Bld gHb Est-mCnc: 108 mg/dL
Hgb A1c MFr Bld: 5.4 % (ref 4.8–5.6)

## 2019-03-27 LAB — THYROID PANEL WITH TSH
Free Thyroxine Index: 2.6 (ref 1.2–4.9)
T3 Uptake Ratio: 25 % (ref 24–39)
T4, Total: 10.5 ug/dL (ref 4.5–12.0)
TSH: 0.858 u[IU]/mL (ref 0.450–4.500)

## 2019-05-13 ENCOUNTER — Other Ambulatory Visit: Payer: Self-pay

## 2019-05-13 MED ORDER — CITALOPRAM HYDROBROMIDE 20 MG PO TABS: 20.0000 mg | ORAL_TABLET | Freq: Every day | ORAL | 1 refills | Status: DC

## 2019-05-13 NOTE — Telephone Encounter (Signed)
Per [facility] pharmacy- 90 day supply with 1 refill on celexa sent to pharmacy

## 2019-07-08 ENCOUNTER — Ambulatory Visit: Admit: 2019-07-08 | Discharge: 2019-07-08 | Payer: PRIVATE HEALTH INSURANCE | Attending: Specialist

## 2019-07-08 DIAGNOSIS — Z01419 Encounter for gynecological examination (general) (routine) without abnormal findings: Secondary | ICD-10-CM

## 2019-07-08 MED ORDER — METRONIDAZOLE 500 MG PO TABS
500 MG | ORAL_TABLET | Freq: Two times a day (BID) | ORAL | 0 refills | Status: AC
Start: 2019-07-08 — End: 2019-07-15

## 2019-07-08 MED ORDER — FLUCONAZOLE 100 MG PO TABS
100 MG | ORAL_TABLET | Freq: Every day | ORAL | 0 refills | Status: AC
Start: 2019-07-08 — End: 2019-07-15

## 2019-09-10 ENCOUNTER — Encounter

## 2019-09-24 ENCOUNTER — Other Ambulatory Visit: Payer: No Typology Code available for payment source

## 2019-10-03 ENCOUNTER — Other Ambulatory Visit: Payer: Self-pay

## 2019-10-03 ENCOUNTER — Ambulatory Visit (INDEPENDENT_AMBULATORY_CARE_PROVIDER_SITE_OTHER): Payer: No Typology Code available for payment source | Admitting: Gastroenterology

## 2019-10-03 ENCOUNTER — Encounter: Payer: Self-pay | Admitting: Gastroenterology

## 2019-10-03 VITALS — BP 128/85 | HR 64 | Temp 98.4°F | Ht 59.0 in | Wt 163.2 lb

## 2019-10-03 DIAGNOSIS — K600 Acute anal fissure: Secondary | ICD-10-CM | POA: Diagnosis not present

## 2019-10-03 DIAGNOSIS — K644 Residual hemorrhoidal skin tags: Secondary | ICD-10-CM

## 2019-10-03 MED ORDER — HYDROCORTISONE (PERIANAL) 2.5 % EX CREA: 1.0000 "application " | TOPICAL_CREAM | Freq: Two times a day (BID) | CUTANEOUS | 1 refills | Status: DC

## 2019-10-03 NOTE — Progress Notes (Signed)
Arlyss Repress, MD [address]  [address]  [place], [place] [address]  Main: [phone]  Fax: [phone]    Gastroenterology Consultation  Referring Provider:     Nira Retort Primary Care Physician:  Nira Retort Primary Gastroenterologist:  Dr. Arlyss Repress Reason for Consultation:     Rectal pain        HPI:   Paula [name] is a 39 y.o. female referred by Dr. Nira Retort  for consultation & management of rectal pain.  Patient made an urgent visit today due to acute onset of rectal pain that started this morning after she had a bowel movement.  She has been participating in boot camp, with dietary modification, but last few days, she reports having soft bowel movements.  She noticed bright red blood per rectum about 2 days ago.  This morning the pain was sudden, acute and severe, sharp, ripping type.  She tried over-the-counter hemorrhoid cream which did not provide much relief.  She started doing sitz bath since yesterday, which provides some relief.  Therefore, she called office to make an urgent visit  She denies any other GI symptoms.  She denies constipation in the past.  She denies prior bleeding episodes  NSAIDs: None  Antiplts/Anticoagulants/Anti thrombotics: None  GI Procedures: None  Past Medical History:  Diagnosis Date  . Anemia   . Asthma    exercise induced  . BULIMIA 01/17/2008   Annotation: AGE 23 Qualifier: Diagnosis of  By: Lorrene Reid LPN, Burna Mortimer    . Complication of anesthesia    vomited during last c/s  . 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 Cystic fibrosis carrier in second trimester, antepartum 2016  . Depression   . Eczema   . GERD (gastroesophageal reflux disease)    chronic  . Headache    h/o migraines  . Hypothyroidism   . Nausea   . Oligomenorrhea 12/22/2014  . PONV (postoperative nausea and vomiting)   . Rhinitis, allergic     Past Surgical History:  Procedure Laterality Date  .  CESAREAN SECTION  04/19/2013   LTCS; CPD  . CESAREAN SECTION N/A 10/27/2015   Procedure: REPEAT CESAREAN SECTION;  Surgeon: Herold Harms, MD;  Location: [facility];  Service: Obstetrics;  Laterality: N/A;  . ENDOSCOPIC TURBINATE REDUCTION Bilateral 07/26/2017   Procedure: ENDOSCOPIC INFERIOR  TURBINATE REDUCTION;  Surgeon: Vernie Murders, MD;  Location: [facility];  Service: ENT;  Laterality: Bilateral;  . FRONTAL SINUS EXPLORATION Bilateral 07/26/2017   Procedure: FRONTAL SINUS EXPLORATION;  Surgeon: Vernie Murders, MD;  Location: [facility];  Service: ENT;  Laterality: Bilateral;  . IMAGE GUIDED SINUS SURGERY Bilateral 07/26/2017   Procedure: IMAGE GUIDED SINUS SURGERY;  Surgeon: Vernie Murders, MD;  Location: [facility];  Service: ENT;  Laterality: Bilateral;  GAVE DISK TO CECE 2-12  . SEPTOPLASTY WITH ETHMOIDECTOMY, AND MAXILLARY ANTROSTOMY Bilateral 07/26/2017   Procedure: SEPTOPLASTY WITH TOTAL  ETHMOIDECTOMY, AND MAXILLARY ANTROSTOMYWITH REMOVAL OF TISSUE,;  Surgeon: Vernie Murders, MD;  Location: [facility];  Service: ENT;  Laterality: Bilateral;  . TONSILLECTOMY    . WISDOM TOOTH EXTRACTION      Current Outpatient Medications:  .  albuterol (PROVENTIL HFA;VENTOLIN HFA) 108 (90 Base) MCG/ACT inhaler, Inhale 2 puffs into the lungs every 6 (six) hours as needed for wheezing or shortness of breath., Disp: , Rfl:  .  cetirizine (ZYRTEC) 10 MG tablet, Take 10 mg by mouth daily., Disp: , Rfl:  .  Cholecalciferol 25  MCG (1000 UT) tablet, Take by mouth., Disp: , Rfl:  .  citalopram (CELEXA) 20 MG tablet, Take 1 tablet (20 mg total) by mouth daily., Disp: 90 tablet, Rfl: 1 .  etonogestrel-ethinyl estradiol (NUVARING) 0.12-0.015 MG/24HR vaginal ring, INSERT VAGINALLY AND LEAVE IN PLACE FOR 3 CONSECUTIVE WEEKS, THEN REMOVE FOR 1 WEEK., Disp: 3 each, Rfl: 4 .  fluticasone (FLONASE) 50 MCG/ACT nasal spray, Place 1 spray into the nose as needed., Disp: , Rfl:  .   L-Theanine 100 MG CAPS, Take 2 capsules by mouth daily., Disp: , Rfl:  .  levothyroxine (SYNTHROID) 75 MCG tablet, Take 1 tablet (75 mcg total) by mouth daily before breakfast., Disp: 90 tablet, Rfl: 4 .  montelukast (SINGULAIR) 10 MG tablet, Take by mouth., Disp: , Rfl:  .  omeprazole (PRILOSEC) 20 MG capsule, Take 40 mg by mouth daily., Disp: , Rfl:  .  triamcinolone (NASACORT) 55 MCG/ACT AERO nasal inhaler, Place into the nose., Disp: , Rfl:  .  hydrocortisone (ANUSOL-HC) 2.5 % rectal cream, Place 1 application rectally 2 (two) times daily., Disp: 30 g, Rfl: 1    Family History  Problem Relation Age of Onset  . Endometriosis Mother   . Heart disease Maternal Grandmother        valvular problem  . Breast cancer Neg Hx   . Ovarian cancer Neg Hx   . Colon cancer Neg Hx      Social History   Tobacco Use  . Smoking status: Never Smoker  . Smokeless tobacco: Never Used  Substance Use Topics  . Alcohol use: Yes    Alcohol/week: 2.0 standard drinks    Types: 2 Standard drinks or equivalent per week  . Drug use: No    Allergies as of 10/03/2019 - Review Complete 10/03/2019  Allergen Reaction Noted  . Iodine Anaphylaxis 12/22/2014  . Shellfish allergy Hives 12/22/2014  . Contrast media [iodinated diagnostic agents]  07/22/2015  . Flagyl [metronidazole]  03/14/2016    Review of Systems:    All systems reviewed and negative except where noted in HPI.   Physical Exam:  BP 128/85 (BP Location: Left Arm, Patient Position: Sitting, Cuff Size: Normal)   Pulse 64   Temp 98.4 F (36.9 C) (Oral)   Ht 4\' 11"  (1.499 m)   Wt 163 lb 4 oz (74 kg)   BMI 32.97 kg/m  No LMP recorded.  General:   Alert,  Well-developed, well-nourished, pleasant and cooperative in NAD Head:  Normocephalic and atraumatic. Eyes:  Sclera clear, no icterus.   Conjunctiva pink. Ears:  Normal auditory acuity. Nose:  No deformity, discharge, or lesions. Mouth:  No deformity or lesions,oropharynx pink &  moist. Neck:  Supple; no masses or thyromegaly. Lungs:  Respirations even and unlabored.  Clear throughout to auscultation.   No wheezes, crackles, or rhonchi. No acute distress. Heart:  Regular rate and rhythm; no murmurs, clicks, rubs, or gallops. Abdomen:  Normal bowel sounds. Soft, non-tender and non-distended without masses, hepatosplenomegaly or hernias noted.  No guarding or rebound tenderness.   Rectal: Normal perianal exam, severe tenderness in the anterior wall of the anal canal.  Anoscopy confirmed patient identity about the anal canal, external hemorrhoids.  There was no presence of thrombosed external hemorrhoids. Msk:  Symmetrical without gross deformities. Good, equal movement & strength bilaterally. Pulses:  Normal pulses noted. Extremities:  No clubbing or edema.  No cyanosis. Neurologic:  Alert and oriented x3;  grossly normal neurologically. Skin:  Intact without significant lesions or  rashes. No jaundice. Psych:  Alert and cooperative. Normal mood and affect.  Imaging Studies: None  Assessment and Plan:   Paula [name] is a 39 y.o. female with 1 day history of sudden onset of rectal pain, rectal exam and anoscopy consistent with acute anterior anal fissure and inflamed external hemorrhoids.  There is no evidence of thrombosed external hemorrhoid.  Recommend 0.125% nitroglycerin with 5% lidocaine, instructions provided Apply Anusol cream 1-2 times daily   Follow up in 4 to 6 weeks   Arlyss Repress, MD

## 2019-10-03 NOTE — Patient Instructions (Signed)
[facility]ula [name]: [address] [place] [address] Phone number [phone]

## 2019-11-12 ENCOUNTER — Ambulatory Visit: Payer: No Typology Code available for payment source | Admitting: Gastroenterology

## 2019-12-08 ENCOUNTER — Ambulatory Visit (INDEPENDENT_AMBULATORY_CARE_PROVIDER_SITE_OTHER): Payer: No Typology Code available for payment source | Admitting: Gastroenterology

## 2019-12-08 ENCOUNTER — Other Ambulatory Visit
Admission: RE | Admit: 2019-12-08 | Discharge: 2019-12-08 | Disposition: A | Discharge status: Home / Self Care | Payer: No Typology Code available for payment source | Source: Ambulatory Visit | Attending: Gastroenterology | Admitting: Gastroenterology

## 2019-12-08 ENCOUNTER — Ambulatory Visit: Payer: No Typology Code available for payment source

## 2019-12-08 ENCOUNTER — Other Ambulatory Visit: Payer: Self-pay

## 2019-12-08 VITALS — BP 144/99 | HR 63 | Temp 98.0°F | Ht 59.0 in | Wt 159.2 lb

## 2019-12-08 DIAGNOSIS — R1032 Left lower quadrant pain: Secondary | ICD-10-CM | POA: Diagnosis not present

## 2019-12-08 DIAGNOSIS — K921 Melena: Secondary | ICD-10-CM | POA: Diagnosis not present

## 2019-12-08 DIAGNOSIS — R109 Unspecified abdominal pain: Secondary | ICD-10-CM | POA: Insufficient documentation

## 2019-12-08 DIAGNOSIS — A048 Other specified bacterial intestinal infections: Secondary | ICD-10-CM | POA: Diagnosis not present

## 2019-12-08 LAB — COMPREHENSIVE METABOLIC PANEL
ALT: 30 U/L (ref 0–44)
AST: 27 U/L (ref 15–41)
Albumin: 4.1 g/dL (ref 3.5–5.0)
Alkaline Phosphatase: 56 U/L (ref 38–126)
Anion gap: 12 (ref 5–15)
BUN: 21 mg/dL — ABNORMAL HIGH (ref 6–20)
CO2: 22 mmol/L (ref 22–32)
Calcium: 9.1 mg/dL (ref 8.9–10.3)
Chloride: 102 mmol/L (ref 98–111)
Creatinine, Ser: 0.89 mg/dL (ref 0.44–1.00)
GFR calc Af Amer: >60 mL/min (ref >60)
GFR calc non Af Amer: >60 mL/min (ref >60)
Glucose, Bld: 101 mg/dL — ABNORMAL HIGH (ref 70–99)
Potassium: 3.9 mmol/L (ref 3.5–5.1)
Sodium: 136 mmol/L (ref 135–145)
Total Bilirubin: 0.9 mg/dL (ref 0.3–1.2)
Total Protein: 7.7 g/dL (ref 6.5–8.1)

## 2019-12-08 LAB — CBC
HCT: 39.1 % (ref 36.0–46.0)
Hemoglobin: 13.6 g/dL (ref 12.0–15.0)
MCH: 30.6 pg (ref 26.0–34.0)
MCHC: 34.8 g/dL (ref 30.0–36.0)
MCV: 88.1 fL (ref 80.0–100.0)
Platelets: 267 10*3/uL (ref 150–400)
RBC: 4.44 MIL/uL (ref 3.87–5.11)
RDW: 11.9 % (ref 11.5–15.5)
WBC: 9.8 10*3/uL (ref 4.0–10.5)
nRBC: 0 % (ref 0.0–0.2)

## 2019-12-08 LAB — PREGNANCY, URINE: Preg Test, Ur: NEGATIVE

## 2019-12-08 LAB — TSH: TSH: 3.9 u[IU]/mL (ref 0.350–4.500)

## 2019-12-08 LAB — LIPASE, BLOOD: Lipase: 52 U/L — ABNORMAL HIGH (ref 11–51)

## 2019-12-08 MED ORDER — OMEPRAZOLE 40 MG PO CPDR
40.0000 mg | DELAYED_RELEASE_CAPSULE | Freq: Two times a day (BID) | ORAL | 0 refills | Status: DC
Start: 2019-12-08 — End: 2020-08-26

## 2019-12-08 NOTE — Progress Notes (Signed)
Wyline Mood MD, MRCP(U.K) [address]  [address]  [place], [place] [address]  Main: [phone]  Fax: [phone]   Gastroenterology Consultation  Referring Provider:     Self-referred  primary Care Physician:  Nira Retort Primary Gastroenterologist:  Dr. Wyline Mood  Reason for Consultation:     Melena        HPI:   Paula [name] is a 39 y.o. 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  Follows with Dr. Allegra Lai.  Recently treated for an anal fissure.  She contacted [place] today suggesting that she may be having some melena and was asked to come and see me at the office.  No recent labs last hemoglobin in October 2020 was 13.7 g.  She states that for the past few weeks she has had episodic pain in the left upper part of her abdomen.  Nonradiating.  Sharp in nature.  Worse after she eats.  It has been associated with black stools over the past few days.  This morning her bowel movement was brown.  There is also been a change in consistency of the stool consistency of guacamole.  She has taken a few doses of NSAIDs in the past 1 week but not anything on a long-term basis.  Denies any other NSAID use.  Complains of a lot of bloating and gas.  She does use some artificial sweeteners and no weight loss diet regimen.  Has been consuming some wine as well on a regular basis.  She denies she is pregnant.  Past Medical History:  Diagnosis Date  . Anemia   . Asthma    exercise induced  . BULIMIA 01/17/2008   Annotation: AGE 36 Qualifier: Diagnosis of  By: Lorrene Reid LPN, Burna Mortimer    . Complication of anesthesia    vomited during last c/s  . 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 Cystic fibrosis carrier in second trimester, antepartum 2016  . Depression   . Eczema   . GERD (gastroesophageal reflux disease)    chronic  . Headache    h/o migraines  . Hypothyroidism   . Nausea   . Oligomenorrhea 12/22/2014  . PONV (postoperative nausea and vomiting)   . Rhinitis, allergic      Past Surgical History:  Procedure Laterality Date  . CESAREAN SECTION  04/19/2013   LTCS; CPD  . CESAREAN SECTION N/A 10/27/2015   Procedure: REPEAT CESAREAN SECTION;  Surgeon: Herold Harms, MD;  Location: [facility] ORS;  Service: Obstetrics;  Laterality: N/A;  . ENDOSCOPIC TURBINATE REDUCTION Bilateral 07/26/2017   Procedure: ENDOSCOPIC INFERIOR  TURBINATE REDUCTION;  Surgeon: Vernie Murders, MD;  Location: [facility];  Service: ENT;  Laterality: Bilateral;  . FRONTAL SINUS EXPLORATION Bilateral 07/26/2017   Procedure: FRONTAL SINUS EXPLORATION;  Surgeon: Vernie Murders, MD;  Location: [facility];  Service: ENT;  Laterality: Bilateral;  . IMAGE GUIDED SINUS SURGERY Bilateral 07/26/2017   Procedure: IMAGE GUIDED SINUS SURGERY;  Surgeon: Vernie Murders, MD;  Location: [facility];  Service: ENT;  Laterality: Bilateral;  GAVE DISK TO CECE 2-12  . SEPTOPLASTY WITH ETHMOIDECTOMY, AND MAXILLARY ANTROSTOMY Bilateral 07/26/2017   Procedure: SEPTOPLASTY WITH TOTAL  ETHMOIDECTOMY, AND MAXILLARY ANTROSTOMYWITH REMOVAL OF TISSUE,;  Surgeon: Vernie Murders, MD;  Location: [facility];  Service: ENT;  Laterality: Bilateral;  . TONSILLECTOMY    . WISDOM TOOTH EXTRACTION      Prior to Admission medications   Medication Sig Start Date End Date Taking? Authorizing Provider  albuterol (  PROVENTIL HFA;VENTOLIN HFA) 108 (90 Base) MCG/ACT inhaler Inhale 2 puffs into the lungs every 6 (six) hours as needed for wheezing or shortness of breath.    [provider]  cetirizine (ZYRTEC) 10 MG tablet Take 10 mg by mouth daily.    [provider]  Cholecalciferol 25 MCG (1000 UT) tablet Take by mouth.    [provider]  citalopram (CELEXA) 20 MG tablet Take 1 tablet (20 mg total) by mouth daily. 05/13/19   Doreene Burke, CNM  etonogestrel-ethinyl estradiol (NUVARING) 0.12-0.015 MG/24HR vaginal ring INSERT VAGINALLY AND LEAVE IN PLACE FOR 3 CONSECUTIVE WEEKS,  THEN REMOVE FOR 1 WEEK. 03/26/19   Shambley, Melody N, CNM  fluticasone (FLONASE) 50 MCG/ACT nasal spray Place 1 spray into the nose as needed.    [provider]  hydrocortisone (ANUSOL-HC) 2.5 % rectal cream Place 1 application rectally 2 (two) times daily. 10/03/19   Toney Reil, MD  L-Theanine 100 MG CAPS Take 2 capsules by mouth daily.    [provider]  levothyroxine (SYNTHROID) 75 MCG tablet Take 1 tablet (75 mcg total) by mouth daily before breakfast. 03/26/19   Shambley, Melody N, CNM  montelukast (SINGULAIR) 10 MG tablet Take by mouth. 07/31/19 07/30/20  [provider]  omeprazole (PRILOSEC) 20 MG capsule Take 40 mg by mouth daily.    [provider]  triamcinolone (NASACORT) 55 MCG/ACT AERO nasal inhaler Place into the nose.    [provider]    Family History  Problem Relation Age of Onset  . Endometriosis Mother   . Heart disease Maternal Grandmother        valvular problem  . Breast cancer Neg Hx   . Ovarian cancer Neg Hx   . Colon cancer Neg Hx      Social History   Tobacco Use  . Smoking status: Never Smoker  . Smokeless tobacco: Never Used  Vaping Use  . Vaping Use: Never used  Substance Use Topics  . Alcohol use: Yes    Alcohol/week: 2.0 standard drinks    Types: 2 Standard drinks or equivalent per week  . Drug use: No    Allergies as of 12/08/2019 - Review Complete 10/03/2019  Allergen Reaction Noted  . Iodine Anaphylaxis 12/22/2014  . Shellfish allergy Hives 12/22/2014  . Contrast media [iodinated diagnostic agents]  07/22/2015  . Flagyl [metronidazole]  03/14/2016    Review of Systems:    All systems reviewed and negative except where noted in HPI.   Physical Exam:  There were no vitals taken for this visit. No LMP recorded. Psych:  Alert and cooperative.  Appears uncomfortable and in some amount of distress from the abdominal discomfort General:   Alert,  Well-developed, well-nourished, pleasant  and cooperative in NAD Head:  Normocephalic and atraumatic. Eyes:  Sclera clear, no icterus.   Conjunctiva pink. Ears:  Normal auditory acuity. Lungs:  Respirations even and unlabored.  Clear throughout to auscultation.   No wheezes, crackles, or rhonchi. No acute distress. Heart:  Regular rate and rhythm; no murmurs, clicks, rubs, or gallops. Abdomen:  Normal bowel sounds.  No bruits.  Soft, mild to moderate left-sided tenderness.  No organomegaly noguarding or rigidity bowel sounds positive.  Hepatosplenomegaly or hernias noted.  No guarding or rebound tenderness.    Neurologic:  Alert and oriented x3;  grossly normal neurologically. Psych:  Alert and cooperative. Normal mood and affect.  Imaging Studies: No results found.  Assessment and Plan:   Paula [name]  is a 39 y.o. y/o female is here to see me for abdominal pain on the left side of abdomen ongoing for at least 2 weeks.  For the last few days has been associated with black-colored stools.  Taken a dose or 2 of an NSAID.  None chronically.  She also has bloating and abdominal distention.  Differentials at this time is a gastric ulcer versus diverticulitis.  Plan 1.  Urine pregnancy test. 2.  CBC CMP, TSH [she stopped taking her thyroxine recently], H. pylori breath test 3.  CT scan of the abdomen today if urine test pregnancy test is negative 4.  If CT scan of the abdomen is negative then will consider EGD tomorrow or day after tomorrow. 5.  She is on Prilosec 40 mg once a day will increase it to 40 mg twice daily    Dr Wyline Mood MD,MRCP(U.K)

## 2019-12-09 ENCOUNTER — Encounter: Payer: Self-pay | Admitting: Gastroenterology

## 2019-12-09 ENCOUNTER — Ambulatory Visit
Admission: RE | Admit: 2019-12-09 | Discharge: 2019-12-09 | Disposition: A | Discharge status: Home / Self Care | Payer: No Typology Code available for payment source | Source: Ambulatory Visit | Attending: Gastroenterology | Admitting: Gastroenterology

## 2019-12-09 DIAGNOSIS — R1032 Left lower quadrant pain: Secondary | ICD-10-CM | POA: Diagnosis not present

## 2019-12-11 ENCOUNTER — Other Ambulatory Visit: Payer: Self-pay

## 2019-12-11 ENCOUNTER — Encounter: Payer: Self-pay | Admitting: Gastroenterology

## 2019-12-11 ENCOUNTER — Telehealth: Payer: Self-pay

## 2019-12-11 LAB — H. PYLORI BREATH TEST: H. pylori UBiT: POSITIVE — AB

## 2019-12-11 MED ORDER — AMOXICILLIN 500 MG PO CAPS
1000.0000 mg | ORAL_CAPSULE | Freq: Three times a day (TID) | ORAL | 0 refills | Status: AC
Start: 2019-12-11 — End: 2019-12-25

## 2019-12-11 MED ORDER — FLUCONAZOLE 150 MG PO TABS
150.0000 mg | ORAL_TABLET | Freq: Every day | ORAL | 0 refills | Status: DC
Start: 2019-12-11 — End: 2020-04-28

## 2019-12-11 MED ORDER — CLARITHROMYCIN 500 MG PO TABS
500.0000 mg | ORAL_TABLET | Freq: Two times a day (BID) | ORAL | 0 refills | Status: AC
Start: 2019-12-11 — End: 2019-12-25

## 2019-12-11 NOTE — Telephone Encounter (Signed)
Spoke with pt and informed her of lab result and Dr. Johnney Killian recommendation for pt to start treatment. Pt agrees and prescriptions have been sent to pharmacy.

## 2019-12-11 NOTE — Telephone Encounter (Signed)
-----   Message from Wyline Mood, MD sent at 12/11/2019  8:44 AM EDT ----- Paula [name]  Please  send in meds for H pylori   Prilosec 40 mg BID (she is already on ) Clarithromycin 500 mg BID Amoxicillin 1 gram BID    For 14 days   Check 6 weeks later off PPI for eradication

## 2020-01-06 ENCOUNTER — Ambulatory Visit: Admit: 2020-01-06 | Discharge: 2020-01-06 | Payer: PRIVATE HEALTH INSURANCE | Attending: Women's Health

## 2020-01-06 DIAGNOSIS — Z113 Encounter for screening for infections with a predominantly sexual mode of transmission: Secondary | ICD-10-CM

## 2020-01-24 ENCOUNTER — Inpatient Hospital Stay
Admit: 2020-01-24 | Discharge: 2020-01-24 | Disposition: A | Discharge status: Home / Self Care | Payer: PRIVATE HEALTH INSURANCE | Attending: Emergency Medicine

## 2020-01-24 DIAGNOSIS — J029 Acute pharyngitis, unspecified: Secondary | ICD-10-CM

## 2020-01-24 LAB — URINALYSIS WITH REFLEX TO CULTURE
Bilirubin Urine: NEGATIVE
Glucose, Ur: NEGATIVE
Ketones, Urine: NEGATIVE
Leukocyte Esterase, Urine: NEGATIVE
Nitrite, Urine: NEGATIVE
Protein, UA: NEGATIVE
Specific Gravity, UA: 1.02 (ref 1.005–1.030)
Urine Hgb: NEGATIVE
Urobilinogen, Urine: NORMAL
pH, UA: 7 (ref 5.0–8.0)

## 2020-01-24 LAB — STREP SCREEN GROUP A THROAT

## 2020-01-24 MED ORDER — ACETAMINOPHEN 325 MG PO TABS
325 MG | Freq: Once | ORAL | Status: AC
Start: 2020-01-24 — End: 2020-01-24
  Administered 2020-01-24: 10:00:00 650 mg via ORAL

## 2020-01-24 MED FILL — ACETAMINOPHEN 325 MG PO TABS: 325 mg | ORAL | Qty: 2

## 2020-01-24 NOTE — ED Provider Notes (Signed)
eMERGENCY dEPARTMENT eNCOUnter      Pt Name: Olivia Castro  MRN: [id number]  Birthdate [birth year]  Date of evaluation: 01/24/2020      CHIEF COMPLAINT       Chief Complaint   Patient presents with   ??? Pharyngitis   ??? Dysuria         HISTORY OF PRESENT ILLNESS    VERNETTA DIZDAREVIC is a 39 y.o. female who presents with sore throat and frequent urination patient states she has been having a sore throat for the last day or so states she feels like she is going to bathroom more often however she is denying any dysuria she denies any abdominal pain she states she feels like she has been urinating more frequently for the last week no cough no fevers no chest pain no shortness of breath no body aches no headache        REVIEW OF SYSTEMS       Review of systems are all reviewed and negative except stated above in HPI    PAST MEDICAL HISTORY    has no past medical history on file.    SURGICAL HISTORY      has a past surgical history that includes Ectopic pregnancy surgery.    CURRENT MEDICATIONS       Discharge Medication List as of 01/24/2020  5:52 AM      CONTINUE these medications which have NOT CHANGED    Details   Multiple Vitamins-Minerals (MULTI COMPLETE PO) 1 TabletHistorical Med             ALLERGIES     is allergic to iodine.    FAMILY HISTORY     She indicated that her mother is alive. She indicated that her father is alive. She indicated that her maternal grandmother is deceased.     family history includes Diabetes in her father and mother; High Blood Pressure in her father and mother; Hypertension in her father and mother; Lung Cancer in her maternal grandmother; Prostate Cancer in her father.    SOCIAL HISTORY      reports that she has never smoked. She has never used smokeless tobacco. She reports that she does not drink alcohol and does not use drugs.    PHYSICAL EXAM     INITIAL VITALS:  height is 5\' 4"  (1.626 m) and weight is 79.4 kg (175 lb). Her oral temperature is 100.4 ??F (38 ??C). Her blood  pressure is 130/77 and her pulse is 72. Her respiration is 16 and oxygen saturation is 98%.      General: Patient alert nontoxic-appearing female in no apparent distress  HEENT: Head is atraumatic conjunctiva are clear TMs are clear oromucosa pink and moist posterior pharynx without erythema or exudate  Neck: Supple no meningismus some minor anterior chain lymphadenopathy  Respiratory: Lung sounds are clear bilateral  Cardiac: Heart is regular rate and rhythm    DIFFERENTIAL DIAGNOSIS/ MDM:     Pharyngitis    DIAGNOSTIC RESULTS     EKG: All EKG's are interpreted by the Emergency Department Physician who either signs or Co-signs this chart in the absence of a cardiologist.        RADIOLOGY:   I directly visualized the following  images and reviewed the radiologist interpretations:  No orders to display         LABS:  Labs Reviewed   STREP SCREEN GROUP A THROAT   URINE RT REFLEX TO CULTURE  EMERGENCY DEPARTMENT COURSE:   Vitals:    Vitals:    01/24/20 0447   BP: 130/77   Pulse: 72   Resp: 16   Temp: 100.4 ??F (38 ??C)   TempSrc: Oral   SpO2: 98%   Weight: 79.4 kg (175 lb)   Height: 5\' 4"  (1.626 m)     -------------------------  BP: 130/77, Temp: 100.4 ??F (38 ??C), Pulse: 72, Resp: 16    Orders Placed This Encounter   Medications   ??? acetaminophen (TYLENOL) tablet 650 mg           Re-evaluation Notes    Strep is negative at this point there is no indications of acute bacterial infection she was asking about Covid I did inform her we are generally not a Covid testing center she is concerned she get outpatient testing however her only symptom at this time is her sore throat    CRITICAL CARE:   None      CONSULTS:      PROCEDURES:  None    FINAL IMPRESSION      1. Acute pharyngitis, unspecified etiology          DISPOSITION/PLAN   DISPOSITION Decision To Discharge 01/24/2020 05:52:39 AM      Condition on Disposition    Stable    PATIENT REFERRED TO:  No follow-up provider specified.    DISCHARGE  MEDICATIONS:  Discharge Medication List as of 01/24/2020  5:52 AM          (Please note that portions of this note were completed with a voice recognition program.  Efforts were made to edit the dictations but occasionally words are mis-transcribed.)    01/26/2020, MD,, MD, F.A.C.E.P.  Attending Emergency Physician        Jacki Cones, MD  01/24/20 [phone]

## 2020-01-24 NOTE — ED Notes (Signed)
Pt presents to ED via private auto with c/o sore throat and frequent urination. Pt states she was unable to sleep well due to her sore throat. Pt states she usually uses the restroom 3x a days but is going more frequently today. Even, non-labored breathing. Pt able to ambulate to ED room without assist. Pt slightly febrile, vitals stable.       Sherren Mocha, RN  01/24/20 [phone]

## 2020-03-22 ENCOUNTER — Other Ambulatory Visit: Payer: Self-pay | Admitting: Student

## 2020-03-25 ENCOUNTER — Other Ambulatory Visit: Payer: Self-pay

## 2020-03-25 NOTE — Telephone Encounter (Signed)
mychart message sent to patient

## 2020-03-26 ENCOUNTER — Other Ambulatory Visit: Payer: Self-pay | Admitting: Certified Nurse Midwife

## 2020-03-29 ENCOUNTER — Other Ambulatory Visit: Payer: Self-pay | Admitting: Certified Nurse Midwife

## 2020-03-29 ENCOUNTER — Telehealth: Payer: Self-pay

## 2020-03-29 NOTE — Telephone Encounter (Signed)
mychart message sent to patient- refill sent to pharmacy

## 2020-04-11 ENCOUNTER — Telehealth: Payer: No Typology Code available for payment source | Admitting: Physician Assistant

## 2020-04-11 ENCOUNTER — Encounter: Payer: Self-pay | Admitting: Physician Assistant

## 2020-04-11 DIAGNOSIS — J018 Other acute sinusitis: Secondary | ICD-10-CM | POA: Diagnosis not present

## 2020-04-11 MED ORDER — AMOXICILLIN-POT CLAVULANATE 875-125 MG PO TABS
1.0000 | ORAL_TABLET | Freq: Two times a day (BID) | ORAL | 0 refills | Status: DC
Start: 2020-04-11 — End: 2020-04-28

## 2020-04-11 NOTE — Progress Notes (Signed)
We are sorry that you are not feeling well.  Here is how we plan to help!  Based on what you have shared with me it looks like you have sinusitis.  Sinusitis is inflammation and infection in the sinus cavities of the head.  Based on your presentation I believe you most likely have Acute Bacterial Sinusitis.  This is an infection caused by bacteria and is treated with antibiotics. I have prescribed Augmentin 875mg /125mg  one tablet twice daily with food, for 7 days. You may use an oral decongestant such as Mucinex D or if you have glaucoma or high blood pressure use plain Mucinex. Saline nasal spray help and can safely be used as often as needed for congestion.  If you develop worsening sinus pain, fever or notice severe headache and vision changes, or if symptoms are not better after completion of antibiotic, please schedule an appointment with a health care provider.    If the ear issue is pressure then it is likely due to sinus infection, and prescribed antibiotic should help. If you have pain on the outside of the ear, pain with ear manipulation/movement, or swelling of the ear, please submit another Evisit for the ear pain.    Sinus infections are not as easily transmitted as other respiratory infection, however we still recommend that you avoid close contact with loved ones, especially the very young and elderly.  Remember to wash your hands thoroughly throughout the day as this is the number one way to prevent the spread of infection!  Home Care:  Only take medications as instructed by your medical team.  Complete the entire course of an antibiotic.  Do not take these medications with alcohol.  A steam or ultrasonic humidifier can help congestion.  You can place a towel over your head and breathe in the steam from hot water coming from a faucet.  Avoid close contacts especially the very young and the elderly.  Cover your mouth when you cough or sneeze.  Always remember to wash your  hands.  Get Help Right Away If:  You develop worsening fever or sinus pain.  You develop a severe head ache or visual changes.  Your symptoms persist after you have completed your treatment plan.  Make sure you  Understand these instructions.  Will watch your condition.  Will get help right away if you are not doing well or get worse.  Your e-visit answers were reviewed by a board certified advanced clinical practitioner to complete your personal care plan.  Depending on the condition, your plan could have included both over the counter or prescription medications.  If there is a problem please reply  once you have received a response from your provider.  Your safety is important to .  If you have drug allergies check your prescription carefully.    You can use MyChart to ask questions about today's visit, request a non-urgent call back, or ask for a work or school excuse for 24 hours related to this e-Visit. If it has been greater than 24 hours you will need to follow up with your provider, or enter a new e-Visit to address those concerns.  You will get an e-mail in the next two days asking about your experience.  I hope that your e-visit has been valuable and will speed your recovery. Thank you for using e-visits.   I spent 5-10 minutes on review and completion of this note- [place] [facility]

## 2020-04-21 ENCOUNTER — Encounter: Payer: No Typology Code available for payment source | Admitting: Certified Nurse Midwife

## 2020-04-27 ENCOUNTER — Other Ambulatory Visit: Payer: Self-pay | Admitting: Certified Nurse Midwife

## 2020-04-28 ENCOUNTER — Other Ambulatory Visit: Payer: Self-pay

## 2020-04-28 ENCOUNTER — Encounter: Payer: Self-pay | Admitting: Certified Nurse Midwife

## 2020-04-28 ENCOUNTER — Other Ambulatory Visit: Payer: Self-pay | Admitting: Certified Nurse Midwife

## 2020-04-28 ENCOUNTER — Ambulatory Visit (INDEPENDENT_AMBULATORY_CARE_PROVIDER_SITE_OTHER): Payer: No Typology Code available for payment source | Admitting: Certified Nurse Midwife

## 2020-04-28 VITALS — BP 129/73 | HR 62 | Ht 59.0 in | Wt 163.1 lb

## 2020-04-28 DIAGNOSIS — R519 Headache, unspecified: Secondary | ICD-10-CM

## 2020-04-28 DIAGNOSIS — G8929 Other chronic pain: Secondary | ICD-10-CM

## 2020-04-28 MED ORDER — LEVOTHYROXINE SODIUM 75 MCG PO TABS: 75.0000 ug | ORAL_TABLET | Freq: Every day | ORAL | 11 refills | Status: DC

## 2020-04-28 MED ORDER — ETONOGESTREL-ETHINYL ESTRADIOL 0.12-0.015 MG/24HR VA RING: VAGINAL_RING | VAGINAL | 4 refills | Status: DC

## 2020-04-28 MED ORDER — CITALOPRAM HYDROBROMIDE 10 MG PO TABS
30.0000 mg | ORAL_TABLET | Freq: Every day | ORAL | 0 refills | Status: DC
Start: 2020-04-28 — End: 2020-06-16

## 2020-04-28 NOTE — Progress Notes (Signed)
GYNECOLOGY ANNUAL PREVENTATIVE CARE ENCOUNTER NOTE  History:     Paula [name] is a 39 y.o. G33P2002 female here for a routine annual gynecologic exam.  Current complaints: headaches x 2 yrs on right side also had right sided numbness and tingling in her arm and hand, Has had right sided facial numbness. She is requesting a referral to neurologist.  Has had increased anxiety over the past year. Feels stressed.  Denies abnormal vaginal bleeding, discharge, pelvic pain, problems with intercourse or other gynecologic concerns.     Social Relationship: married Living:with spouse and children Work:[employer]  Exercise: boot camp, not doing as much as she would like Smoke/Alcohol/drug use: denies  Gynecologic History Patient's last menstrual period was 04/25/2020 (exact date). Contraception: NuvaRing vaginal inserts Last Pap:03/25/2017 Results were: normal with negative HPV Last mammogram: n/a .  Upstream - 04/28/20 1003      Pregnancy Intention Screening   Does the patient want to become pregnant in the next year? No    Does the patient's partner want to become pregnant in the next year? No    Would the patient like to discuss contraceptive options today? No      Contraception Wrap Up   Current Method Vaginal Ring    End Method Vaginal Ring    Contraception Counseling Provided No          The pregnancy intention screening data noted above was reviewed. Potential methods of contraception were discussed. The patient elected to proceed with Vaginal Ring.    Obstetric History OB History  Gravida Para Term Preterm AB Living  2 2 2     2   SAB TAB Ectopic Multiple Live Births        0 2    # Outcome Date GA Lbr Len/2nd Weight Sex Delivery Anes PTL Lv  2 Term 10/27/15 [redacted]w[redacted]d  7 lb 11.1 oz (3.49 kg) F CS-LTranv Spinal  LIV  1 Term 04/19/13 [redacted]w[redacted]d  7 lb 4 oz (3.289 kg) F CS-LTranv Spinal  LIV    Obstetric Comments  LTCS: small statue, CPD.     Past Medical History:  Diagnosis  Date  . Anemia   . Asthma    exercise induced  . BULIMIA 01/17/2008   Annotation: AGE 22 Qualifier: Diagnosis of  By: 01/19/2008 LPN, Lorrene Reid    . Complication of anesthesia    vomited during last c/s  . 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 Cystic fibrosis carrier in second trimester, antepartum 2016  . Depression   . Eczema   . GERD (gastroesophageal reflux disease)    chronic  . Headache    h/o migraines  . Hypothyroidism   . Nausea   . Oligomenorrhea 12/22/2014  . PONV (postoperative nausea and vomiting)   . Rhinitis, allergic     Past Surgical History:  Procedure Laterality Date  . CESAREAN SECTION  04/19/2013   LTCS; CPD  . CESAREAN SECTION N/A 10/27/2015   Procedure: REPEAT CESAREAN SECTION;  Surgeon: 10/29/2015, MD;  Location: [facility] ORS;  Service: Obstetrics;  Laterality: N/A;  . ENDOSCOPIC TURBINATE REDUCTION Bilateral 07/26/2017   Procedure: ENDOSCOPIC INFERIOR  TURBINATE REDUCTION;  Surgeon: 07/28/2017, MD;  Location: [facility];  Service: ENT;  Laterality: Bilateral;  . FRONTAL SINUS EXPLORATION Bilateral 07/26/2017   Procedure: FRONTAL SINUS EXPLORATION;  Surgeon: 07/28/2017, MD;  Location: [facility];  Service: ENT;  Laterality: Bilateral;  . IMAGE GUIDED SINUS SURGERY Bilateral 07/26/2017   Procedure: IMAGE  GUIDED SINUS SURGERY;  Surgeon: Vernie Murders, MD;  Location: [facility];  Service: ENT;  Laterality: Bilateral;  GAVE DISK TO CECE 2-12  . SEPTOPLASTY WITH ETHMOIDECTOMY, AND MAXILLARY ANTROSTOMY Bilateral 07/26/2017   Procedure: SEPTOPLASTY WITH TOTAL  ETHMOIDECTOMY, AND MAXILLARY ANTROSTOMYWITH REMOVAL OF TISSUE,;  Surgeon: Vernie Murders, MD;  Location: [facility];  Service: ENT;  Laterality: Bilateral;  . TONSILLECTOMY    . WISDOM TOOTH EXTRACTION      Current Outpatient Medications on File Prior to Visit  Medication Sig Dispense Refill  . albuterol (PROVENTIL HFA;VENTOLIN HFA) 108 (90 Base) MCG/ACT  inhaler Inhale 2 puffs into the lungs every 6 (six) hours as needed for wheezing or shortness of breath.    . cetirizine (ZYRTEC) 10 MG tablet Take 10 mg by mouth daily.    . Cholecalciferol 25 MCG (1000 UT) tablet Take by mouth.    Marland Kitchen L-Theanine 100 MG CAPS Take 2 capsules by mouth daily.    . montelukast (SINGULAIR) 10 MG tablet Take by mouth.    . triamcinolone (NASACORT) 55 MCG/ACT AERO nasal inhaler Place into the nose.    Marland Kitchen omeprazole (PRILOSEC) 40 MG capsule Take 1 capsule (40 mg total) by mouth in the morning and at bedtime. 180 capsule 0   No current facility-administered medications on file prior to visit.    Allergies  Allergen Reactions  . Iodine Anaphylaxis    Contrast   . Shellfish Allergy Hives  . Contrast Media [Iodinated Diagnostic Agents]   . Flagyl [Metronidazole]     Social History:  reports that she has never smoked. She has never used smokeless tobacco. She reports current alcohol use of about 2.0 standard drinks of alcohol per week. She reports that she does not use drugs.  Family History  Problem Relation Age of Onset  . Endometriosis Mother   . Heart disease Maternal Grandmother        valvular problem  . Breast cancer Neg Hx   . Ovarian cancer Neg Hx   . Colon cancer Neg Hx     The following portions of the patient's history were reviewed and updated as appropriate: allergies, current medications, past family history, past medical history, past social history, past surgical history and problem list.  Review of Systems Pertinent items noted in HPI and remainder of comprehensive ROS otherwise negative.  Physical Exam:  BP 129/73   Pulse 62   Ht 4\' 11"  (1.499 m)   Wt 163 lb 2 oz (74 kg)   LMP 04/25/2020 (Exact Date)   BMI 32.95 kg/m  CONSTITUTIONAL: Well-developed, well-nourished female in no acute distress.  HENT:  Normocephalic, atraumatic, External right and left ear normal. Oropharynx is clear and moist EYES: Conjunctivae and EOM are normal.  Pupils are equal, round, and reactive to light. No scleral icterus.  NECK: Normal range of motion, supple, no masses.  Normal thyroid.  SKIN: Skin is warm and dry. No rash noted. Not diaphoretic. No erythema. No pallor. MUSCULOSKELETAL: Normal range of motion. No tenderness.  No cyanosis, clubbing, or edema.  2+ distal pulses. NEUROLOGIC: Alert and oriented to person, place, and time. Normal reflexes, muscle tone coordination.  PSYCHIATRIC: Normal mood and affect. Normal behavior. Normal judgment and thought content. CARDIOVASCULAR: Normal heart rate noted, regular rhythm RESPIRATORY: Clear to auscultation bilaterally. Effort and breath sounds normal, no problems with respiration noted. BREASTS: Symmetric in size. No masses, tenderness, skin changes, nipple drainage, or lymphadenopathy bilaterally.  ABDOMEN: Soft, no distention noted.  No tenderness,  rebound or guarding.  PELVIC: Normal appearing external genitalia and urethral meatus; normal appearing vaginal mucosa and cervix.  No abnormal discharge noted.  Pap smear not indicated.  Normal uterine size, no other palpable masses, no uterine or adnexal tenderness.  .   Assessment and Plan:  Annual Well Women GYN exam  Pap:not due Mammogram :n/a  Labs: none due Refills:Dose change on Celexa, nuva ring , synthroid ordered. Referral:nerologist for headaches Routine preventative health maintenance measures emphasized. Please refer to After Visit Summary for other counseling recommendations.      Doreene Burke, CNM [facility],  [facility]

## 2020-04-28 NOTE — Patient Instructions (Signed)
Preventive Care 21-37 Years Old, Female Preventive care refers to visits with your health care provider and lifestyle choices that can promote health and wellness. This includes:  A yearly physical exam. This may also be called an annual well check.  Regular dental visits and eye exams.  Immunizations.  Screening for certain conditions.  Healthy lifestyle choices, such as eating a healthy diet, getting regular exercise, not using drugs or products that contain nicotine and tobacco, and limiting alcohol use. What can I expect for my preventive care visit? Physical exam Your health care provider will check your:  Height and weight. This may be used to calculate body mass index (BMI), which tells if you are at a healthy weight.  Heart rate and blood pressure.  Skin for abnormal spots. Counseling Your health care provider may ask you questions about your:  Alcohol, tobacco, and drug use.  Emotional well-being.  Home and relationship well-being.  Sexual activity.  Eating habits.  Work and work environment.  Method of birth control.  Menstrual cycle.  Pregnancy history. What immunizations do I need?  Influenza (flu) vaccine  This is recommended every year. Tetanus, diphtheria, and pertussis (Tdap) vaccine  You may need a Td booster every 10 years. Varicella (chickenpox) vaccine  You may need this if you have not been vaccinated. Human papillomavirus (HPV) vaccine  If recommended by your health care provider, you may need three doses over 6 months. Measles, mumps, and rubella (MMR) vaccine  You may need at least one dose of MMR. You may also need a second dose. Meningococcal conjugate (MenACWY) vaccine  One dose is recommended if you are age 19-21 years and a first-year college student living in a residence hall, or if you have one of several medical conditions. You may also need additional booster doses. Pneumococcal conjugate (PCV13) vaccine  You may need  this if you have certain conditions and were not previously vaccinated. Pneumococcal polysaccharide (PPSV23) vaccine  You may need one or two doses if you smoke cigarettes or if you have certain conditions. Hepatitis A vaccine  You may need this if you have certain conditions or if you travel or work in places where you may be exposed to hepatitis A. Hepatitis B vaccine  You may need this if you have certain conditions or if you travel or work in places where you may be exposed to hepatitis B. Haemophilus influenzae type b (Hib) vaccine  You may need this if you have certain conditions. You may receive vaccines as individual doses or as more than one vaccine together in one shot (combination vaccines). Talk with your health care provider about the risks and benefits of combination vaccines. What tests do I need?  Blood tests  Lipid and cholesterol levels. These may be checked every 5 years starting at age 20.  Hepatitis C test.  Hepatitis B test. Screening  Diabetes screening. This is done by checking your blood sugar (glucose) after you have not eaten for a while (fasting).  Sexually transmitted disease (STD) testing.  BRCA-related cancer screening. This may be done if you have a family history of breast, ovarian, tubal, or peritoneal cancers.  Pelvic exam and Pap test. This may be done every 3 years starting at age 21. Starting at age 30, this may be done every 5 years if you have a Pap test in combination with an HPV test. Talk with your health care provider about your test results, treatment options, and if necessary, the need for more tests.   Follow these instructions at home: Eating and drinking   Eat a diet that includes fresh fruits and vegetables, whole grains, lean protein, and low-fat dairy.  Take vitamin and mineral supplements as recommended by your health care provider.  Do not drink alcohol if: ? Your health care provider tells you not to drink. ? You are  pregnant, may be pregnant, or are planning to become pregnant.  If you drink alcohol: ? Limit how much you have to 0-1 drink a day. ? Be aware of how much alcohol is in your drink. In the [place] one drink equals one 12 oz bottle of beer (355 mL), one 5 oz glass of wine (148 mL), or one 1 oz glass of hard liquor (44 mL). Lifestyle  Take daily care of your teeth and gums.  Stay active. Exercise for at least 30 minutes on 5 or more days each week.  Do not use any products that contain nicotine or tobacco, such as cigarettes, e-cigarettes, and chewing tobacco. If you need help quitting, ask your health care provider.  If you are sexually active, practice safe sex. Use a condom or other form of birth control (contraception) in order to prevent pregnancy and STIs (sexually transmitted infections). If you plan to become pregnant, see your health care provider for a preconception visit. What's next?  Visit your health care provider once a year for a well check visit.  Ask your health care provider how often you should have your eyes and teeth checked.  Stay up to date on all vaccines. This information is not intended to replace advice given to you by your health care provider. Make sure you discuss any questions you have with your health care provider. Document Revised: 01/24/2018 Document Reviewed: 01/24/2018 Elsevier Patient Education  [place], Adult After being diagnosed with an anxiety disorder, you may be relieved to know why you have felt or behaved a certain way. You may also feel overwhelmed about the treatment ahead and what it will mean for your life. With care and support, you can manage this condition and recover from it. How to manage lifestyle changes Managing stress and anxiety  Stress is your body's reaction to life changes and events, both good and bad. Most stress will last just a few hours, but stress can be ongoing and can lead to more than just  stress. Although stress can play a major role in anxiety, it is not the same as anxiety. Stress is usually caused by something external, such as a deadline, test, or competition. Stress normally passes after the triggering event has ended.  Anxiety is caused by something internal, such as imagining a terrible outcome or worrying that something will go wrong that will devastate you. Anxiety often does not go away even after the triggering event is over, and it can become long-term (chronic) worry. It is important to understand the differences between stress and anxiety and to manage your stress effectively so that it does not lead to an anxious response. Talk with your health care provider or a counselor to learn more about reducing anxiety and stress. He or she may suggest tension reduction techniques, such as:  Music therapy. This can include creating or listening to music that you enjoy and that inspires you.  Mindfulness-based meditation. This involves being aware of your normal breaths while not trying to control your breathing. It can be done while sitting or walking.  Centering prayer. This involves focusing on a word, phrase,  or sacred image that means something to you and brings you peace.  Deep breathing. To do this, expand your stomach and inhale slowly through your nose. Hold your breath for 3-5 seconds. Then exhale slowly, letting your stomach muscles relax.  Self-talk. This involves identifying thought patterns that lead to anxiety reactions and changing those patterns.  Muscle relaxation. This involves tensing muscles and then relaxing them. Choose a tension reduction technique that suits your lifestyle and personality. These techniques take time and practice. Set aside 5-15 minutes a day to do them. Therapists can offer counseling and training in these techniques. The training to help with anxiety may be covered by some insurance plans. Other things you can do to manage stress and  anxiety include:  Keeping a stress/anxiety diary. This can help you learn what triggers your reaction and then learn ways to manage your response.  Thinking about how you react to certain situations. You may not be able to control everything, but you can control your response.  Making time for activities that help you relax and not feeling guilty about spending your time in this way.  Visual imagery and yoga can help you stay calm and relax.  Medicines Medicines can help ease symptoms. Medicines for anxiety include:  Anti-anxiety drugs.  Antidepressants. Medicines are often used as a primary treatment for anxiety disorder. Medicines will be prescribed by a health care provider. When used together, medicines, psychotherapy, and tension reduction techniques may be the most effective treatment. Relationships Relationships can play a big part in helping you recover. Try to spend more time connecting with trusted friends and family members. Consider going to couples counseling, taking family education classes, or going to family therapy. Therapy can help you and others better understand your condition. How to recognize changes in your anxiety Everyone responds differently to treatment for anxiety. Recovery from anxiety happens when symptoms decrease and stop interfering with your daily activities at home or work. This may mean that you will start to:  Have better concentration and focus. Worry will interfere less in your daily thinking.  Sleep better.  Be less irritable.  Have more energy.  Have improved memory. It is important to recognize when your condition is getting worse. Contact your health care provider if your symptoms interfere with home or work and you feel like your condition is not improving. Follow these instructions at home: Activity  Exercise. Most adults should do the following: ? Exercise for at least 150 minutes each week. The exercise should increase your heart rate  and make you sweat (moderate-intensity exercise). ? Strengthening exercises at least twice a week.  Get the right amount and quality of sleep. Most adults need 7-9 hours of sleep each night. Lifestyle   Eat a healthy diet that includes plenty of vegetables, fruits, whole grains, low-fat dairy products, and lean protein. Do not eat a lot of foods that are high in solid fats, added sugars, or salt.  Make choices that simplify your life.  Do not use any products that contain nicotine or tobacco, such as cigarettes, e-cigarettes, and chewing tobacco. If you need help quitting, ask your health care provider.  Avoid caffeine, alcohol, and certain over-the-counter cold medicines. These may make you feel worse. Ask your pharmacist which medicines to avoid. General instructions  Take over-the-counter and prescription medicines only as told by your health care provider.  Keep all follow-up visits as told by your health care provider. This is important. Where to find support You can  get help and support from these sources:  Self-help groups.  Online and [employer].  A trusted spiritual leader.  Couples counseling.  Family education classes.  Family therapy. Where to find more information You may find that joining a support group helps you deal with your anxiety. The following sources can help you locate counselors or support groups near you:  [place]: www.mentalhealthamerica.net  Anxiety and Depression Association of [place] (ADAA): https://www.clark.net/  National Alliance on Mental Illness (NAMI): www.nami.org Contact a health care provider if you:  Have a hard time staying focused or finishing daily tasks.  Spend many hours a day feeling worried about everyday life.  Become exhausted by worry.  Start to have headaches, feel tense, or have nausea.  Urinate more than normal.  Have diarrhea. Get help right away if you have:  A racing heart and shortness  of breath.  Thoughts of hurting yourself or others. If you ever feel like you may hurt yourself or others, or have thoughts about taking your own life, get help right away. You can go to your nearest emergency department or call:  Your local emergency services (911 in the [place]  A suicide crisis helpline, such as the [place] at 416 710 8699. This is open 24 hours a day. Summary  Taking steps to learn and use tension reduction techniques can help calm you and help prevent triggering an anxiety reaction.  When used together, medicines, psychotherapy, and tension reduction techniques may be the most effective treatment.  Family, friends, and partners can play a big part in helping you recover from an anxiety disorder. This information is not intended to replace advice given to you by your health care provider. Make sure you discuss any questions you have with your health care provider. Document Revised: 10/15/2018 Document Reviewed: 10/15/2018 Elsevier Patient Education  [place].

## 2020-06-02 ENCOUNTER — Encounter: Payer: No Typology Code available for payment source | Admitting: Certified Nurse Midwife

## 2020-06-16 ENCOUNTER — Other Ambulatory Visit: Payer: Self-pay

## 2020-06-16 ENCOUNTER — Encounter: Payer: Self-pay | Admitting: Certified Nurse Midwife

## 2020-06-16 ENCOUNTER — Ambulatory Visit (INDEPENDENT_AMBULATORY_CARE_PROVIDER_SITE_OTHER): Payer: No Typology Code available for payment source | Admitting: Certified Nurse Midwife

## 2020-06-16 ENCOUNTER — Other Ambulatory Visit: Payer: Self-pay | Admitting: Certified Nurse Midwife

## 2020-06-16 DIAGNOSIS — F419 Anxiety disorder, unspecified: Secondary | ICD-10-CM | POA: Diagnosis not present

## 2020-06-16 DIAGNOSIS — F3289 Other specified depressive episodes: Secondary | ICD-10-CM | POA: Diagnosis not present

## 2020-06-16 MED ORDER — CITALOPRAM HYDROBROMIDE 40 MG PO TABS: 40.0000 mg | ORAL_TABLET | Freq: Every day | ORAL | 3 refills | Status: DC

## 2020-06-16 NOTE — Progress Notes (Signed)
Received transferred call from Paula [name] for a televisit. DOB as identifier. Patient states she has increased the celexa from 3 tablets per day to 4. She has been out of work since early December due to COVID and her children. PhQ9= 8; GAD = 7. Call transferred to Doreene Burke CNM for completion of visit.

## 2020-06-16 NOTE — Progress Notes (Signed)
Virtual Visit via Telephone Note  I connected with Paula [name] on 06/16/20 at 11:30 AM EST by telephone and verified that I am speaking with the correct person using two identifiers.  Location: Patient: at home Provider: at office   I discussed the limitations, risks, security and privacy concerns of performing an evaluation and management service by telephone and the availability of in person appointments. I also discussed with the patient that there may be a patient responsible charge related to this service. The patient expressed understanding and agreed to proceed.   History of Present Illness: 40 yr old with history of depression and anxiety, was here for annual exam and requested to increase her celexa dose. Does increased to 30 mg daily. Tele visit today for medication check.    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 She feels like she is always thinking about the next task and that she is not giving her children the best version of herself.  Pt caution that 40 mg is maximum dose recommend and to not take more than that daily.   Assessment and Plan: Discussed continuing on medication @ 40 mg day and placing referral for behavioral health to help manage medications. Minimal change in GAD & Phq9. Pt is in  agreement to plan.   GAD 7 : Generalized Anxiety Score 06/16/2020 04/28/2020  Nervous, Anxious, on Edge 2 3  Control/stop worrying 1 1  Worry too much - different things 1 1  Trouble relaxing 1 1  Restless 0 0  Easily annoyed or irritable 2 2  Afraid - awful might happen 0 0  Total GAD 7 Score 7 8  Anxiety Difficulty Somewhat difficult Somewhat difficult   PHQ9 SCORE ONLY 06/16/2020 04/28/2020 12/03/2015  PHQ-9 Total Score 8 7 6    Orders placed for refill celexa 40 mg daily  & referral to behavior health.   Follow Up Instructions: With Mental health provider. With me prn or annual exam.    I discussed the assessment and treatment plan with the patient. The patient was provided an opportunity to ask questions and all were answered. The patient agreed with the plan and demonstrated an understanding of the instructions.   The patient was advised to call back or seek an in-person evaluation if the symptoms worsen or if the condition fails to improve as anticipated.  I provided 10  minutes of non-face-to-face time during this encounter.   , CNM

## 2020-07-12 ENCOUNTER — Ambulatory Visit: Admit: 2020-07-12 | Discharge: 2020-07-12 | Payer: PRIVATE HEALTH INSURANCE | Attending: Specialist

## 2020-07-12 DIAGNOSIS — Z01419 Encounter for gynecological examination (general) (routine) without abnormal findings: Secondary | ICD-10-CM

## 2020-07-12 MED ORDER — FLUCONAZOLE 100 MG PO TABS
100 MG | ORAL_TABLET | Freq: Every day | ORAL | 0 refills | Status: AC
Start: 2020-07-12 — End: 2020-07-19

## 2020-07-12 MED ORDER — METRONIDAZOLE 500 MG PO TABS
500 MG | ORAL_TABLET | Freq: Two times a day (BID) | ORAL | 0 refills | Status: AC
Start: 2020-07-12 — End: 2020-07-19

## 2020-07-12 NOTE — Progress Notes (Signed)
Subjective:      Patient ID: Olivia Castro is a 40 y.o. female.  Chief Complaint   Patient presents with   ??? Annual Exam     BP 130/82 (Site: Right Upper Arm, Position: Sitting, Cuff Size: Medium Adult)    Pulse 64    Ht 5' 4"  (1.626 m)    Wt 180 lb (81.6 kg)    LMP 07/06/2020 (Exact Date)    Breastfeeding No    BMI 30.90 kg/m??   Patient's last menstrual period was 07/06/2020 (exact date).    [id number]    No past medical history on file.  Current Outpatient Medications Ordered in Epic   Medication Sig Dispense Refill   ??? metroNIDAZOLE (FLAGYL) 500 MG tablet Take 1 tablet by mouth 2 times daily for 7 days 14 tablet 0   ??? fluconazole (DIFLUCAN) 100 MG tablet Take 1 tablet by mouth daily for 7 days 7 tablet 0   ??? Multiple Vitamins-Minerals (MULTI COMPLETE PO) 1 Tablet       No current Epic-ordered facility-administered medications on file.     Problem List Items Addressed This Visit     Acute vaginitis    Relevant Orders    BRCA1 and BRCA2    Pap smear, as part of routine gynecological examination - Primary    Relevant Orders    PAP SMEAR    BRCA1 and BRCA2    Family history of breast cancer    Relevant Orders    BRCA1 and BRCA2      Other Visit Diagnoses     Screening for HPV (human papillomavirus)        Relevant Orders    PAP SMEAR    BRCA1 and BRCA2        Allergies   Allergen Reactions   ??? Iodine      Orders Placed This Encounter   Procedures   ??? PAP SMEAR     Patient History:    No LMP recorded.  OBGYN Status: Having periods  Past Surgical History:  No date: ECTOPIC PREGNANCY SURGERY      Social History    Tobacco Use      Smoking status: Never Smoker      Smokeless tobacco: Never Used       Standing Status:   Future     Standing Expiration Date:   07/12/2021     Order Specific Question:   Collection Type     Answer:   Thin Prep     Order Specific Question:   Prior Abnormal Pap Test     Answer:   No     Order Specific Question:   Screening or Diagnostic     Answer:   Screening     Order Specific Question:    HPV Requested?     Answer:   Yes     Order Specific Question:   High Risk Patient     Answer:   N/A   ??? BRCA1 and BRCA2     Standing Status:   Future     Standing Expiration Date:   07/12/2021        Patient here for her annual exam.  Her last pap smear was in February 2021 and negative for intraepithelial lesion or malignancy and negative for HPV.  Patient is aware that screening is not due for another 2 years, however she requests a pap smear be done today.  She does not do monthly self-breast exams.  Patient states  that her paternal aunt has breast cancer is undergoing treatment.  Patient believes that her aunt is in her 19s.  She also states that her sister had breast cancer screening testing and was told to make sure that her grandchildren were also tested.  She denies any gynecological complaints at this time.  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      Review of Systems   Constitutional: Negative for activity change, appetite change and fever.   HENT: Negative for ear discharge and ear pain.    Eyes: Negative for pain and visual disturbance.   Respiratory: Negative for apnea and cough.    Cardiovascular: Negative for chest pain, palpitations and leg swelling.   Gastrointestinal: Negative for abdominal distention, abdominal pain, constipation, diarrhea, nausea and vomiting.   Endocrine: Negative.    Genitourinary: Negative for difficulty urinating, dysuria, menstrual problem and pelvic pain.   Musculoskeletal: Negative for neck pain and neck stiffness.   Skin: Negative.    Neurological: Negative for light-headedness and numbness.   Hematological: Negative.  Does not bruise/bleed easily.       Objective:   Physical Exam  Vitals and nursing note reviewed. Exam conducted with a chaperone present.   Constitutional:       Appearance: She is well-developed.   HENT:      Head: Normocephalic and atraumatic.   Neck:      Thyroid: No thyroid mass or thyromegaly.   Cardiovascular:      Rate and Rhythm: Normal rate and  regular rhythm.   Pulmonary:      Effort: Pulmonary effort is normal.      Breath sounds: Normal breath sounds.   Chest:   Breasts:      Right: Normal. No inverted nipple, mass, nipple discharge, skin change or tenderness.      Left: Normal. No inverted nipple, mass, nipple discharge, skin change or tenderness.       Abdominal:      General: Bowel sounds are normal. There is no distension.      Palpations: Abdomen is soft. There is no mass.      Tenderness: There is no abdominal tenderness. There is no guarding or rebound.      Hernia: There is no hernia in the left inguinal area.   Genitourinary:     General: Normal vulva.      Exam position: Supine.      Labia:         Right: No rash or lesion.         Left: No rash or lesion.       Vagina: No signs of injury. Vaginal discharge present. No tenderness.      Cervix: No cervical motion tenderness or discharge.      Uterus: Normal. Not enlarged, not fixed and not tender.       Adnexa: Right adnexa normal and left adnexa normal.        Right: No mass or tenderness.          Left: No mass or tenderness.        Rectum: Normal. No mass or anal fissure. Normal anal tone.   Musculoskeletal:         General: No tenderness. Normal range of motion.   Skin:     General: Skin is warm and dry.   Neurological:      Mental Status: She is alert and oriented to person, place, and time.   Psychiatric:  Judgment: Judgment normal.         Assessment:      Patient with vaginitis, otherwise normal annual exam.  Pap smear was done per patient request.  Will treat with Flagyl and Diflucan. Encouraged patient to perform monthly self breast awareness exams.  Patient with family history of breast cancer.  Will evaluate with BRCA testing.       Plan:      Orders Placed This Encounter   Procedures   ??? PAP SMEAR   ??? BRCA1 and BRCA2     Orders Placed This Encounter   Medications   ??? metroNIDAZOLE (FLAGYL) 500 MG tablet     Sig: Take 1 tablet by mouth 2 times daily for 7 days     Dispense:   14 tablet     Refill:  0   ??? fluconazole (DIFLUCAN) 100 MG tablet     Sig: Take 1 tablet by mouth daily for 7 days     Dispense:  7 tablet     Refill:  0      Appointment in 1 year.    Collier Salina, am scribing for, and in the presence of Dr. Roxanna Mew.   Electronically signed by: Karma Ganja 07/12/20 10:28 AM       I agree to the above documentation placed by my scribe Karma Ganja. I reviewed the scribe's note and agree with the documented findings and plan of care. Any areas of disagreement are noted on the chart.   I have personally evaluated this patient. Additional findings are as noted. I agree with the chief complaint, past medical history, past surgical history, allergies, medications, social and family history as documented unless otherwise noted below.     Electronically signed by Roxanna Mew, MD on 07/12/2020 at 9:22 PM

## 2020-07-15 NOTE — Telephone Encounter (Signed)
Additional documentation faxed to [facility] per request. Unaffected letter, letter of medical necessity, and clinical notes faxed to [facility]. Confirmation page received.

## 2020-07-19 ENCOUNTER — Encounter
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
## 2020-07-28 ENCOUNTER — Encounter

## 2020-07-28 ENCOUNTER — Other Ambulatory Visit: Payer: Self-pay

## 2020-07-28 ENCOUNTER — Other Ambulatory Visit: Payer: Self-pay | Admitting: Psychiatry

## 2020-07-28 ENCOUNTER — Telehealth (INDEPENDENT_AMBULATORY_CARE_PROVIDER_SITE_OTHER): Payer: No Typology Code available for payment source | Admitting: Psychiatry

## 2020-07-28 ENCOUNTER — Encounter: Payer: Self-pay | Admitting: Psychiatry

## 2020-07-28 DIAGNOSIS — F330 Major depressive disorder, recurrent, mild: Secondary | ICD-10-CM | POA: Diagnosis not present

## 2020-07-28 DIAGNOSIS — F4310 Post-traumatic stress disorder, unspecified: Secondary | ICD-10-CM

## 2020-07-28 DIAGNOSIS — F411 Generalized anxiety disorder: Secondary | ICD-10-CM | POA: Diagnosis not present

## 2020-07-28 MED ORDER — VENLAFAXINE HCL ER 37.5 MG PO CP24: 37.5000 mg | ORAL_CAPSULE | Freq: Every day | ORAL | 0 refills | Status: DC

## 2020-07-28 MED ORDER — VENLAFAXINE HCL ER 75 MG PO CP24: ORAL_CAPSULE | ORAL | 1 refills | Status: DC

## 2020-07-28 NOTE — Patient Instructions (Signed)
1. Decrease citalopram 20 mg daily for one week, then discontinue 2. Start Venlafaxine 37.5 mg daily for one week, then 75 mg daily  3. Referral to therapy  4. Next appointment: 4/7 at 9 AM

## 2020-08-03 ENCOUNTER — Ambulatory Visit: Admit: 2020-08-03 | Discharge: 2020-08-03 | Payer: PRIVATE HEALTH INSURANCE | Attending: Women's Health

## 2020-08-03 DIAGNOSIS — Z712 Person consulting for explanation of examination or test findings: Secondary | ICD-10-CM

## 2020-08-03 NOTE — Progress Notes (Signed)
Subjective:      Patient ID:  Olivia Castro is a 40 y.o. female who presents for   Chief Complaint   Patient presents with   ??? Results       HPI     Patient is a 40 yo female who presents for BRCA results.  Patient has family history of multiple cancers from lung, to prostate, and breast.      Review of Systems   Constitutional: Negative for chills and fever.   HENT: Negative.    Eyes: Negative.    Respiratory: Negative.    Cardiovascular: Negative.    Gastrointestinal: Negative.    Endocrine: Negative.    Genitourinary: Negative for dysuria, menstrual problem and vaginal bleeding.   Musculoskeletal: Negative.    Skin: Negative.    Allergic/Immunologic: Negative.    Neurological: Negative.    Hematological: Negative.    Psychiatric/Behavioral: Negative.      BP 114/68    Pulse 86    Ht 5' 4"  (1.626 m)    Wt 179 lb (81.2 kg)    LMP 07/06/2020 (Exact Date)    BMI 30.73 kg/m??    Patient's last menstrual period was 07/06/2020 (exact date).    Family History   Problem Relation Age of Onset   ??? Lung Cancer Maternal Grandmother    ??? Diabetes Father    ??? Hypertension Father    ??? High Blood Pressure Father    ??? Prostate Cancer Father    ??? Diabetes Mother    ??? Hypertension Mother    ??? High Blood Pressure Mother    ??? Breast Cancer Paternal Aunt       History reviewed. No pertinent past medical history.   Past Surgical History:   Procedure Laterality Date   ??? ECTOPIC PREGNANCY SURGERY        Social History     Socioeconomic History   ??? Marital status: Single     Spouse name: None   ??? Number of children: None   ??? Years of education: None   ??? Highest education level: None   Occupational History   ??? None   Tobacco Use   ??? Smoking status: Never Smoker   ??? Smokeless tobacco: Never Used   Substance and Sexual Activity   ??? Alcohol use: No   ??? Drug use: No   ??? Sexual activity: Never   Other Topics Concern   ??? None   Social History Narrative   ??? None     Social Determinants of Health     Financial Resource Strain: Low Risk    ???  Difficulty of Paying Living Expenses: Not hard at all   Food Insecurity: No Food Insecurity   ??? Worried About Charity fundraiser in the Last Year: Never true   ??? Ran Out of Food in the Last Year: Never true   Transportation Needs:    ??? Lack of Transportation (Medical): Not on file   ??? Lack of Transportation (Non-Medical): Not on file   Physical Activity:    ??? Days of Exercise per Week: Not on file   ??? Minutes of Exercise per Session: Not on file   Stress:    ??? Feeling of Stress : Not on file   Social Connections:    ??? Frequency of Communication with Friends and Family: Not on file   ??? Frequency of Social Gatherings with Friends and Family: Not on file   ??? Attends Religious Services: Not on file   ???  Active Member of Clubs or Organizations: Not on file   ??? Attends Archivist Meetings: Not on file   ??? Marital Status: Not on file   Intimate Partner Violence:    ??? Fear of Current or Ex-Partner: Not on file   ??? Emotionally Abused: Not on file   ??? Physically Abused: Not on file   ??? Sexually Abused: Not on file   Housing Stability:    ??? Unable to Pay for Housing in the Last Year: Not on file   ??? Number of Places Lived in the Last Year: Not on file   ??? Unstable Housing in the Last Year: Not on file      Current Outpatient Medications   Medication Sig Dispense Refill   ??? Multiple Vitamins-Minerals (MULTI COMPLETE PO) 1 Tablet       No current facility-administered medications for this visit.        Objective:   Physical Exam  Vitals reviewed.   Constitutional:       Appearance: She is well-developed.   HENT:      Head: Normocephalic and atraumatic.   Cardiovascular:      Rate and Rhythm: Normal rate and regular rhythm.   Pulmonary:      Effort: Pulmonary effort is normal.      Breath sounds: Normal breath sounds.   Musculoskeletal:         General: Normal range of motion.      Cervical back: Normal range of motion and neck supple.   Skin:     General: Skin is warm and dry.   Neurological:      Mental Status: She is  oriented to person, place, and time.   Psychiatric:         Behavior: Behavior normal.         Thought Content: Thought content normal.         Judgment: Judgment normal.         Assessment:         Diagnosis Orders   1. Encounter to discuss test results          Plan:    Reviewed BRCA test results and her 2 gene variants noted and patient verbalized understanding.    Return for as needed.    Patient was seen with total face to face time of 20 minutes. More than 50% of this visit was on counseling andeducation regarding the problems listed below and her options. She was also counseled on her preventative health maintenance recommendations and follow-up.    Electronically signed by: Ames Dura, CNP

## 2020-08-26 ENCOUNTER — Encounter: Payer: Self-pay | Admitting: Gastroenterology

## 2020-08-26 ENCOUNTER — Other Ambulatory Visit: Payer: Self-pay

## 2020-08-26 ENCOUNTER — Ambulatory Visit (INDEPENDENT_AMBULATORY_CARE_PROVIDER_SITE_OTHER): Payer: No Typology Code available for payment source | Admitting: Gastroenterology

## 2020-08-26 ENCOUNTER — Other Ambulatory Visit: Payer: Self-pay | Admitting: Gastroenterology

## 2020-08-26 VITALS — BP 148/109 | HR 77 | Ht 59.0 in | Wt 172.0 lb

## 2020-08-26 DIAGNOSIS — Z8619 Personal history of other infectious and parasitic diseases: Secondary | ICD-10-CM | POA: Diagnosis not present

## 2020-08-26 DIAGNOSIS — Z8249 Family history of ischemic heart disease and other diseases of the circulatory system: Secondary | ICD-10-CM

## 2020-08-26 DIAGNOSIS — R079 Chest pain, unspecified: Secondary | ICD-10-CM | POA: Diagnosis not present

## 2020-08-26 DIAGNOSIS — Z6834 Body mass index (BMI) 34.0-34.9, adult: Secondary | ICD-10-CM

## 2020-08-26 DIAGNOSIS — E669 Obesity, unspecified: Secondary | ICD-10-CM | POA: Diagnosis not present

## 2020-08-26 MED ORDER — FAMOTIDINE 40 MG PO TABS: 40.0000 mg | ORAL_TABLET | Freq: Every day | ORAL | 1 refills | Status: DC

## 2020-08-26 MED ORDER — OMEPRAZOLE 40 MG PO CPDR
40.0000 mg | DELAYED_RELEASE_CAPSULE | Freq: Two times a day (BID) | ORAL | 1 refills | Status: DC
Start: 2020-08-26 — End: 2020-08-26

## 2020-08-26 NOTE — Patient Instructions (Signed)
Gastroesophageal Reflux Disease, Adult  Gastroesophageal reflux (GER) happens when acid from the stomach flows up into the tube that connects the mouth and the stomach (esophagus). Normally, food travels down the esophagus and stays in the stomach to be digested. With GER, food and stomach acid sometimes move back up into the esophagus. You may have a disease called gastroesophageal reflux disease (GERD) if the reflux:  Happens often.  Causes frequent or very bad symptoms.  Causes problems such as damage to the esophagus. When this happens, the esophagus becomes sore and swollen. Over time, GERD can make small holes (ulcers) in the lining of the esophagus. What are the causes? This condition is caused by a problem with the muscle between the esophagus and the stomach. When this muscle is weak or not normal, it does not close properly to keep food and acid from coming back up from the stomach. The muscle can be weak because of:  Tobacco use.  Pregnancy.  Having a certain type of hernia (hiatal hernia).  Alcohol use.  Certain foods and drinks, such as coffee, chocolate, onions, and peppermint. What increases the risk?  Being overweight.  Having a disease that affects your connective tissue.  Taking NSAIDs, such a ibuprofen. What are the signs or symptoms?  Heartburn.  Difficult or painful swallowing.  The feeling of having a lump in the throat.  A bitter taste in the mouth.  Bad breath.  Having a lot of saliva.  Having an upset or bloated stomach.  Burping.  Chest pain. Different conditions can cause chest pain. Make sure you see your doctor if you have chest pain.  Shortness of breath or wheezing.  A long-term cough or a cough at night.  Wearing away of the surface of teeth (tooth enamel).  Weight loss. How is this treated?  Making changes to your diet.  Taking medicine.  Having surgery. Treatment will depend on how bad your symptoms are. Follow these  instructions at home: Eating and drinking  Follow a diet as told by your doctor. You may need to avoid foods and drinks such as: ? Coffee and tea, with or without caffeine. ? Drinks that contain alcohol. ? Energy drinks and sports drinks. ? Bubbly (carbonated) drinks or sodas. ? Chocolate and cocoa. ? Peppermint and mint flavorings. ? Garlic and onions. ? Horseradish. ? Spicy and acidic foods. These include peppers, chili powder, curry powder, vinegar, hot sauces, and BBQ sauce. ? Citrus fruit juices and citrus fruits, such as oranges, lemons, and limes. ? Tomato-based foods. These include red sauce, chili, salsa, and pizza with red sauce. ? Fried and fatty foods. These include donuts, french fries, potato chips, and high-fat dressings. ? High-fat meats. These include hot dogs, rib eye steak, sausage, ham, and bacon. ? High-fat dairy items, such as whole milk, butter, and cream cheese.  Eat small meals often. Avoid eating large meals.  Avoid drinking large amounts of liquid with your meals.  Avoid eating meals during the 2-3 hours before bedtime.  Avoid lying down right after you eat.  Do not exercise right after you eat.   Lifestyle  Do not smoke or use any products that contain nicotine or tobacco. If you need help quitting, ask your doctor.  Try to lower your stress. If you need help doing this, ask your doctor.  If you are overweight, lose an amount of weight that is healthy for you. Ask your doctor about a safe weight loss goal.   General instructions    Pay attention to any changes in your symptoms.  Take over-the-counter and prescription medicines only as told by your doctor.  Do not take aspirin, ibuprofen, or other NSAIDs unless your doctor says it is okay.  Wear loose clothes. Do not wear anything tight around your waist.  Raise (elevate) the head of your bed about 6 inches (15 cm). You may need to use a wedge to do this.  Avoid bending over if this makes your  symptoms worse.  Keep all follow-up visits. Contact a doctor if:  You have new symptoms.  You lose weight and you do not know why.  You have trouble swallowing or it hurts to swallow.  You have wheezing or a cough that keeps happening.  You have a hoarse voice.  Your symptoms do not get better with treatment. Get help right away if:  You have sudden pain in your arms, neck, jaw, teeth, or back.  You suddenly feel sweaty, dizzy, or light-headed.  You have chest pain or shortness of breath.  You vomit and the vomit is green, yellow, or black, or it looks like blood or coffee grounds.  You faint.  Your poop (stool) is red, bloody, or black.  You cannot swallow, drink, or eat. These symptoms may represent a serious problem that is an emergency. Do not wait to see if the symptoms will go away. Get medical help right away. Call your local emergency services (911 in the [place] Do not drive yourself to the hospital. Summary  If a person has gastroesophageal reflux disease (GERD), food and stomach acid move back up into the esophagus and cause symptoms or problems such as damage to the esophagus.  Treatment will depend on how bad your symptoms are.  Follow a diet as told by your doctor.  Take all medicines only as told by your doctor. This information is not intended to replace advice given to you by your health care provider. Make sure you discuss any questions you have with your health care provider. Document Revised: 11/24/2019 Document Reviewed: 11/24/2019 Elsevier Patient Education  2021 Elsevier Inc.  

## 2020-08-26 NOTE — Progress Notes (Signed)
Wyline Mood MD, MRCP(U.K) [address]  [address]  [place], [place] [address]  Main: [phone]  Fax: [phone]   Primary Care Physician: Nira Retort  Primary Gastroenterologist:  Dr. Wyline Mood   She is here today to see me for tightening of the esophagus  HPI: Paula [name] is a 40 y.o. female    Summary of history :  Paula [name] is a 40 y.o. 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    She has had an anal fissure in the past.  Previously seen in July 2021 for dyspepsia, bloating, gas.  At that time was using artificial sweeteners with a weight loss program.  Subsequently was treated for H. pylori with complete resolution of the symptoms.    Interval history July 2021-08/26/2020  Today she is here to see me for tightening of the esophagus She states that 2 weeks back, all of a sudden she developed sudden tightness in the upper part of her chest radiating to her back.  After for a while.  She took an extra dose of Prilosec but it did not help with it right away.  She had similar episode a few days after.  It has happened in the past.  She has had similar episodes while exercising in the past as well.  She has been compliant with her Prilosec.  She has a family history of heart problems.  She suffers from high cholesterol and a high BMI.  Blood pressure today was also elevated.   Presently has no chest discomfort or abdominal discomfort. Current Outpatient Medications  Medication Sig Dispense Refill  . albuterol (PROVENTIL HFA;VENTOLIN HFA) 108 (90 Base) MCG/ACT inhaler Inhale 2 puffs into the lungs every 6 (six) hours as needed for wheezing or shortness of breath.    . cetirizine (ZYRTEC) 10 MG tablet Take 10 mg by mouth daily.    . citalopram (CELEXA) 40 MG tablet Take 1 tablet (40 mg total) by mouth daily. 30 tablet 3  . etonogestrel-ethinyl estradiol (NUVARING) 0.12-0.015 MG/24HR vaginal ring INSERT VAGINALLY AND LEAVE IN PLACE FOR 3 CONSECUTIVE  WEEKS, THEN REMOVE FOR 1 WEEK. 3 each 4  . L-Theanine 100 MG CAPS Take 2 capsules by mouth daily.    Marland Kitchen levothyroxine (SYNTHROID) 75 MCG tablet Take 1 tablet (75 mcg total) by mouth daily before breakfast. 30 tablet 11  . montelukast (SINGULAIR) 10 MG tablet Take by mouth.    Marland Kitchen omeprazole (PRILOSEC) 40 MG capsule Take 1 capsule (40 mg total) by mouth in the morning and at bedtime. 180 capsule 0  . triamcinolone (NASACORT) 55 MCG/ACT AERO nasal inhaler Place into the nose.    . venlafaxine XR (EFFEXOR-XR) 37.5 MG 24 hr capsule Take 1 capsule (37.5 mg total) by mouth daily with breakfast. 7 capsule 0  . venlafaxine XR (EFFEXOR-XR) 75 MG 24 hr capsule 75 mg daily. Start after completing 37.5 mg daily for one week 30 capsule 1   No current facility-administered medications for this visit.    Allergies as of 08/26/2020 - Review Complete 07/28/2020  Allergen Reaction Noted  . Iodine Anaphylaxis 12/22/2014  . Shellfish allergy Hives 12/22/2014  . Contrast media [iodinated diagnostic agents]  07/22/2015  . Flagyl [metronidazole]  03/14/2016    ROS:  General: Negative for anorexia, weight loss, fever, chills, fatigue, weakness. ENT: Negative for hoarseness, difficulty swallowing , nasal congestion. CV: Negative for chest pain, angina, palpitations, dyspnea on exertion, peripheral edema.  Respiratory: Negative for dyspnea at rest,  dyspnea on exertion, cough, sputum, wheezing.  GI: See history of present illness. GU:  Negative for dysuria, hematuria, urinary incontinence, urinary frequency, nocturnal urination.  Endo: Negative for unusual weight change.    Physical Examination:   There were no vitals taken for this visit.  General: Well-nourished, well-developed in no acute distress.  Eyes: No icterus. Conjunctivae pink. Neuro: Alert and oriented x 3.  Grossly intact. Skin: Warm and dry, no jaundice.   Psych: Alert and cooperative, normal mood and affect.   Imaging Studies: No results  found.  Assessment and Plan:   Paula [name] is a 40 y.o. y/o female is here today to see me for reddening of the esophagus she has a history of H. pylori causing dyspepsia which was treated and had resolution of her symptoms.  Her present symptoms of tightness in the upper part of her chest/neck is very likely related to acid reflux due to an increase in body weight which could be contributing to the same.  Despite that I would like her to be seen by a cardiologist to ensure that we are not missing angina as she does have some risk factors for that in terms of family history, BMI, elevated blood pressure, high cholesterol.   Plan 1.  H. pylori breath test to ensure H. pylori has been eradicated 2.  GERD:  Counseled on life style changes, suggest to use PPI first thing in the morning on empty stomach and eat 30 minutes after. Advised on the use of a wedge pillow at night , avoid meals for 2 hours prior to bed time. Weight loss .  Increase Prilosec to 40 mg twice daily and add Pepcid at night.  If no better after this and she complete cardiac work-up then we may need to consider endoscopy.     Dr Wyline Mood  MD,MRCP [facility]) Follow up in as needed

## 2020-08-28 ENCOUNTER — Other Ambulatory Visit: Payer: Self-pay

## 2020-08-28 LAB — H. PYLORI BREATH TEST: H pylori Breath Test: NEGATIVE

## 2020-08-28 MED FILL — Omeprazole Cap Delayed Release 40 MG: ORAL | 90 days supply | Qty: 180 | Fill #0 | Status: AC

## 2020-08-28 MED FILL — Famotidine Tab 40 MG: ORAL | 90 days supply | Qty: 90 | Fill #0 | Status: AC

## 2020-08-30 ENCOUNTER — Other Ambulatory Visit: Payer: Self-pay

## 2020-08-30 ENCOUNTER — Encounter: Payer: Self-pay | Admitting: Gastroenterology

## 2020-08-30 MED FILL — Levothyroxine Sodium Tab 75 MCG: ORAL | 30 days supply | Qty: 30 | Fill #0 | Status: AC

## 2020-08-30 MED FILL — Venlafaxine HCl Cap ER 24HR 75 MG (Base Equivalent): ORAL | 30 days supply | Qty: 30 | Fill #0 | Status: AC

## 2020-08-30 NOTE — Progress Notes (Deleted)
BH MD/PA/NP OP Progress Note  08/30/2020 8:45 AM Paula [name]  MRN:  [id number]  Chief Complaint:  HPI: *** Visit Diagnosis: No diagnosis found.  Past Psychiatric History: Please see initial evaluation for full details. I have reviewed the history. No updates at this time.     Past Medical History:  Past Medical History:  Diagnosis Date  . Anemia   . Asthma    exercise induced  . BULIMIA 01/17/2008   Annotation: AGE 40 Qualifier: Diagnosis of  By: Lorrene Reid LPN, Burna Mortimer    . Complication of anesthesia    vomited during last c/s  . 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 Cystic fibrosis carrier in second trimester, antepartum 2016  . Depression   . Eczema   . GERD (gastroesophageal reflux disease)    chronic  . Headache    h/o migraines  . Hypothyroidism   . Nausea   . Oligomenorrhea 12/22/2014  . PONV (postoperative nausea and vomiting)   . Rhinitis, allergic     Past Surgical History:  Procedure Laterality Date  . CESAREAN SECTION  04/19/2013   [facility]; CPD  . CESAREAN SECTION N/A 10/27/2015   Procedure: REPEAT CESAREAN SECTION;  Surgeon: Herold Harms, MD;  Location: [facility];  Service: Obstetrics;  Laterality: N/A;  . ENDOSCOPIC TURBINATE REDUCTION Bilateral 07/26/2017   Procedure: ENDOSCOPIC INFERIOR  TURBINATE REDUCTION;  Surgeon: Vernie Murders, MD;  Location: [facility];  Service: ENT;  Laterality: Bilateral;  . FRONTAL SINUS EXPLORATION Bilateral 07/26/2017   Procedure: FRONTAL SINUS EXPLORATION;  Surgeon: Vernie Murders, MD;  Location: [facility];  Service: ENT;  Laterality: Bilateral;  . IMAGE GUIDED SINUS SURGERY Bilateral 07/26/2017   Procedure: IMAGE GUIDED SINUS SURGERY;  Surgeon: Vernie Murders, MD;  Location: [facility];  Service: ENT;  Laterality: Bilateral;  GAVE DISK TO CECE 2-12  . SEPTOPLASTY WITH ETHMOIDECTOMY, AND MAXILLARY ANTROSTOMY Bilateral 07/26/2017   Procedure: SEPTOPLASTY WITH TOTAL  ETHMOIDECTOMY, AND  MAXILLARY ANTROSTOMYWITH REMOVAL OF TISSUE,;  Surgeon: Vernie Murders, MD;  Location: [facility];  Service: ENT;  Laterality: Bilateral;  . TONSILLECTOMY    . WISDOM TOOTH EXTRACTION      Family Psychiatric History: Please see initial evaluation for full details. I have reviewed the history. No updates at this time.     Family History:  Family History  Problem Relation Age of Onset  . Endometriosis Mother   . Bipolar disorder Mother   . Alcohol abuse Mother   . Heart disease Maternal Grandmother        valvular problem  . Depression Maternal Grandmother   . Breast cancer Neg Hx   . Ovarian cancer Neg Hx   . Colon cancer Neg Hx     Social History:  Social History   Socioeconomic History  . Marital status: Married    Spouse name: Not on file  . Number of children: Not on file  . Years of education: Not on file  . Highest education level: Not on file  Occupational History  . Occupation: ED tech    Employer: [employer]  Tobacco Use  . Smoking status: Never Smoker  . Smokeless tobacco: Never Used  Vaping Use  . Vaping Use: Never used  Substance and Sexual Activity  . Alcohol use: Yes    Alcohol/week: 2.0 standard drinks    Types: 2 Standard drinks or equivalent per week  . Drug use: No  . Sexual activity: Yes    Partners: Male    Birth  control/protection: Inserts  Other Topics Concern  . Not on file  Social History Narrative  . Not on file   Social Determinants of Health   Financial Resource Strain: Not on file  Food Insecurity: Not on file  Transportation Needs: Not on file  Physical Activity: Not on file  Stress: Not on file  Social Connections: Not on file    Allergies:  Allergies  Allergen Reactions  . Iodine Anaphylaxis    Contrast   . Shellfish Allergy Hives  . Contrast Media [Iodinated Diagnostic Agents]   . Flagyl [Metronidazole]     Metabolic Disorder Labs: Lab Results  Component Value Date   HGBA1C 5.4 03/26/2019   Lab Results   Component Value Date   PROLACTIN 35.8 (H) 03/14/2016   PROLACTIN 43.4 12/27/2007   Lab Results  Component Value Date   CHOL 259 (H) 03/26/2019   TRIG 110 03/26/2019   HDL 62 03/26/2019   CHOLHDL 4.2 03/26/2019   VLDL 17 09/25/2011   LDLCALC 178 (H) 03/26/2019   LDLCALC 154 (H) 03/20/2017   Lab Results  Component Value Date   TSH 3.900 12/08/2019   TSH 0.858 03/26/2019    Therapeutic Level Labs: No results found for: LITHIUM No results found for: VALPROATE No components found for:  CBMZ  Current Medications: Current Outpatient Medications  Medication Sig Dispense Refill  . albuterol (PROVENTIL HFA;VENTOLIN HFA) 108 (90 Base) MCG/ACT inhaler Inhale 2 puffs into the lungs every 6 (six) hours as needed for wheezing or shortness of breath.    . cetirizine (ZYRTEC) 10 MG tablet Take 10 mg by mouth daily.    . citalopram (CELEXA) 40 MG tablet TAKE 1 TABLET BY MOUTH DAILY. (Patient not taking: Reported on 08/26/2020) 30 tablet 3  . etonogestrel-ethinyl estradiol (NUVARING) 0.12-0.015 MG/24HR vaginal ring INSERT 1 RING VAGINALLY AND LEAVE IN PLACE FOR 3 CONSECUTIVE WEEKS, THEN REMOVE FOR 1 WEEK. 3 each 4  . famotidine (PEPCID) 40 MG tablet TAKE 1 TABLET (40 MG TOTAL) BY MOUTH AT BEDTIME. 90 tablet 1  . L-Theanine 100 MG CAPS Take 2 capsules by mouth daily. (Patient not taking: Reported on 08/26/2020)    . levothyroxine (SYNTHROID) 75 MCG tablet TAKE 1 TABLET BY MOUTH DAILY BEFORE BREAKFAST 30 tablet 11  . levothyroxine (SYNTHROID) 75 MCG tablet TAKE 1 TABLET BY MOUTH DAILY BEFORE BREAKFAST 30 tablet 0  . montelukast (SINGULAIR) 10 MG tablet Take by mouth.    . montelukast (SINGULAIR) 10 MG tablet TAKE 1 TABLET BY MOUTH DAILY AT NIGHT 90 tablet 1  . omeprazole (PRILOSEC) 40 MG capsule TAKE 1 CAPSULE (40 MG TOTAL) BY MOUTH IN THE MORNING AND AT BEDTIME. 180 capsule 1  . predniSONE (DELTASONE) 10 MG tablet TAKE 1 TABLET BY MOUTH ONCE DAILY FOR 10 DAYS 10 tablet 0  . triamcinolone  (NASACORT) 55 MCG/ACT AERO nasal inhaler Place into the nose. (Patient not taking: Reported on 08/26/2020)    . venlafaxine XR (EFFEXOR-XR) 37.5 MG 24 hr capsule TAKE 1 CAPSULE (37.5 MG TOTAL) BY MOUTH DAILY WITH BREAKFAST. (Patient not taking: Reported on 08/26/2020) 7 capsule 0  . venlafaxine XR (EFFEXOR-XR) 75 MG 24 hr capsule TAKE 1 CAPSULE BY MOUTH DAILY. START AFTER COMPLETING 37.5 MG DAILY FOR ONE WEEK. 30 capsule 1   No current facility-administered medications for this visit.     Musculoskeletal: Strength & Muscle Tone: N/A Gait & Station: N/A Patient leans: N/A  Psychiatric Specialty Exam: Review of Systems  currently breastfeeding.There is no height or  weight on file to calculate BMI.  General Appearance: {Appearance:22683}  Eye Contact:  {BHH EYE CONTACT:22684}  Speech:  Clear and Coherent  Volume:  Normal  Mood:  {BHH MOOD:22306}  Affect:  {Affect (PAA):22687}  Thought Process:  Coherent  Orientation:  Full (Time, Place, and Person)  Thought Content: Logical   Suicidal Thoughts:  {ST/HT (PAA):22692}  Homicidal Thoughts:  {ST/HT (PAA):22692}  Memory:  Immediate;   Good  Judgement:  {Judgement (PAA):22694}  Insight:  {Insight (PAA):22695}  Psychomotor Activity:  Normal  Concentration:  Concentration: Good and Attention Span: Good  Recall:  Good  Fund of Knowledge: Good  Language: Good  Akathisia:  No  Handed:  Right  AIMS (if indicated): not done  Assets:  Communication Skills Desire for Improvement  ADL's:  Intact  Cognition: WNL  Sleep:  {BHH GOOD/FAIR/POOR:22877}   Screenings: GAD-7   Flowsheet Row Office Visit from 06/16/2020 in [facility] Office Visit from 04/28/2020 in [facility]  Total GAD-7 Score 7 8    PHQ2-9   Flowsheet Row Video Visit from 07/28/2020 in [facility] Office Visit from 06/16/2020 in [facility] Office Visit from 04/28/2020 in [facility] Office Visit from 12/03/2015 in  [facility]  PHQ-2 Total Score 2 3 3 4   PHQ-9 Total Score 15 8 7 6     Flowsheet Row Video Visit from 07/28/2020 in [facility]  C-SSRS RISK CATEGORY No Risk       Assessment and Plan:  Paula [name] is a 40 y.o. year old female with a history of  depression, anxiety, GERD, hypothyroidism , who presents for follow up appointment for below.    1. MDD (major depressive disorder), recurrent episode, mild (HCC) 2. GAD (generalized anxiety disorder) 3. PTSD (post-traumatic stress disorder) She reports worsening in anxiety and irritability since the birth of her second child.  Psychosocial stressors includes marital discordance, and being a caregiver of her children.  She does have significant childhood trauma history.  Will switch from citalopram to venlafaxine given she reports limited benefit from citalopram.  Discussed potential risk of serotonin syndrome, and risk of headache, and high blood pressure.  She will greatly benefit from CBT; will make referral.    Plan I have reviewed and updated plans as below 1. Decrease citalopram 20 mg daily for one week, then discontinue 2. Start Venlafaxine 37.5 mg daily for one week, then 75 mg daily  3. Referral to therapy  4. Next appointment: 4/7 at 9 AM for 30 mins, video - she will discontinue L-theanine  The patient demonstrates the following risk factors for suicide: Chronic risk factors for suicide include: psychiatric disorder of depression, anxiety. Acute risk factors for suicide include: N/A. Protective factors for this patient include: positive social support, coping skills and hope for the future. Considering these factors, the overall suicide risk at this point appears to be low. Patient is appropriate for outpatient follow up.    30, MD 08/30/2020, 8:45 AM

## 2020-09-02 ENCOUNTER — Telehealth: Payer: No Typology Code available for payment source | Admitting: Psychiatry

## 2020-09-02 NOTE — Progress Notes (Signed)
Virtual Visit via Video Note  I connected with Paula [name] on 09/08/20 at  2:30 PM EDT by a video enabled telemedicine application and verified that I am speaking with the correct person using two identifiers.  Location: Patient: home Provider: office Persons participated in the visit- patient, provider   I discussed the limitations of evaluation and management by telemedicine and the availability of in person appointments. The patient expressed understanding and agreed to proceed.    I discussed the assessment and treatment plan with the patient. The patient was provided an opportunity to ask questions and all were answered. The patient agreed with the plan and demonstrated an understanding of the instructions.   The patient was advised to call back or seek an in-person evaluation if the symptoms worsen or if the condition fails to improve as anticipated.  I provided 20 minutes of non-face-to-face time during this encounter.   Neysa Hotter, MD     [facility] MD/PA/NP OP Progress Note  09/08/2020 3:03 PM [name]  MRN:  [id number]  Chief Complaint:  Chief Complaint    Depression; Follow-up     HPI:  This is a follow-up appointment for depression, anxiety and PTSD.  She states that her anxiety has been subsided since starting venlafaxine.  She feels less irritable, and she notices that her daughter has been less irritable as well.  She has been trying to work on as her younger daughter will need to be in her space, although she never experienced it as a child.  She enjoys time with her family; playing soccer few times per week.  She also enjoys [employer] few times per week when her schedule allows.  Although she is busy going through [employer] for her work, she has been able to handle things to take care of all of her children.  She feels fatigued during the day.  She has middle insomnia.  She probably attributes it to sinus symptoms/GERD.  She denies any snoring.   She feels less anxious.  She denies panic attacks.  She denies depression or anhedonia.  Although she gained weight over the past year, she denies any significant change since starting venlafaxine.    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 Her parents were never married. She had estranged relationship with her biological father since she was a child.   Wt Readings from Last 3 Encounters:  08/26/20 172 lb (78 kg)  04/28/20 163 lb 2 oz (74 kg)  12/08/19 159 lb 3.2 oz (72.2 kg)    Visit Diagnosis:    ICD-10-CM   1. MDD (major depressive disorder), recurrent, in partial remission (HCC)  F33.41   2. GAD (generalized anxiety disorder)  F41.1   3. PTSD (post-traumatic stress disorder)  F43.10   4. Insomnia, unspecified type  G47.00     Past Psychiatric History: Please see initial evaluation for full details. I have reviewed the history. No updates at this time.     Past Medical History:  Past Medical History:  Diagnosis Date  . Anemia   . Asthma    exercise induced  . BULIMIA 01/17/2008   Annotation: AGE 53 Qualifier: Diagnosis of  By: Lorrene Reid LPN, Burna Mortimer    . Complication of anesthesia    vomited during last c/s  . CPD (cephalo-pelvic  disproportion) 12/22/2014  . Cystic fibrosis carrier in second trimester, antepartum 2016  . Depression   . Eczema   . GERD (gastroesophageal reflux disease)    chronic  . Headache    h/o migraines  . Hypothyroidism   . Nausea   . Oligomenorrhea 12/22/2014  . PONV (postoperative nausea and vomiting)   . Rhinitis, allergic     Past Surgical History:  Procedure Laterality Date  . CESAREAN SECTION  04/19/2013   [facility]; CPD  . CESAREAN SECTION N/A 10/27/2015   Procedure: REPEAT CESAREAN SECTION;  Surgeon: Herold HarmsMartin A Defrancesco, MD;  Location: [facility];   Service: Obstetrics;  Laterality: N/A;  . ENDOSCOPIC TURBINATE REDUCTION Bilateral 07/26/2017   Procedure: ENDOSCOPIC INFERIOR  TURBINATE REDUCTION;  Surgeon: Vernie MurdersJuengel, Paul, MD;  [facility];  Service: ENT;  Laterality: Bilateral;  . FRONTAL SINUS EXPLORATION Bilateral 07/26/2017   Procedure: FRONTAL SINUS EXPLORATION;  Surgeon: Vernie MurdersJuengel, Paul, MD;  [facility];  Service: ENT;  Laterality: Bilateral;  . IMAGE GUIDED SINUS SURGERY Bilateral 07/26/2017   Procedure: IMAGE GUIDED SINUS SURGERY;  Surgeon: Vernie MurdersJuengel, Paul, MD;  [facility];  Service: ENT;  Laterality: Bilateral;  GAVE DISK TO CECE 2-12  . SEPTOPLASTY WITH ETHMOIDECTOMY, AND MAXILLARY ANTROSTOMY Bilateral 07/26/2017   Procedure: SEPTOPLASTY WITH TOTAL  ETHMOIDECTOMY, AND MAXILLARY ANTROSTOMYWITH REMOVAL OF TISSUE,;  Surgeon: Vernie MurdersJuengel, Paul, MD;  [facility];  Service: ENT;  Laterality: Bilateral;  . TONSILLECTOMY    . WISDOM TOOTH EXTRACTION      Family Psychiatric History: Please see initial evaluation for full details. I have reviewed the history. No updates at this time.     Family History:  Family History  Problem Relation Age of Onset  . Endometriosis Mother   . Bipolar disorder Mother   . Alcohol abuse Mother   . Heart disease Maternal Grandmother        valvular problem  . Depression Maternal Grandmother   . Breast cancer Neg Hx   . Ovarian cancer Neg Hx   . Colon cancer Neg Hx     Social History:  Social History   Socioeconomic History  . Marital status: Married    Spouse name: Not on file  . Number of children: Not on file  . Years of education: Not on file  . Highest education level: Not on file  Occupational History  . Occupation: ED tech    Employer: [employer]  Tobacco Use  . Smoking status: Never Smoker  . Smokeless tobacco: Never Used  Vaping Use  . Vaping Use: Never used  Substance and Sexual Activity  . Alcohol use: Yes     Alcohol/week: 2.0 standard drinks    Types: 2 Standard drinks or equivalent per week  . Drug use: No  . Sexual activity: Yes    Partners: Male    Birth control/protection: Inserts  Other Topics Concern  . Not on file  Social History Narrative  . Not on file   Social Determinants of Health   Financial Resource Strain: Not on file  Food Insecurity: Not on file  Transportation Needs: Not on file  Physical Activity: Not on file  Stress: Not on file  Social Connections: Not on file    Allergies:  Allergies  Allergen Reactions  . Iodine Anaphylaxis    Contrast   . Shellfish Allergy Hives  . Contrast Media [Iodinated Diagnostic Agents]   . Flagyl [Metronidazole]     Metabolic Disorder Labs: Lab Results  Component Value Date   HGBA1C 5.4 03/26/2019   Lab Results  Component Value Date   PROLACTIN 35.8 (H) 03/14/2016   PROLACTIN 43.4 12/27/2007   Lab Results  Component Value Date   CHOL 259 (H) 03/26/2019   TRIG 110 03/26/2019   HDL 62 03/26/2019   CHOLHDL 4.2 03/26/2019   VLDL 17 09/25/2011   LDLCALC 178 (H) 03/26/2019   LDLCALC 154 (H) 03/20/2017   Lab Results  Component Value Date   TSH 3.900 12/08/2019   TSH 0.858 03/26/2019    Therapeutic Level Labs: No results found for: LITHIUM No results found for: VALPROATE No components found for:  CBMZ  Current Medications: Current Outpatient Medications  Medication Sig Dispense Refill  . traZODone (DESYREL) 50 MG tablet 25-50 mg at night as needed for sleep 30 tablet 0  . albuterol (PROVENTIL HFA;VENTOLIN HFA) 108 (90 Base) MCG/ACT inhaler Inhale 2 puffs into the lungs every 6 (six) hours as needed for wheezing or shortness of breath.    . cetirizine (ZYRTEC) 10 MG tablet Take 10 mg by mouth daily.    Marland Kitchen etonogestrel-ethinyl estradiol (NUVARING) 0.12-0.015 MG/24HR vaginal ring INSERT 1 RING VAGINALLY AND LEAVE IN PLACE FOR 3 CONSECUTIVE WEEKS, THEN REMOVE FOR 1 WEEK. 3 each 4  . famotidine (PEPCID) 40 MG tablet  TAKE 1 TABLET (40 MG TOTAL) BY MOUTH AT BEDTIME. 90 tablet 1  . L-Theanine 100 MG CAPS Take 2 capsules by mouth daily. (Patient not taking: Reported on 08/26/2020)    . levothyroxine (SYNTHROID) 75 MCG tablet TAKE 1 TABLET BY MOUTH DAILY BEFORE BREAKFAST 30 tablet 11  . levothyroxine (SYNTHROID) 75 MCG tablet TAKE 1 TABLET BY MOUTH DAILY BEFORE BREAKFAST 30 tablet 0  . montelukast (SINGULAIR) 10 MG tablet Take by mouth.    . montelukast (SINGULAIR) 10 MG tablet TAKE 1 TABLET BY MOUTH DAILY AT NIGHT 90 tablet 1  . omeprazole (PRILOSEC) 40 MG capsule TAKE 1 CAPSULE (40 MG TOTAL) BY MOUTH IN THE MORNING AND AT BEDTIME. 180 capsule 1  . predniSONE (DELTASONE) 10 MG tablet TAKE 1 TABLET BY MOUTH ONCE DAILY FOR 10 DAYS 10 tablet 0  . triamcinolone (NASACORT) 55 MCG/ACT AERO nasal inhaler Place into the nose. (Patient not taking: Reported on 08/26/2020)    . venlafaxine XR (EFFEXOR-XR) 75 MG 24 hr capsule Take 1 capsule (75 mg total) by mouth daily. 30 capsule 0   No current facility-administered medications for this visit.     Musculoskeletal: Strength & Muscle Tone: N/A Gait & Station: N/A Patient leans: N/A  Psychiatric Specialty Exam: Review of Systems  Psychiatric/Behavioral: Positive for sleep disturbance. Negative for agitation, behavioral problems, confusion, decreased concentration, dysphoric mood, hallucinations, self-injury and suicidal ideas. The patient is nervous/anxious. The patient is not hyperactive.   All other systems reviewed and are negative.   currently breastfeeding.There is no height or weight on file to calculate BMI.  General Appearance: Fairly Groomed  Eye Contact:  Good  Speech:  Clear and Coherent  Volume:  Normal  Mood:  less anxious  Affect:  Appropriate, Congruent and fatigue  Thought Process:  Coherent  Orientation:  Full (Time, Place, and Person)  Thought Content: Logical   Suicidal Thoughts:  No  Homicidal Thoughts:  No  Memory:  Immediate;   Good   Judgement:  Good  Insight:  Good  Psychomotor Activity:  Normal  Concentration:  Concentration: Good and Attention Span: Good  Recall:  Good  Fund of Knowledge: Good  Language: Good  Akathisia:  No  Handed:  Right  AIMS (if indicated): not done  Assets:  Communication Skills Desire for Improvement  ADL's:  Intact  Cognition: WNL  Sleep:  Good   Screenings: GAD-7   Flowsheet Row Office Visit from 06/16/2020 in [facility] Office Visit from 04/28/2020 in [facility]  Total GAD-7 Score 7 8    PHQ2-9   Flowsheet Row Video Visit from 09/08/2020 in [facility] Video Visit from 07/28/2020 in [facility] Office Visit from 06/16/2020 in [facility] Office Visit from 04/28/2020 in [facility] Office Visit from 12/03/2015 in [facility]  PHQ-2 Total Score 0 2 3 3 4   PHQ-9 Total Score -- 15 8 7 6     Flowsheet Row Video Visit from 09/08/2020 in [facility] Video Visit from 07/28/2020 in [facility]  C-SSRS RISK CATEGORY No Risk No Risk       Assessment and Plan:  SHANEKA EFAW is a 40 y.o. year old female with a history of depression, anxiety, GERD, hypothyroidism, who presents for follow up appointment for below.   1. MDD (major depressive disorder), recurrent, in partial remission (HCC 2. GAD (generalized anxiety disorder 3. PTSD (post-traumatic stress disorder) There has been significant improvement in her mood symptoms since switching from citalopram to venlafaxine. Psychosocial stressors includes marital discordance, and being a caregiver of her children.  She does have significant childhood trauma history.    Will continue current dose of venlafaxine to target depression, anxiety and PTSD.  Noted that she reports ongoing fatigue; will consider up titration in the future if she has limited benefit from trazodone.   4.  Insomnia, unspecified type She complains of middle insomnia and fatigue. She has no known snoring. Will try trazodone to target insomnia.    Plan I have reviewed and updated plans as below 1. Continue venlafaxine 75 mg daily  2. Start Trazodone 25-50 mg at night as needed for sleep 3. Next appointment: 5/11 at 11 AM for 30 mins, video - she will discontinue L-theanine  The patient demonstrates the following risk factors for suicide: Chronic risk factors for suicide include: psychiatric disorder of depression, anxiety. Acute risk factors for suicide include: N/A. Protective factors for this patient include: positive social support, coping skills and hope for the future. Considering these factors, the overall suicide risk at this point appears to be low. Patient is appropriate for outpatient follow up.   30, MD 09/08/2020, 3:03 PM

## 2020-09-06 ENCOUNTER — Encounter: Attending: Specialist

## 2020-09-08 ENCOUNTER — Encounter: Payer: Self-pay | Admitting: Psychiatry

## 2020-09-08 ENCOUNTER — Telehealth (INDEPENDENT_AMBULATORY_CARE_PROVIDER_SITE_OTHER): Payer: No Typology Code available for payment source | Admitting: Psychiatry

## 2020-09-08 ENCOUNTER — Other Ambulatory Visit: Payer: Self-pay

## 2020-09-08 DIAGNOSIS — F3341 Major depressive disorder, recurrent, in partial remission: Secondary | ICD-10-CM

## 2020-09-08 DIAGNOSIS — G4700 Insomnia, unspecified: Secondary | ICD-10-CM

## 2020-09-08 DIAGNOSIS — F411 Generalized anxiety disorder: Secondary | ICD-10-CM | POA: Diagnosis not present

## 2020-09-08 DIAGNOSIS — F4310 Post-traumatic stress disorder, unspecified: Secondary | ICD-10-CM

## 2020-09-08 MED ORDER — TRAZODONE HCL 50 MG PO TABS
ORAL_TABLET | ORAL | 0 refills | Status: DC
  Filled 2020-09-08: qty 30, 30d supply, fill #0

## 2020-09-08 MED ORDER — VENLAFAXINE HCL ER 75 MG PO CP24
75.0000 mg | ORAL_CAPSULE | Freq: Every day | ORAL | 0 refills | Status: DC
  Filled 2020-09-08 – 2020-09-23 (×2): qty 30, 30d supply, fill #0

## 2020-09-08 NOTE — Patient Instructions (Signed)
1. Continue venlafaxine 75 mg daily  2. Start Trazodone 25-50 mg at night as needed for sleep 3. Next appointment: 5/11 at 11 AM

## 2020-09-23 ENCOUNTER — Other Ambulatory Visit: Payer: Self-pay

## 2020-09-23 MED FILL — Levothyroxine Sodium Tab 75 MCG: ORAL | 30 days supply | Qty: 30 | Fill #1 | Status: CN

## 2020-09-24 ENCOUNTER — Other Ambulatory Visit: Payer: Self-pay

## 2020-09-28 NOTE — Progress Notes (Signed)
Virtual Visit via Video Note  I connected with Paula [name] on 10/06/20 at 11:00 AM EDT by a video enabled telemedicine application and verified that I am speaking with the correct person using two identifiers.  Location: Patient: home Provider: office Persons participated in the visit- patient, provider   I discussed the limitations of evaluation and management by telemedicine and the availability of in person appointments. The patient expressed understanding and agreed to proceed.     I discussed the assessment and treatment plan with the patient. The patient was provided an opportunity to ask questions and all were answered. The patient agreed with the plan and demonstrated an understanding of the instructions.   The patient was advised to call back or seek an in-person evaluation if the symptoms worsen or if the condition fails to improve as anticipated.  I provided 24 minutes of non-face-to-face time during this encounter.   Neysa Hotter, MD    [facility] MD/PA/NP OP Progress Note  10/06/2020 11:34 AM Paula [name]  MRN:  [id number]  Chief Complaint:  Chief Complaint    Follow-up; Depression; Trauma     HPI:  This is a follow-up appointment for PTSD and depression.  She states that she had a visit with her OB/GYN due to diaphoresis and change in her hair texture.  Labs were done.  Her provider is aware that she has PCOS.  She states that she had meltdown the other day; she was fussing at her children.  She felt like she was a mom, although she hated it.  She feels frustrated when her children did not listen to her.  She later apologized to her children about her behavior.  She also states that she occasionally feels awkward when she is with her youngest daughter, who asks more affection. . she states that she can still get overstimulated when she is around with many people.  She does not know how to settle her mind and body in these situation. She likes her work, and denies  any concern.  She gained weight before being started on venlafaxine. She has started meal program, and started exercise.  She has random nightmares.  She has occasional flashback.  She has occasional panic attacks.   She is willing to try higher dose of Effexor.  She has been sleeping better since being started on trazodone.   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 Her parents were never married. She had estranged relationship with her biological father since she was a child.  Visit Diagnosis:    ICD-10-CM   1. PTSD (post-traumatic stress disorder)  F43.10   2. MDD (major depressive disorder), recurrent, in partial remission (HCC)  F33.41   3. GAD (generalized anxiety disorder)  F41.1   4. Insomnia, unspecified type  G47.00     Past Psychiatric History: Please see initial evaluation for full details. I have reviewed the history. No updates at this time.     Past Medical History:  Past Medical History:  Diagnosis Date  . Anemia   . Asthma    exercise induced  . BULIMIA 01/17/2008   Annotation: AGE 40 Qualifier: Diagnosis of  By: Lorrene Reid LPN, Burna Mortimer    . Complication of anesthesia    vomited during last c/s  . 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 Cystic fibrosis carrier in second trimester, antepartum 2016  .  Depression   . Eczema   . GERD (gastroesophageal reflux disease)    chronic  . Headache    h/o migraines  . Hypothyroidism   . Nausea   . Oligomenorrhea 12/22/2014  . PONV (postoperative nausea and vomiting)   . Rhinitis, allergic     Past Surgical History:  Procedure Laterality Date  . CESAREAN SECTION  04/19/2013   [facility]; CPD  . CESAREAN SECTION N/A 10/27/2015   Procedure: REPEAT CESAREAN SECTION;  Surgeon: Herold Harms, MD;  Location: [facility]  ORS;  Service: Obstetrics;  Laterality: N/A;  . ENDOSCOPIC TURBINATE REDUCTION Bilateral 07/26/2017   Procedure: ENDOSCOPIC INFERIOR  TURBINATE REDUCTION;  Surgeon: Vernie Murders, MD;  Location: [facility];  Service: ENT;  Laterality: Bilateral;  . FRONTAL SINUS EXPLORATION Bilateral 07/26/2017   Procedure: FRONTAL SINUS EXPLORATION;  Surgeon: Vernie Murders, MD;  Location: [facility];  Service: ENT;  Laterality: Bilateral;  . IMAGE GUIDED SINUS SURGERY Bilateral 07/26/2017   Procedure: IMAGE GUIDED SINUS SURGERY;  Surgeon: Vernie Murders, MD;  Location: [facility];  Service: ENT;  Laterality: Bilateral;  GAVE DISK TO CECE 2-12  . SEPTOPLASTY WITH ETHMOIDECTOMY, AND MAXILLARY ANTROSTOMY Bilateral 07/26/2017   Procedure: SEPTOPLASTY WITH TOTAL  ETHMOIDECTOMY, AND MAXILLARY ANTROSTOMYWITH REMOVAL OF TISSUE,;  Surgeon: Vernie Murders, MD;  Location: [facility];  Service: ENT;  Laterality: Bilateral;  . TONSILLECTOMY    . WISDOM TOOTH EXTRACTION      Family Psychiatric History: Please see initial evaluation for full details. I have reviewed the history. No updates at this time.     Family History:  Family History  Problem Relation Age of Onset  . Endometriosis Mother   . Bipolar disorder Mother   . Alcohol abuse Mother   . Heart disease Maternal Grandmother        valvular problem  . Depression Maternal Grandmother   . Breast cancer Neg Hx   . Ovarian cancer Neg Hx   . Colon cancer Neg Hx     Social History:  Social History   Socioeconomic History  . Marital status: Married    Spouse name: Not on file  . Number of children: Not on file  . Years of education: Not on file  . Highest education level: Not on file  Occupational History  . Occupation: ED tech    Employer: [employer]  Tobacco Use  . Smoking status: Never Smoker  . Smokeless tobacco: Never Used  Vaping Use  . Vaping Use: Never used  Substance and Sexual Activity  . Alcohol use: Yes     Alcohol/week: 2.0 standard drinks    Types: 2 Standard drinks or equivalent per week  . Drug use: No  . Sexual activity: Yes    Partners: Male    Birth control/protection: Inserts  Other Topics Concern  . Not on file  Social History Narrative  . Not on file   Social Determinants of Health   Financial Resource Strain: Not on file  Food Insecurity: Not on file  Transportation Needs: Not on file  Physical Activity: Not on file  Stress: Not on file  Social Connections: Not on file    Allergies:  Allergies  Allergen Reactions  . Iodine Anaphylaxis    Contrast   . Shellfish Allergy Hives  . Contrast Media [Iodinated Diagnostic Agents]   . Flagyl [Metronidazole]     Metabolic Disorder Labs: Lab Results  Component Value Date   HGBA1C 5.4 03/26/2019   Lab Results  Component  Value Date   PROLACTIN 35.8 (H) 03/14/2016   PROLACTIN 43.4 12/27/2007   Lab Results  Component Value Date   CHOL 259 (H) 03/26/2019   TRIG 110 03/26/2019   HDL 62 03/26/2019   CHOLHDL 4.2 03/26/2019   VLDL 17 09/25/2011   LDLCALC 178 (H) 03/26/2019   LDLCALC 154 (H) 03/20/2017   Lab Results  Component Value Date   TSH 3.900 12/08/2019   TSH 0.858 03/26/2019    Therapeutic Level Labs: No results found for: LITHIUM No results found for: VALPROATE No components found for:  CBMZ  Current Medications: Current Outpatient Medications  Medication Sig Dispense Refill  . venlafaxine XR (EFFEXOR-XR) 150 MG 24 hr capsule Take 1 capsule (150 mg total) by mouth daily with breakfast. 90 capsule 0  . albuterol (PROVENTIL HFA;VENTOLIN HFA) 108 (90 Base) MCG/ACT inhaler Inhale 2 puffs into the lungs every 6 (six) hours as needed for wheezing or shortness of breath.    . cetirizine (ZYRTEC) 10 MG tablet Take 10 mg by mouth daily.    Marland Kitchen. etonogestrel-ethinyl estradiol (NUVARING) 0.12-0.015 MG/24HR vaginal ring INSERT 1 RING VAGINALLY AND LEAVE IN PLACE FOR 3 CONSECUTIVE WEEKS, THEN REMOVE FOR 1 WEEK. 3 each  4  . famotidine (PEPCID) 40 MG tablet TAKE 1 TABLET (40 MG TOTAL) BY MOUTH AT BEDTIME. 90 tablet 1  . L-Theanine 100 MG CAPS Take 2 capsules by mouth daily. (Patient not taking: Reported on 08/26/2020)    . levothyroxine (SYNTHROID) 75 MCG tablet TAKE 1 TABLET BY MOUTH DAILY BEFORE BREAKFAST 30 tablet 11  . levothyroxine (SYNTHROID) 75 MCG tablet TAKE 1 TABLET BY MOUTH DAILY BEFORE BREAKFAST 30 tablet 0  . montelukast (SINGULAIR) 10 MG tablet Take by mouth.    . montelukast (SINGULAIR) 10 MG tablet TAKE 1 TABLET BY MOUTH DAILY AT NIGHT 90 tablet 1  . omeprazole (PRILOSEC) 40 MG capsule TAKE 1 CAPSULE (40 MG TOTAL) BY MOUTH IN THE MORNING AND AT BEDTIME. 180 capsule 1  . predniSONE (DELTASONE) 10 MG tablet TAKE 1 TABLET BY MOUTH ONCE DAILY FOR 10 DAYS 10 tablet 0  . traZODone (DESYREL) 50 MG tablet Take 1 tablet (50 mg total) by mouth at bedtime as needed for sleep. 90 tablet 0  . triamcinolone (NASACORT) 55 MCG/ACT AERO nasal inhaler Place into the nose. (Patient not taking: Reported on 08/26/2020)    . venlafaxine XR (EFFEXOR-XR) 75 MG 24 hr capsule Take 1 capsule (75 mg total) by mouth daily. 30 capsule 0   No current facility-administered medications for this visit.     Musculoskeletal: Strength & Muscle Tone: N/A Gait & Station: N/A Patient leans: N/A  Psychiatric Specialty Exam: Review of Systems  Psychiatric/Behavioral: Positive for dysphoric mood and sleep disturbance. Negative for agitation, behavioral problems, confusion, decreased concentration, hallucinations, self-injury and suicidal ideas. The patient is nervous/anxious. The patient is not hyperactive.   All other systems reviewed and are negative.   currently breastfeeding.There is no height or weight on file to calculate BMI.  General Appearance: Fairly Groomed  Eye Contact:  Good  Speech:  Clear and Coherent  Volume:  Normal  Mood:  Irritable  Affect:  Appropriate, Congruent and slightly tense at times  Thought  Process:  Coherent  Orientation:  Full (Time, Place, and Person)  Thought Content: Logical   Suicidal Thoughts:  No  Homicidal Thoughts:  No  Memory:  Immediate;   Good  Judgement:  Good  Insight:  Good  Psychomotor Activity:  Normal  Concentration:  Concentration: Good and Attention Span: Good  Recall:  Good  Fund of Knowledge: Good  Language: Good  Akathisia:  No  Handed:  Right  AIMS (if indicated): not done  Assets:  Communication Skills Desire for Improvement  ADL's:  Intact  Cognition: WNL  Sleep:  Poor   Screenings: GAD-7   Flowsheet Row Office Visit from 06/16/2020 in [facility] Office Visit from 04/28/2020 in [facility]  Total GAD-7 Score 7 8    PHQ2-9   Flowsheet Row Video Visit from 10/06/2020 in [facility] Video Visit from 09/08/2020 in [facility] Video Visit from 07/28/2020 in [facility] Office Visit from 06/16/2020 in [facility] Office Visit from 04/28/2020 in [facility]  PHQ-2 Total Score 1 0 2 3 3   PHQ-9 Total Score -- -- 15 8 7     Flowsheet Row Video Visit from 10/06/2020 in [facility] Video Visit from 09/08/2020 in [facility] Video Visit from 07/28/2020 in [facility]  C-SSRS RISK CATEGORY No Risk No Risk No Risk       Assessment and Plan:  CHENILLE TOOR is a 39 y.o. year old female with a history of depression, anxiety, GERD, hypothyroidism, PCOS, who presents for follow up appointment for below.   1. MDD (major depressive disorder), recurrent, in partial remission (HCC) 2. GAD (generalized anxiety disorder) 3. PTSD (post-traumatic stress disorder) She continues to experience hypervigilance and irritability, anxiety since the last visit, although there has been overall improvement in depressive symptoms since switching from citalopram to  venlafaxine. Psychosocial stressors includes marital discordance,and being a caregiver of her children.She does have significant childhood trauma history.   Will uptitrate venlafaxine to optimize treatment for depression, anxiety and PTSD.  She will greatly benefit from CBT; will make referral.   4. Insomnia, unspecified type She reports good benefit from trazodone.  Will continue current dose to target insomnia.   Plan 1. Increase venlafaxine 150 mg daily  2. Continue Trazodone 50 mg at night as needed for sleep 3. Next appointment: 6/15 at 10 AM for 30 mins, video   The patient demonstrates the following risk factors for suicide: Chronic risk factors for suicide include:psychiatric disorder ofdepression, anxiety. Acute risk factorsfor suicide include: N/A. Protective factorsfor this patient include: positive social support, coping skills and hope for the future. Considering these factors, the overall suicide risk at this point appears to below. Patientisappropriate for outpatient follow up.  30, MD 10/06/2020, 11:34 AM

## 2020-09-29 ENCOUNTER — Other Ambulatory Visit (HOSPITAL_COMMUNITY): Payer: Self-pay

## 2020-10-05 ENCOUNTER — Other Ambulatory Visit: Payer: Self-pay | Admitting: Certified Nurse Midwife

## 2020-10-05 ENCOUNTER — Other Ambulatory Visit: Payer: Self-pay

## 2020-10-05 DIAGNOSIS — R232 Flushing: Secondary | ICD-10-CM

## 2020-10-05 DIAGNOSIS — R6882 Decreased libido: Secondary | ICD-10-CM

## 2020-10-06 ENCOUNTER — Other Ambulatory Visit: Payer: Self-pay

## 2020-10-06 ENCOUNTER — Other Ambulatory Visit: Payer: No Typology Code available for payment source

## 2020-10-06 ENCOUNTER — Encounter: Payer: Self-pay | Admitting: Psychiatry

## 2020-10-06 ENCOUNTER — Telehealth (INDEPENDENT_AMBULATORY_CARE_PROVIDER_SITE_OTHER): Payer: No Typology Code available for payment source | Admitting: Psychiatry

## 2020-10-06 DIAGNOSIS — G4700 Insomnia, unspecified: Secondary | ICD-10-CM

## 2020-10-06 DIAGNOSIS — F4310 Post-traumatic stress disorder, unspecified: Secondary | ICD-10-CM

## 2020-10-06 DIAGNOSIS — F3341 Major depressive disorder, recurrent, in partial remission: Secondary | ICD-10-CM

## 2020-10-06 DIAGNOSIS — F411 Generalized anxiety disorder: Secondary | ICD-10-CM

## 2020-10-06 MED ORDER — TRAZODONE HCL 50 MG PO TABS
50.0000 mg | ORAL_TABLET | Freq: Every evening | ORAL | 0 refills | Status: DC | PRN
  Filled 2020-10-06: qty 90, 90d supply, fill #0

## 2020-10-06 MED ORDER — VENLAFAXINE HCL ER 150 MG PO CP24
150.0000 mg | ORAL_CAPSULE | Freq: Every day | ORAL | 0 refills | Status: DC
  Filled 2020-10-06: qty 90, 90d supply, fill #0

## 2020-10-06 NOTE — Patient Instructions (Signed)
1. Increase venlafaxine 150 mg daily  2. Continue Trazodone 50 mg at night as needed for sleep 3. Next appointment: 6/15 at 10 AM

## 2020-10-07 LAB — ESTRADIOL: Estradiol: <5 pg/mL

## 2020-10-07 LAB — THYROID PANEL WITH TSH
Free Thyroxine Index: 1.9 (ref 1.2–4.9)
T3 Uptake Ratio: 20 % — ABNORMAL LOW (ref 24–39)
T4, Total: 9.4 ug/dL (ref 4.5–12.0)
TSH: 2.07 u[IU]/mL (ref 0.450–4.500)

## 2020-10-07 LAB — TESTOSTERONE: Testosterone: 14 ng/dL (ref 8–60)

## 2020-10-07 LAB — FOLLICLE STIMULATING HORMONE: FSH: 2.9 m[IU]/mL

## 2020-10-08 ENCOUNTER — Other Ambulatory Visit: Payer: Self-pay

## 2020-10-08 MED FILL — Levothyroxine Sodium Tab 75 MCG: ORAL | 30 days supply | Qty: 30 | Fill #1 | Status: AC

## 2020-10-12 ENCOUNTER — Ambulatory Visit: Admit: 2020-10-12 | Discharge: 2020-10-12 | Payer: PRIVATE HEALTH INSURANCE | Attending: Women's Health

## 2020-10-12 ENCOUNTER — Ambulatory Visit: Payer: No Typology Code available for payment source | Admitting: Cardiovascular Disease

## 2020-10-12 DIAGNOSIS — Z113 Encounter for screening for infections with a predominantly sexual mode of transmission: Secondary | ICD-10-CM

## 2020-10-12 NOTE — Progress Notes (Signed)
Subjective:      Patient ID:  Olivia Castro is a 40 y.o. female who presents for   Chief Complaint   Patient presents with   ??? Exposure to STD       HPI     Patient is a 40 yo female who presents for STD screening.  Patient denies any vaginal  itching, odor or irritation at this time.  Patient reports that she has had white  vaginal discharge for approx. 1 week.     Review of Systems   Constitutional: Negative for chills and fever.   HENT: Negative.    Eyes: Negative.    Respiratory: Negative.    Cardiovascular: Negative.    Gastrointestinal: Negative.    Endocrine: Negative.    Genitourinary: Positive for vaginal discharge (white discharge for the past 1 wk). Negative for dysuria and menstrual problem.   Musculoskeletal: Negative.    Skin: Negative.    Allergic/Immunologic: Negative.    Neurological: Negative.    Hematological: Negative.    Psychiatric/Behavioral: Negative.      BP 112/73 (Site: Right Upper Arm, Position: Sitting)    Pulse 70    Ht 5\' 4"  (1.626 m)    Wt 175 lb 9.6 oz (79.7 kg)    LMP 09/28/2020 (Within Days)    BMI 30.14 kg/m??    Patient's last menstrual period was 09/28/2020 (within days).    Family History   Problem Relation Age of Onset   ??? Lung Cancer Maternal Grandmother    ??? Diabetes Father    ??? Hypertension Father    ??? High Blood Pressure Father    ??? Prostate Cancer Father    ??? Diabetes Mother    ??? Hypertension Mother    ??? High Blood Pressure Mother    ??? Breast Cancer Paternal Aunt       No past medical history on file.   Past Surgical History:   Procedure Laterality Date   ??? ECTOPIC PREGNANCY SURGERY        Social History     Socioeconomic History   ??? Marital status: Single     Spouse name: None   ??? Number of children: None   ??? Years of education: None   ??? Highest education level: None   Occupational History   ??? None   Tobacco Use   ??? Smoking status: Never Smoker   ??? Smokeless tobacco: Never Used   Substance and Sexual Activity   ??? Alcohol use: No   ??? Drug use: No   ??? Sexual  activity: Never   Other Topics Concern   ??? None   Social History Narrative   ??? None     Social Determinants of Health     Financial Resource Strain: Low Risk    ??? Difficulty of Paying Living Expenses: Not hard at all   Food Insecurity: No Food Insecurity   ??? Worried About 11/28/2020 in the Last Year: Never true   ??? Ran Out of Food in the Last Year: Never true   Transportation Needs:    ??? Lack of Transportation (Medical): Not on file   ??? Lack of Transportation (Non-Medical): Not on file   Physical Activity:    ??? Days of Exercise per Week: Not on file   ??? Minutes of Exercise per Session: Not on file   Stress:    ??? Feeling of Stress : Not on file   Social Connections:    ??? Frequency of  Communication with Friends and Family: Not on file   ??? Frequency of Social Gatherings with Friends and Family: Not on file   ??? Attends Religious Services: Not on file   ??? Active Member of Clubs or Organizations: Not on file   ??? Attends Banker Meetings: Not on file   ??? Marital Status: Not on file   Intimate Partner Violence:    ??? Fear of Current or Ex-Partner: Not on file   ??? Emotionally Abused: Not on file   ??? Physically Abused: Not on file   ??? Sexually Abused: Not on file   Housing Stability:    ??? Unable to Pay for Housing in the Last Year: Not on file   ??? Number of Places Lived in the Last Year: Not on file   ??? Unstable Housing in the Last Year: Not on file      Current Outpatient Medications   Medication Sig Dispense Refill   ??? tiZANidine (ZANAFLEX) 2 MG tablet 1 tab bid and 2 tab at bedtime as needed     ??? meloxicam (MOBIC) 15 MG tablet 1 tablet     ??? Multiple Vitamins-Minerals (MULTI COMPLETE PO) 1 Tablet       No current facility-administered medications for this visit.        Objective:   Physical Exam  Vitals reviewed.   Constitutional:       Appearance: She is well-developed.   HENT:      Head: Normocephalic and atraumatic.   Eyes:      Conjunctiva/sclera: Conjunctivae normal.   Cardiovascular:      Rate  and Rhythm: Normal rate and regular rhythm.   Pulmonary:      Effort: Pulmonary effort is normal.      Breath sounds: Normal breath sounds.   Genitourinary:     Vagina: Vaginal discharge (thin white discharge) present.   Musculoskeletal:         General: Normal range of motion.      Cervical back: Normal range of motion and neck supple.   Skin:     General: Skin is warm and dry.   Neurological:      Mental Status: She is oriented to person, place, and time.   Psychiatric:         Behavior: Behavior normal.         Thought Content: Thought content normal.         Judgment: Judgment normal.         Assessment:         Diagnosis Orders   1. Screen for STD (sexually transmitted disease)  Vaginitis DNA Probe    C.trachomatis N.gonorrhoeae DNA    HIV Screen    T. Pallidum Ab    Herpes Profile    Hepatitis C Antibody    Hepatitis B Surface Antigen   2. Acute vaginitis          Plan:      Return for as needed.    Patient was seen with total face to face time of 20 minutes. More than 50% of this visit was on counseling andeducation regarding the problems listed below and her options. She was also counseled on her preventative health maintenance recommendations and follow-up.    Electronically signed by: Roanna Raider, CNP

## 2020-10-13 ENCOUNTER — Inpatient Hospital Stay: Payer: PRIVATE HEALTH INSURANCE

## 2020-10-13 LAB — VAGINITIS DNA PROBE
Candida Species, DNA Probe: NEGATIVE
Gardnerella Vaginalis, DNA Probe: NEGATIVE
Trichomonas Vaginalis DNA: NEGATIVE

## 2020-10-13 LAB — HEPATITIS B SURFACE ANTIGEN: Hepatitis B Surface Ag: NONREACTIVE

## 2020-10-13 LAB — C.TRACHOMATIS N.GONORRHOEAE DNA
C. trachomatis DNA: NEGATIVE
N. gonorrhoeae DNA: NEGATIVE

## 2020-10-13 LAB — T. PALLIDUM AB: T. pallidum, IgG: NONREACTIVE

## 2020-10-13 LAB — HEPATITIS C ANTIBODY: Hepatitis C Ab: NONREACTIVE

## 2020-10-13 LAB — HIV SCREEN: HIV Ag/Ab: NONREACTIVE

## 2020-10-14 LAB — HERPES PROFILE
HSV I IgG: 3.41 — ABNORMAL HIGH (ref <0.91)
HSV II IgG: 0.41 (ref <0.91)
Herpes Type 1/2 IgM Combined: 0.84 (ref <0.91)

## 2020-10-15 NOTE — Telephone Encounter (Signed)
Patient notified of HSV results.

## 2020-11-08 ENCOUNTER — Other Ambulatory Visit: Payer: Self-pay | Admitting: Certified Nurse Midwife

## 2020-11-08 DIAGNOSIS — R232 Flushing: Secondary | ICD-10-CM

## 2020-11-08 NOTE — Progress Notes (Signed)
Virtual Visit via Video Note  I connected with Paula [name] on 11/10/20 at 10:00 AM EDT by a video enabled telemedicine application and verified that I am speaking with the correct person using two identifiers.  Location: Patient: home Provider: office Persons participated in the visit- patient, provider    I discussed the limitations of evaluation and management by telemedicine and the availability of in person appointments. The patient expressed understanding and agreed to proceed.   I discussed the assessment and treatment plan with the patient. The patient was provided an opportunity to ask questions and all were answered. The patient agreed with the plan and demonstrated an understanding of the instructions.   The patient was advised to call back or seek an in-person evaluation if the symptoms worsen or if the condition fails to improve as anticipated.  I provided 15 minutes of non-face-to-face time during this encounter.   Neysa Hotter, MD    [facility] MD/PA/NP OP Progress Note  11/10/2020 10:30 AM Paula [name]  MRN:  [id number]  Chief Complaint:  Chief Complaint   Trauma; Follow-up; Depression    HPI:  This is a follow-up appointment for depression, anxiety and PTSD.  She believes that higher dose of venlafaxine has been differentially helpful.  She feels calmer.  She has been able to set limits with her daughter without significant irritability.  She enjoys being with them when she does not work.  She is concerned that she has excessive yawning, which occurred 3 times over the past several months.  She will be having evaluation for menopause after she has taken out Nuvaring.  She feels comfortable to stay on the current medication regimen at this time.  She denies feeling depressed.  She has lost weight since working on [employer].  She is healthy food.  She feels good about her appearance, and notices that she does not have shortness of breath anymore.  She denies SI.   She denies nightmares, flashback or hypervigilance.  She takes trazodone only occasionally for insomnia.  She tends to sleep "hard "most of the time.  She denies significant fatigue except that she feels tired at the end of the day/work.   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 Her parents were never married. She had estranged relationship with her biological father since she was a child.   Visit Diagnosis:    ICD-10-CM   1. MDD (major depressive disorder), recurrent, in partial remission (HCC)  F33.41     2. PTSD (post-traumatic stress disorder)  F43.10     3. GAD (generalized anxiety disorder)  F41.1     4. Insomnia, unspecified type  G47.00       Past Psychiatric History: Please see initial evaluation for full details. I have reviewed the history. No updates at this time.     Past Medical History:  Past Medical History:  Diagnosis Date   Anemia    Asthma    exercise induced   BULIMIA 01/17/2008   Annotation: AGE 45 Qualifier: Diagnosis of  By: Lorrene Reid LPN, Wanda     Complication of  anesthesia    vomited during last c/s   CPD (cephalo-pelvic disproportion) 12/22/2014   Cystic fibrosis carrier in second trimester, antepartum 2016   Depression    Eczema    GERD (gastroesophageal reflux disease)    chronic   Headache    h/o migraines   Hypothyroidism    Nausea    Oligomenorrhea 12/22/2014   PONV (postoperative nausea and vomiting)    Rhinitis, allergic     Past Surgical History:  Procedure Laterality Date   CESAREAN SECTION  04/19/2013   LTCS; CPD   CESAREAN SECTION N/A 10/27/2015   Procedure: REPEAT CESAREAN SECTION;   Surgeon: Herold Harms, MD;  Location: [facility] ORS;  Service: Obstetrics;  Laterality: N/A;   ENDOSCOPIC TURBINATE REDUCTION Bilateral 07/26/2017   Procedure: ENDOSCOPIC INFERIOR  TURBINATE REDUCTION;  Surgeon: Vernie Murders, MD;  Location: [facility];  Service: ENT;  Laterality: Bilateral;   FRONTAL SINUS EXPLORATION Bilateral 07/26/2017   Procedure: FRONTAL SINUS EXPLORATION;  Surgeon: Vernie Murders, MD;  Location: [facility];  Service: ENT;  Laterality: Bilateral;   IMAGE GUIDED SINUS SURGERY Bilateral 07/26/2017   Procedure: IMAGE GUIDED SINUS SURGERY;  Surgeon: Vernie Murders, MD;  Location: [facility];  Service: ENT;  Laterality: Bilateral;  GAVE DISK TO CECE 2-12   SEPTOPLASTY WITH ETHMOIDECTOMY, AND MAXILLARY ANTROSTOMY Bilateral 07/26/2017   Procedure: SEPTOPLASTY WITH TOTAL  ETHMOIDECTOMY, AND MAXILLARY ANTROSTOMYWITH REMOVAL OF TISSUE,;  Surgeon: Vernie Murders, MD;  Location: [facility];  Service: ENT;  Laterality: Bilateral;   TONSILLECTOMY     WISDOM TOOTH EXTRACTION      Family Psychiatric History: Please see initial evaluation for full details. I have reviewed the history. No updates at this time.     Family History:  Family History  Problem Relation Age of Onset   Endometriosis Mother    Bipolar disorder Mother    Alcohol abuse Mother    Heart disease Maternal Grandmother        valvular problem   Depression Maternal Grandmother    Breast cancer Neg Hx    Ovarian cancer Neg Hx    Colon cancer Neg Hx     Social History:  Social History   Socioeconomic History   Marital status: Married    Spouse name: Not on file   Number of children: Not on file   Years of education: Not on file   Highest education level: Not on file  Occupational History   Occupation: ED tech    Employer: [employer]  Tobacco Use   Smoking status: Never   Smokeless tobacco: Never  Vaping Use   Vaping Use: Never used  Substance and Sexual Activity   Alcohol  use: Yes    Alcohol/week: 2.0 standard drinks    Types: 2 Standard drinks or equivalent per week   Drug use: No   Sexual activity: Yes    Partners: Male    Birth control/protection: Inserts  Other Topics Concern   Not on file  Social History Narrative   Not on file   Social Determinants of Health   Financial Resource Strain: Not on file  Food Insecurity: Not on file  Transportation Needs: Not on file  Physical Activity: Not on file  Stress: Not on file  Social Connections: Not on file    Allergies:  Allergies  Allergen Reactions   Iodine Anaphylaxis    Contrast    Shellfish Allergy Hives   Contrast Media [Iodinated Diagnostic Agents]    Flagyl [Metronidazole]  Metabolic Disorder Labs: Lab Results  Component Value Date   HGBA1C 5.4 03/26/2019   Lab Results  Component Value Date   PROLACTIN 35.8 (H) 03/14/2016   PROLACTIN 43.4 12/27/2007   Lab Results  Component Value Date   CHOL 259 (H) 03/26/2019   TRIG 110 03/26/2019   HDL 62 03/26/2019   CHOLHDL 4.2 03/26/2019   VLDL 17 09/25/2011   LDLCALC 178 (H) 03/26/2019   LDLCALC 154 (H) 03/20/2017   Lab Results  Component Value Date   TSH 2.070 10/06/2020   TSH 3.900 12/08/2019    Therapeutic Level Labs: No results found for: LITHIUM No results found for: VALPROATE No components found for:  CBMZ  Current Medications: Current Outpatient Medications  Medication Sig Dispense Refill   albuterol (PROVENTIL HFA;VENTOLIN HFA) 108 (90 Base) MCG/ACT inhaler Inhale 2 puffs into the lungs every 6 (six) hours as needed for wheezing or shortness of breath.     cetirizine (ZYRTEC) 10 MG tablet Take 10 mg by mouth daily.     etonogestrel-ethinyl estradiol (NUVARING) 0.12-0.015 MG/24HR vaginal ring INSERT 1 RING VAGINALLY AND LEAVE IN PLACE FOR 3 CONSECUTIVE WEEKS, THEN REMOVE FOR 1 WEEK. 3 each 4   famotidine (PEPCID) 40 MG tablet TAKE 1 TABLET (40 MG TOTAL) BY MOUTH AT BEDTIME. 90 tablet 1   L-Theanine 100 MG CAPS  Take 2 capsules by mouth daily. (Patient not taking: Reported on 08/26/2020)     levothyroxine (SYNTHROID) 75 MCG tablet TAKE 1 TABLET BY MOUTH DAILY BEFORE BREAKFAST 30 tablet 11   levothyroxine (SYNTHROID) 75 MCG tablet TAKE 1 TABLET BY MOUTH DAILY BEFORE BREAKFAST 30 tablet 0   montelukast (SINGULAIR) 10 MG tablet Take by mouth.     montelukast (SINGULAIR) 10 MG tablet TAKE 1 TABLET BY MOUTH DAILY AT NIGHT 90 tablet 1   omeprazole (PRILOSEC) 40 MG capsule TAKE 1 CAPSULE (40 MG TOTAL) BY MOUTH IN THE MORNING AND AT BEDTIME. 180 capsule 1   predniSONE (DELTASONE) 10 MG tablet TAKE 1 TABLET BY MOUTH ONCE DAILY FOR 10 DAYS 10 tablet 0   traZODone (DESYREL) 50 MG tablet Take 1 tablet (50 mg total) by mouth at bedtime as needed for sleep. 90 tablet 0   triamcinolone (NASACORT) 55 MCG/ACT AERO nasal inhaler Place into the nose. (Patient not taking: Reported on 08/26/2020)     venlafaxine XR (EFFEXOR-XR) 150 MG 24 hr capsule Take 1 capsule (150 mg total) by mouth daily with breakfast. 90 capsule 0   venlafaxine XR (EFFEXOR-XR) 75 MG 24 hr capsule Take 1 capsule (75 mg total) by mouth daily. 30 capsule 0   No current facility-administered medications for this visit.     Musculoskeletal: Strength & Muscle Tone:  N/A Gait & Station:  N/A Patient leans: N/A  Psychiatric Specialty Exam: Review of Systems  Psychiatric/Behavioral:  Negative for agitation, behavioral problems, confusion, decreased concentration, dysphoric mood, hallucinations, self-injury, sleep disturbance and suicidal ideas. The patient is not nervous/anxious and is not hyperactive.   All other systems reviewed and are negative.  currently breastfeeding.There is no height or weight on file to calculate BMI.  General Appearance: Fairly Groomed  Eye Contact:  Good  Speech:  Clear and Coherent  Volume:  Normal  Mood:   good  Affect:  Appropriate, Congruent, and euthymic  Thought Process:  Coherent  Orientation:  Full (Time, Place,  and Person)  Thought Content: Logical   Suicidal Thoughts:  No  Homicidal Thoughts:  No  Memory:  Immediate;  Good  Judgement:  Good  Insight:  Good  Psychomotor Activity:  Normal  Concentration:  Concentration: Good and Attention Span: Good  Recall:  Good  Fund of Knowledge: Good  Language: Good  Akathisia:  No  Handed:  Right  AIMS (if indicated): not done  Assets:  Communication Skills Desire for Improvement  ADL's:  Intact  Cognition: WNL  Sleep:  Good   Screenings: GAD-7    Flowsheet Row Office Visit from 06/16/2020 in [facility] Office Visit from 04/28/2020 in [facility]  Total GAD-7 Score 7 8      PHQ2-9    Flowsheet Row Video [facility]10/2020 in [facility] Video Visit from 10/06/2020 in [facility] Video Visit from 09/08/2020 in [facility] Video Visit from 07/28/2020 in [facility] Office Visit from 06/16/2020 in [facility]  PHQ-2 Total Score 0 1 0 2 3  PHQ-9 Total Score -- -- -- 15 8      Flowsheet Row Video Visit from 11/10/2020 in [facility] Video Visit from 10/06/2020 in [facility] Video Visit from 09[facility]08/2020 in [facility]  C-SSRS RISK CATEGORY No Risk No Risk No Risk        Assessment and Plan:  Paula FlavorsCrystal D [name] is a 40 y.o. year old female with a history of depression, anxiety, GERD, hypothyroidism, PCOS, who presents for follow up appointment for below.   1. MDD (major depressive disorder), recurrent, in partial remission (HCC) 2. PTSD (post-traumatic stress disorder) 3. GAD (generalized anxiety disorder) There has been significant improvement in depressive symptoms and irritability since up titration of venlafaxine.  Psychosocial stressors includes marital discordance, and being a caregiver of her children, childhood  trauma history.  Will continue current dose of venlafaxine to target depression, anxiety and PTSD.   4. Insomnia, unspecified type She has good benefit from trazodone.  Will continue current dose to target insomnia.   # Excessive yawning She reports excessive yawning over the past several months.  It is unclear whether this is attributable to venlafaxine/trazodone.  She agrees to try a lower dose of trazodone to see if this mitigates this symptom.  Noted that she will have an upcoming evaluation for menopause. She will update the result at the next visit.     Plan I have reviewed and updated plans as below  1. Continue venlafaxine 150 mg daily 2. Decrease Trazodone 25 mg at night as needed for sleep 3. Next appointment: 7/27 at 9:30 for 30 mins, video     The patient demonstrates the following risk factors for suicide: Chronic risk factors for suicide include: psychiatric disorder of depression, anxiety. Acute risk factors for suicide include: N/A. Protective factors for this patient include: positive social support, coping skills and hope for the future. Considering these factors, the overall suicide risk at this point appears to be low. Patient is appropriate for outpatient follow up.        Neysa Hottereina Chrishawn Kring, MD 11/10/2020, 10:30 AM

## 2020-11-10 ENCOUNTER — Other Ambulatory Visit: Payer: Self-pay

## 2020-11-10 ENCOUNTER — Telehealth (INDEPENDENT_AMBULATORY_CARE_PROVIDER_SITE_OTHER): Payer: No Typology Code available for payment source | Admitting: Psychiatry

## 2020-11-10 ENCOUNTER — Encounter: Payer: Self-pay | Admitting: Psychiatry

## 2020-11-10 DIAGNOSIS — F3341 Major depressive disorder, recurrent, in partial remission: Secondary | ICD-10-CM | POA: Diagnosis not present

## 2020-11-10 DIAGNOSIS — G4700 Insomnia, unspecified: Secondary | ICD-10-CM

## 2020-11-10 DIAGNOSIS — F411 Generalized anxiety disorder: Secondary | ICD-10-CM | POA: Diagnosis not present

## 2020-11-10 DIAGNOSIS — F4310 Post-traumatic stress disorder, unspecified: Secondary | ICD-10-CM | POA: Diagnosis not present

## 2020-11-10 NOTE — Patient Instructions (Signed)
1. Continue venlafaxine 150 mg daily 2. Decrease Trazodone 25 mg at night as needed for sleep 3. Next appointment: 7/27 at 9:30

## 2020-11-18 ENCOUNTER — Other Ambulatory Visit: Payer: Self-pay

## 2020-11-18 MED FILL — Levothyroxine Sodium Tab 75 MCG: ORAL | 30 days supply | Qty: 30 | Fill #2 | Status: AC

## 2020-11-23 ENCOUNTER — Other Ambulatory Visit: Payer: Self-pay

## 2020-11-23 ENCOUNTER — Ambulatory Visit (INDEPENDENT_AMBULATORY_CARE_PROVIDER_SITE_OTHER): Payer: No Typology Code available for payment source | Admitting: Cardiovascular Disease

## 2020-11-23 ENCOUNTER — Encounter: Payer: Self-pay | Admitting: Cardiovascular Disease

## 2020-11-23 VITALS — BP 112/82 | HR 62 | Ht 59.0 in | Wt 168.2 lb

## 2020-11-23 DIAGNOSIS — R079 Chest pain, unspecified: Secondary | ICD-10-CM

## 2020-11-23 DIAGNOSIS — R0602 Shortness of breath: Secondary | ICD-10-CM | POA: Diagnosis not present

## 2020-11-23 DIAGNOSIS — K219 Gastro-esophageal reflux disease without esophagitis: Secondary | ICD-10-CM

## 2020-11-23 NOTE — Patient Instructions (Signed)
Medication Instructions:  Your physician recommends that you continue on your current medications as directed. Please refer to the Current Medication list given to you today.  *If you need a refill on your cardiac medications before your next appointment, please call your pharmacy*   Lab Work: None ordered  If you have labs (blood work) drawn today and your tests are completely normal, you will receive your results only by: MyChart Message (if you have MyChart) OR A paper copy in the mail If you have any lab test that is abnormal or we need to change your treatment, we will call you to review the results.   Testing/Procedures: Your physician has requested that you have an echocardiogram. Echocardiography is a painless test that uses sound waves to create images of your heart. It provides your doctor with information about the size and shape of your heart and how well your heart's chambers and valves are working. This procedure takes approximately one hour. There are no restrictions for this procedure.  Your physician has requested that you have an exercise tolerance test. For further information please visit [email or web address] Please also follow instruction sheet, as given.    Follow-Up: At [facility], you and your health needs are our priority.  As part of our continuing mission to provide you with exceptional heart care, we have created designated Provider Care Teams.  These Care Teams include your primary Cardiologist (physician) and Advanced Practice Providers (APPs -  Physician Assistants and Nurse Practitioners) who all work together to provide you with the care you need, when you need it.  We recommend signing up for the patient portal called "MyChart".  Sign up information is provided on this After Visit Summary.  MyChart is used to connect with patients for Virtual Visits (Telemedicine).  Patients are able to view lab/test results, encounter notes, upcoming appointments, etc.   Non-urgent messages can be sent to your provider as well.   To learn more about what you can do with MyChart, go to [email or web address].    Your next appointment:   As needed  The format for your next appointment:   In Person  Provider:   You may see Lorine Bears, MD or one of the following Advanced Practice Providers on your designated Care Team:   Nicolasa Ducking, NP Eula Listen, PA-C Marisue Ivan, PA-C Cadence Fransico Michael, [place] Gillian Shields, NP   Other Instructions  Please follow the instructions below for the day ot your stress test.  - you may eat a light breakfast/ lunch prior to your procedure - no caffeine for 24 hours prior to your test (coffee, tea, soft drinks, or chocolate)  - no smoking/ vaping for 4 hours prior to your test - you may take your regular medications the day of your test - bring any inhalers with you to your test - wear comfortable clothing & tennis/ non-skid shoes to walk on the treadmill

## 2020-11-23 NOTE — Progress Notes (Signed)
Cardiology Office Note   Date:  11/23/2020   ID:  Paula [name], Paula [name] Nov 26, 1980, MRN [id number]  PCP:  Nira Retort  Cardiologist:   Lorine Bears, MD   Chief Complaint  Patient presents with   Other    Chest pain. Meds reviewed verbally with pt.      History of Present Illness: Paula [name] is a 40 y.o. female who was referred by Dr. Tobi Bastos for evaluation of chest pain.  She has no prior cardiac history.  She has known history of asthma, hyperlipidemia, anxiety disorder, GERD and thyroid disease. She reports being on omeprazole for 16 years.  She feels that her GERD symptoms are relatively controlled.  However, over the last few months, she experienced intermittent episodes of chest pain.  She describes substernal squeezing sensation in the upper chest area radiating to her neck and shoulders.  One episode lasted 3 hours.  Most of these episodes happen at rest and not with exertion.  She exercises on a regular basis and complains of mild exertional dyspnea but none of the episodes of chest pain where in the setting of exercise.  She has no orthopnea or PND.  No previous cardiac history. There is no family history of premature coronary artery disease but she does not know anything about her father's side.  She is not a smoker.  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    Past Medical History:  Diagnosis Date   Anemia    Asthma    exercise induced   BULIMIA 01/17/2008   Annotation: AGE 18 Qualifier: Diagnosis of  By: Lorrene Reid LPN, Wanda     Complication of anesthesia    vomited during last c/s   CPD (cephalo-pelvic disproportion) 12/22/2014   Cystic fibrosis carrier in second trimester, antepartum 2016   Depression    Eczema    GERD (gastroesophageal reflux disease)    chronic   Headache    h/o migraines   Hypothyroidism    Nausea    Oligomenorrhea 12/22/2014   PONV (postoperative nausea and vomiting)    Rhinitis, allergic     Past Surgical History:  Procedure  Laterality Date   CESAREAN SECTION  04/19/2013   LTCS; CPD   CESAREAN SECTION N/A 10/27/2015   Procedure: REPEAT CESAREAN SECTION;  Surgeon: Herold Harms, MD;  Location: [facility] ORS;  Service: Obstetrics;  Laterality: N/A;   ENDOSCOPIC TURBINATE REDUCTION Bilateral 07/26/2017   Procedure: ENDOSCOPIC INFERIOR  TURBINATE REDUCTION;  Surgeon: Vernie Murders, MD;  Location: [facility];  Service: ENT;  Laterality: Bilateral;   FRONTAL SINUS EXPLORATION Bilateral 07/26/2017   Procedure: FRONTAL SINUS EXPLORATION;  Surgeon: Vernie Murders, MD;  Location[facility];  Service: ENT;  Laterality: Bilateral;   IMAGE GUIDED SINUS SURGERY Bilateral 07/26/2017   Procedure: IMAGE GUIDED SINUS SURGERY;  Surgeon: Vernie Murders, MD;  Location: [facility];  Service: ENT;  Laterality: Bilateral;  GAVE DISK TO CECE 2-12   SEPTOPLASTY WITH ETHMOIDECTOMY, AND MAXILLARY ANTROSTOMY Bilateral 07/26/2017   Procedure: SEPTOPLASTY WITH TOTAL  ETHMOIDECTOMY, AND MAXILLARY ANTROSTOMYWITH REMOVAL OF TISSUE,;  Surgeon: Vernie Murders, MD;  Location: [facility];  Service: ENT;  Laterality: Bilateral;   TONSILLECTOMY     WISDOM TOOTH EXTRACTION       Current Outpatient Medications  Medication Sig Dispense Refill   albuterol (PROVENTIL HFA;VENTOLIN HFA) 108 (90 Base) MCG/ACT inhaler Inhale 2 puffs into the lungs every 6 (six) hours as needed for wheezing or shortness of breath.  cetirizine (ZYRTEC) 10 MG tablet Take 10 mg by mouth daily.     etonogestrel-ethinyl estradiol (NUVARING) 0.12-0.015 MG/24HR vaginal ring INSERT 1 RING VAGINALLY AND LEAVE IN PLACE FOR 3 CONSECUTIVE WEEKS, THEN REMOVE FOR 1 WEEK. 3 each 4   famotidine (PEPCID) 40 MG tablet Take 40 mg by mouth as needed for heartburn or indigestion.     levothyroxine (SYNTHROID) 75 MCG tablet TAKE 1 TABLET BY MOUTH DAILY BEFORE BREAKFAST 30 tablet 11   montelukast (SINGULAIR) 10 MG tablet Take 10 mg by mouth as needed.     omeprazole  (PRILOSEC) 40 MG capsule TAKE 1 CAPSULE (40 MG TOTAL) BY MOUTH IN THE MORNING AND AT BEDTIME. 180 capsule 1   traZODone (DESYREL) 50 MG tablet Take 1 tablet (50 mg total) by mouth at bedtime as needed for sleep. 90 tablet 0   triamcinolone (NASACORT) 55 MCG/ACT AERO nasal inhaler Place into the nose.     venlafaxine XR (EFFEXOR-XR) 150 MG 24 hr capsule Take 1 capsule (150 mg total) by mouth daily with breakfast. 90 capsule 0   No current facility-administered medications for this visit.    Allergies:   Iodine, Shellfish allergy, Contrast media [iodinated diagnostic agents], and Flagyl [metronidazole]    Social History:  The patient  reports that she has never smoked. She has never used smokeless tobacco. She reports current alcohol use of about 2.0 standard drinks of alcohol per week. She reports that she does not use drugs.   Family History:  The patient's family history includes Alcohol abuse in her mother; Bipolar disorder in her mother; Depression in her maternal grandmother; Endometriosis in her mother; Heart disease in her maternal grandmother.    ROS:  Please see the history of present illness.   Otherwise, review of systems are positive for none.   All other systems are reviewed and negative.    PHYSICAL EXAM: VS:  BP 112/82 (BP Location: Right Arm, Patient Position: Sitting, Cuff Size: Normal)   Pulse 62   Ht 4\' 11"  (1.499 m)   Wt 168 lb 4 oz (76.3 kg)   SpO2 98%   BMI 33.98 kg/m  , BMI Body mass index is 33.98 kg/m. GEN: Well nourished, well developed, in no acute distress  HEENT: normal  Neck: no JVD, carotid bruits, or masses Cardiac: RRR; no murmurs, rubs, or gallops,no edema  Respiratory:  clear to auscultation bilaterally, normal work of breathing GI: soft, nontender, nondistended, + BS MS: no deformity or atrophy  Skin: warm and dry, no rash Neuro:  Strength and sensation are intact Psych: euthymic mood, full affect   EKG:  EKG is ordered today. The ekg  ordered today demonstrates normal sinus rhythm with no significant ST or T wave changes.  There is sinus arrhythmia.   Recent Labs: 12/08/2019: ALT 30; BUN 21; Creatinine, Ser 0.89; Hemoglobin 13.6; Platelets 267; Potassium 3.9; Sodium 136 10/06/2020: TSH 2.070    Lipid Panel    Component Value Date/Time   CHOL 259 (H) 03/26/2019 1420   CHOL 170 09/25/2011 0801   TRIG 110 03/26/2019 1420   TRIG 85 09/25/2011 0801   HDL 62 03/26/2019 1420   HDL 40 09/25/2011 0801   CHOLHDL 4.2 03/26/2019 1420   VLDL 17 09/25/2011 0801   LDLCALC 178 (H) 03/26/2019 1420   LDLCALC 113 (H) 09/25/2011 0801      Wt Readings from Last 3 Encounters:  11/23/20 168 lb 4 oz (76.3 kg)  08/26/20 172 lb (78 kg)  04/28/20 163  lb 2 oz (74 kg)       PAD Screen 11/23/2020  Previous PAD dx? No  Previous surgical procedure? No  Pain with walking? No  Feet/toe relief with dangling? No  Painful, non-healing ulcers? No  Extremities discolored? No      ASSESSMENT AND PLAN:  1.  Atypical chest pain: Suspect likely related to esophageal spasm although she reports that her GERD symptoms are relatively well controlled.  Her EKG is normal.  I am going to obtain a treadmill stress test.  2.  Exertional dyspnea: She reports history of asthma which might be the culprit but will obtain an echocardiogram to ensure no structural heart abnormalities.  3.  GERD: Currently on omeprazole.  She reports controlled symptoms of chest pain might be related to this.  If cardiac work-up is negative, consider GI evaluation.    Disposition:   FU with me as needed  Signed,  Lorine Bears, MD  11/23/2020 4:53 PM    [facility]

## 2020-11-30 ENCOUNTER — Other Ambulatory Visit: Payer: Self-pay

## 2020-11-30 MED ORDER — MUPIROCIN 2 % EX OINT
TOPICAL_OINTMENT | CUTANEOUS | 0 refills | Status: DC
  Filled 2020-11-30: qty 22, 7d supply, fill #0

## 2020-12-21 ENCOUNTER — Other Ambulatory Visit: Payer: Self-pay

## 2020-12-21 MED FILL — Levothyroxine Sodium Tab 75 MCG: ORAL | 30 days supply | Qty: 30 | Fill #3 | Status: AC

## 2021-01-05 IMAGING — CT CT ABD-PELV W/O CM
2 of 4 series · 16 of 46 positions shown, 18 images · non-contrast
Comparison: None.

CLINICAL DATA: Abdominal pain with gastrointestinal bleeding

EXAM:
CT ABDOMEN AND PELVIS WITHOUT CONTRAST
TECHNIQUE: Multidetector CT imaging of the abdomen and pelvis was performed
following the standard protocol without oral or IV contrast.

[Series 2: routine abd/pel wo · axial · 0.71mm/px · z∈[-331,+54]mm · 13 of 85 slices shown, 15 images]
[im 4/85  soft-tissue]
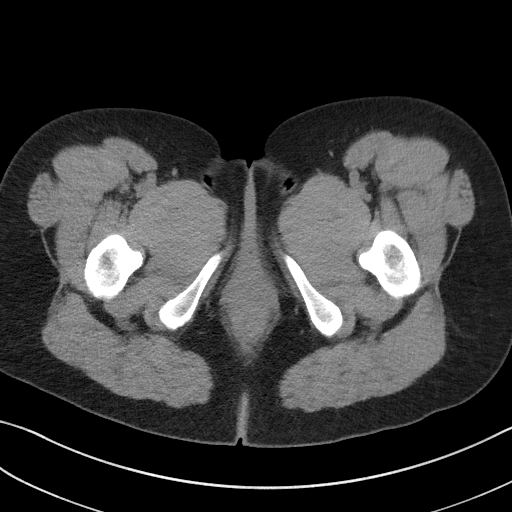
[im 4/85  bone]
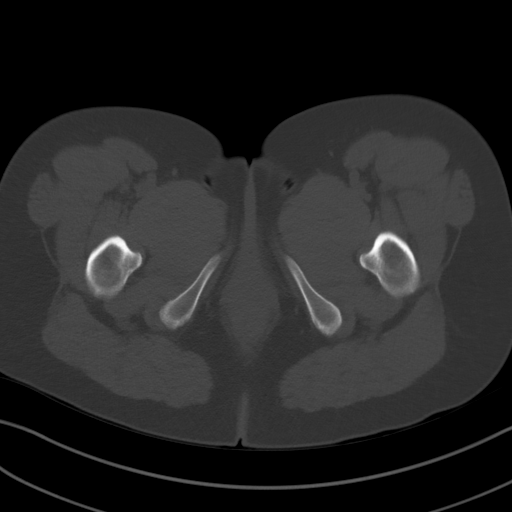
[im 11/85  soft-tissue]
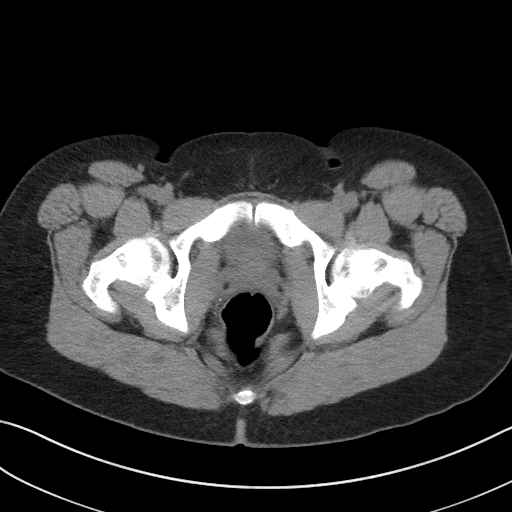
[im 18/85  soft-tissue]
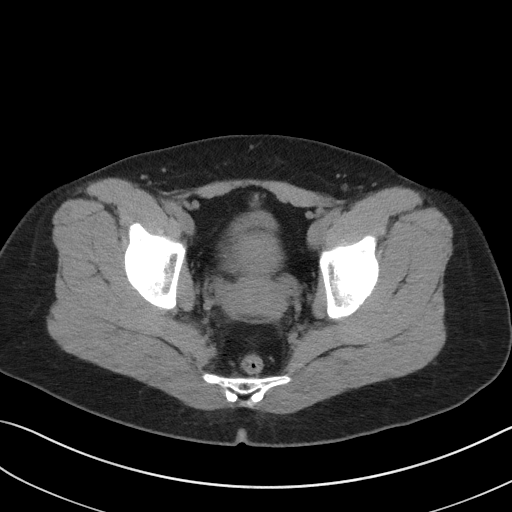
[im 25/85  soft-tissue]
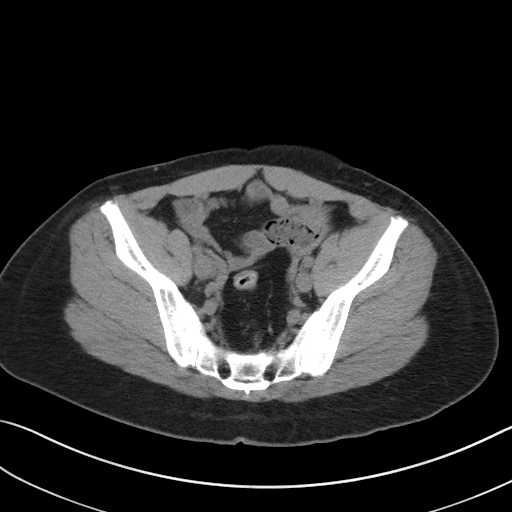
[im 29/85  soft-tissue]
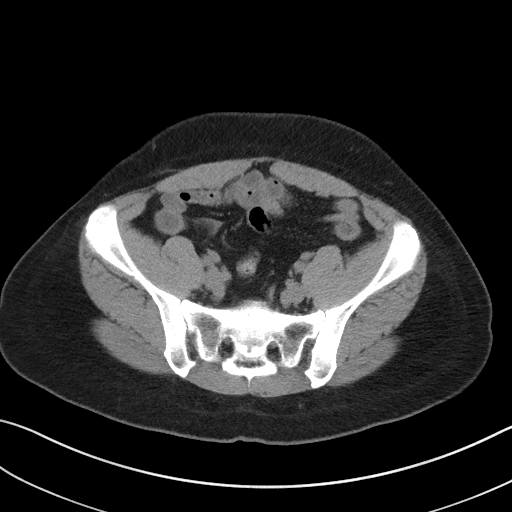
[im 36/85  soft-tissue]
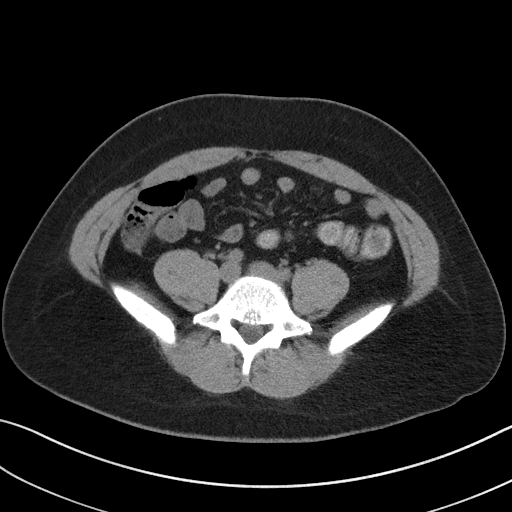
[im 43/85  soft-tissue]
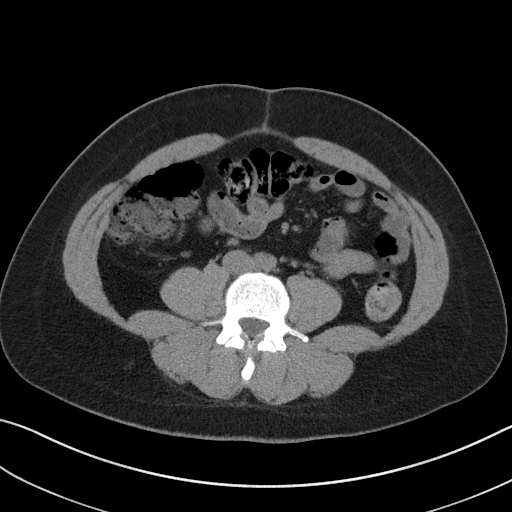
[im 50/85  soft-tissue]
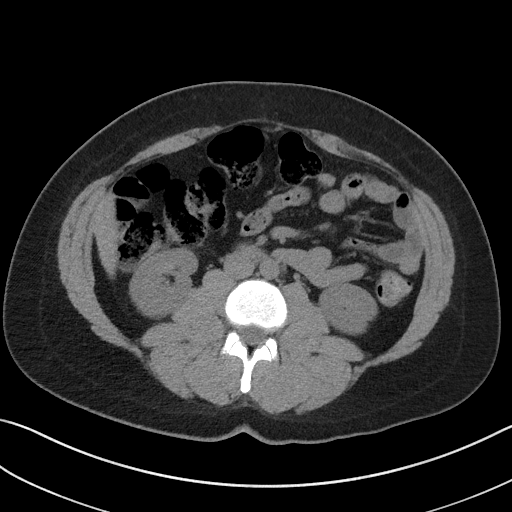
[im 57/85  soft-tissue]
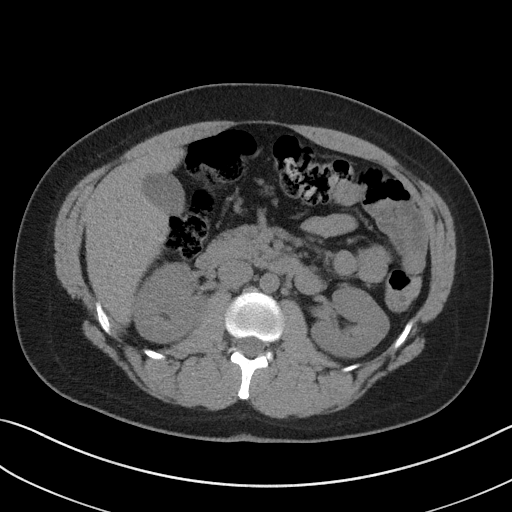
[im 57/85  bone]
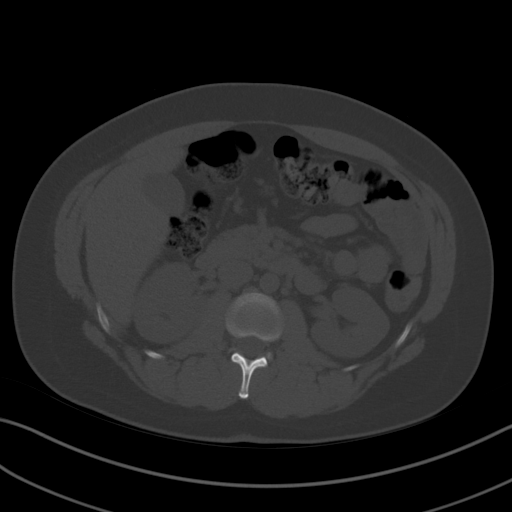
[im 60/85  soft-tissue]
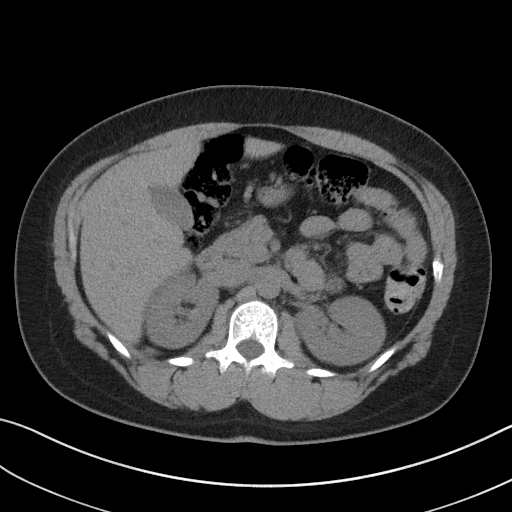
[im 67/85  soft-tissue]
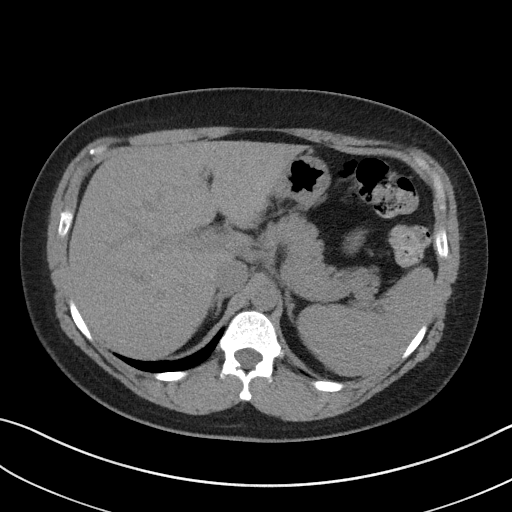
[im 74/85  soft-tissue]
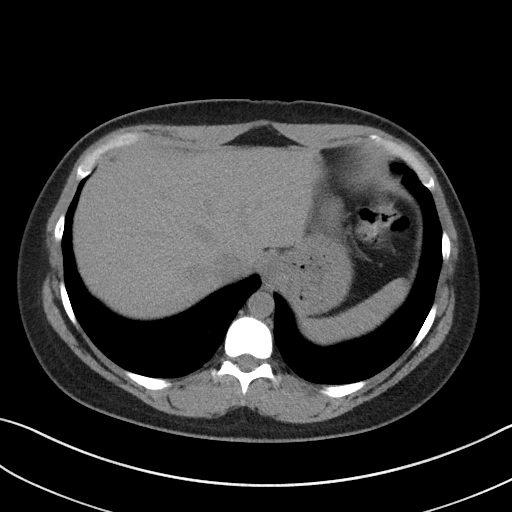
[im 81/85  soft-tissue]
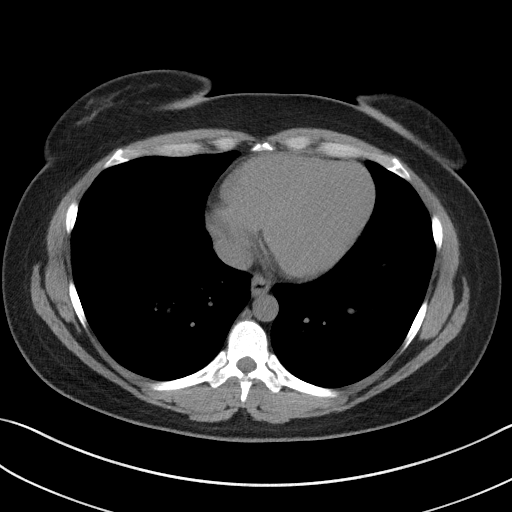

[Series 5: coronal st · coronal · 0.74mm/px · 3 of 89 slices shown]
[im 30/89  soft-tissue]
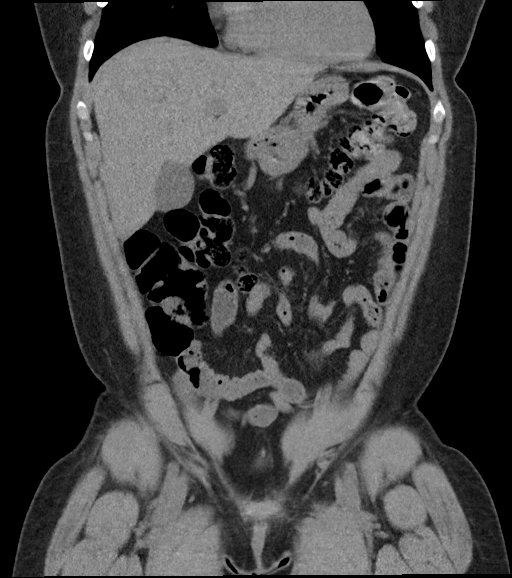
[im 40/89  soft-tissue]
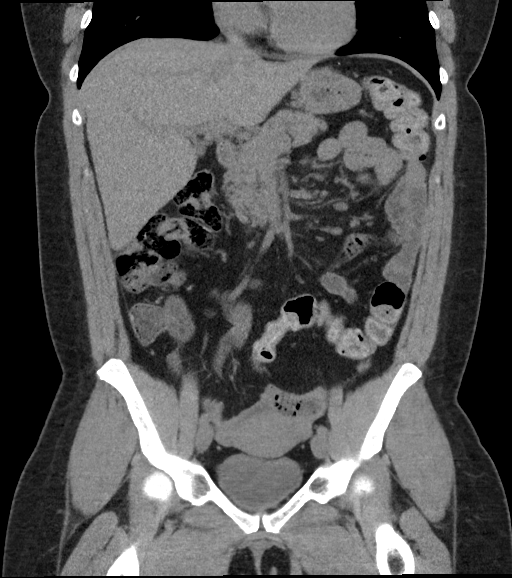
[im 49/89  soft-tissue]
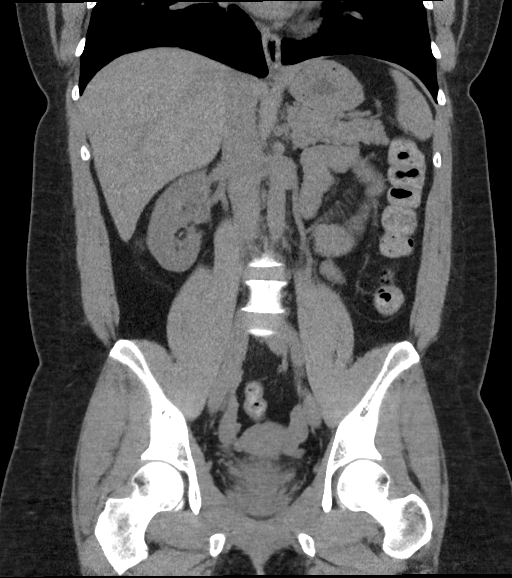

[16 of 46 positions shown; findings below may reference images not displayed]

FINDINGS: Lower chest: Lung bases are clear.

Hepatobiliary: No focal liver lesions are appreciable on this
noncontrast enhanced study. Gallbladder wall is not appreciably
thickened. There is no biliary duct dilatation.

Pancreas: There is no pancreatic mass or inflammatory focus.

Spleen: No splenic lesions are evident.

Adrenals/Urinary Tract: Adrenals bilaterally appear normal. Kidneys
bilaterally show no evident mass or hydronephrosis on either side.
There is no evident renal or ureteral calculus on either side.
Urinary bladder is midline with wall thickness within normal limits.

Stomach/Bowel: There is moderate stool in the colon. There is no
appreciable bowel wall or mesenteric thickening. Terminal ileum
appears normal. There is no evident bowel obstruction. There is no
appreciable free air or portal venous air.

Vascular/Lymphatic: There is no abdominal aortic aneurysm. No
vascular lesions are appreciable on this noncontrast enhanced study.
There is no evident adenopathy by size criteria in the abdomen or
pelvis. Occasional subcentimeter inguinal lymph nodes are considered
nonspecific.

Reproductive: Uterus is anteverted.  No evident pelvic mass.

Other: No periappendiceal region inflammation evident. Appendix is
diminutive and appears unremarkable. No abscess or ascites is
evident in the abdomen or pelvis.

Musculoskeletal: No blastic or lytic bone lesions. There is no
intramuscular or abdominal wall lesion.
IMPRESSION: 1. A cause for patient's symptoms has not been established with this
study.

2. No bowel wall thickening or bowel obstruction. No abscess in the
abdomen or pelvis. Appendix region appears normal.

3. No evident renal or ureteral calculus. No hydronephrosis. Urinary
bladder wall thickness is normal.

These results will be called to the ordering clinician or
representative by the [HOSPITAL] at the imaging location.

## 2021-01-10 ENCOUNTER — Other Ambulatory Visit: Payer: Self-pay | Admitting: Psychiatry

## 2021-01-10 ENCOUNTER — Other Ambulatory Visit: Payer: Self-pay

## 2021-01-10 ENCOUNTER — Telehealth: Payer: Self-pay | Admitting: Psychiatry

## 2021-01-10 MED ORDER — VENLAFAXINE HCL ER 150 MG PO CP24
150.0000 mg | ORAL_CAPSULE | Freq: Every day | ORAL | 0 refills | Status: DC
  Filled 2021-01-10: qty 90, 90d supply, fill #0

## 2021-01-10 NOTE — Telephone Encounter (Signed)
Ordered a refill of venlafaxine per request from the pharmacy. Could you contact the patient and make a follow up for 30 mins slot. Thanks.

## 2021-01-10 NOTE — Telephone Encounter (Signed)
Received a call about her concern of running out of medication, and not being able to touch base with [place] despite leaving voice messages. She was reportedly on video call in July with the thought that the appointment was scheduled.  Although this clinician do remember it was discussed at her last visit, her appointment was not scheduled in Epic for some reason. This clinician apologized about the trouble she had due to follow-up appointment not being scheduled in Epic for some reason.  Will look into phone system due to the trouble she had this morning.  She is planning to pick up venlafaxine, which was ordered this morning.  The next appointment will be scheduled on 8/31 at 8 Am, video. The front desk is notified about this.

## 2021-01-14 ENCOUNTER — Telehealth: Payer: Self-pay | Admitting: Cardiovascular Disease

## 2021-01-14 NOTE — Telephone Encounter (Signed)
Patient Scheduled ETT 9/2 .     Patient wants to ask about need for echo.    She discuss sob during rigorous exercise class but doesn't have sob in general.  Patient wants to make sure this was correctly understood by Dr. Kirke Corin and find out if he still wants echo.

## 2021-01-14 NOTE — Telephone Encounter (Signed)
I spoke with the patient. She is scheduled for an upcoming GXT & echocardiogram to be done for shortness of breath.  Her concern is the potential cost of the echo.  I have advised her the reasoning for the 2 separate tests being ordered. I have also advised her to contact her insurance to discuss what her potential out of pocket cost will be.  Per Mignon Pine- front desk rep, she was able to obtain [place] 40352 for the echo. I have given this code to the patient and advised her that her out of pocket will include whatever her co-pay for testing is for her insurance, but outside of that, I am unsure.  She is advised to reach out to [facility] for a better estimated cost.   I have advised the patient she can decline to have the echo done, but she advised that her heart has to be ruled out as a cause for her SOB in order for her reflux to be treated with a PPI.   I have advised the patient that she can follow up with [place] regarding what she finds out from [place] if needed.   The patient voices understanding and is agreeable.

## 2021-01-25 ENCOUNTER — Other Ambulatory Visit: Payer: Self-pay

## 2021-01-25 MED FILL — Levothyroxine Sodium Tab 75 MCG: ORAL | 30 days supply | Qty: 30 | Fill #4 | Status: AC

## 2021-01-25 NOTE — Progress Notes (Signed)
Virtual Visit via Video Note  I connected with Paula [name] on 01/26/21 at  8:00 AM EDT by a video enabled telemedicine application and verified that I am speaking with the correct person using two identifiers.  Location: Patient: home Provider: office Persons participated in the visit- patient, provider    I discussed the limitations of evaluation and management by telemedicine and the availability of in person appointments. The patient expressed understanding and agreed to proceed.    I discussed the assessment and treatment plan with the patient. The patient was provided an opportunity to ask questions and all were answered. The patient agreed with the plan and demonstrated an understanding of the instructions.   The patient was advised to call back or seek an in-person evaluation if the symptoms worsen or if the condition fails to improve as anticipated.  I provided 16 minutes of non-face-to-face time during this encounter.   Neysa Hotter, MD    [facility] MD/PA/NP OP Progress Note  01/26/2021 8:28 AM Paula MCCLEOD  MRN:  [id number]  Chief Complaint:  Chief Complaint   Follow-up; Depression    HPI:  This is a follow-up appointment for depression.  She states that she has been feeling much better since she has been off in the morning.  She does not have mood swing, and does not feel she is on egg shell  She feels calmer when she talks with her younger daughter, and enjoys interaction with her and her oldest daughter.  She does exercise 5 times a week.  She is also hoping to join the choir.  She used to enjoy doing it, although it was difficult to do so lately due to schedule.  Her husband is supportive of this.  It means a lot to her. She is planning to talk with her paternal aunt, who she recently found out. She talks with her paternal uncle regularly.  She denies feeling depressed or anhedonia.  She does not have feeling any more, and has good energy.  She feels good about  weight loss since being working on diet and an exercise.  She denies SI.  She sleeps better except the time her daughter crawling in the bed.  She denies anxiety or panic attacks.  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 Her parents were never married. She had estranged relationship with her biological father since she was a child.  Visit Diagnosis:    ICD-10-CM   1. MDD (major depressive disorder), recurrent, in partial remission (HCC)  F33.41     2. PTSD (post-traumatic stress disorder)  F43.10     3. GAD (generalized anxiety disorder)  F41.1     4. Insomnia, unspecified type  G47.00       Past Psychiatric History: Please see initial evaluation for full details. I have reviewed the history. No updates at this time.     Past Medical History:  Past Medical History:  Diagnosis Date   Anemia    Asthma    exercise induced   BULIMIA 01/17/2008   Annotation: AGE 8 Qualifier: Diagnosis of  By: Combs LPN,  Wanda     Complication of anesthesia    vomited during last c/s   CPD (cephalo-pelvic disproportion) 12/22/2014   Cystic fibrosis carrier in second trimester, antepartum 2016   Depression    Eczema    GERD (gastroesophageal reflux disease)    chronic   Headache    h/o migraines   Hypothyroidism    Nausea    Oligomenorrhea 12/22/2014   PONV (postoperative nausea and vomiting)    Rhinitis, allergic     Past Surgical History:  Procedure Laterality Date   CESAREAN SECTION  04/19/2013   LTCS; CPD   CESAREAN SECTION N/A 10/27/2015   Procedure: REPEAT CESAREAN SECTION;  Surgeon: Herold HarmsMartin A Defrancesco, MD;  Location: [facility];  Service:  Obstetrics;  Laterality: N/A;   ENDOSCOPIC TURBINATE REDUCTION Bilateral 07/26/2017   Procedure: ENDOSCOPIC INFERIOR  TURBINATE REDUCTION;  Surgeon: Vernie MurdersJuengel, Paul, MD;  [facility];  Service: ENT;  Laterality: Bilateral;   FRONTAL SINUS EXPLORATION Bilateral 07/26/2017   Procedure: FRONTAL SINUS EXPLORATION;  Surgeon: Vernie MurdersJuengel, Paul, MD;  [facility];  Service: ENT;  Laterality: Bilateral;   IMAGE GUIDED SINUS SURGERY Bilateral 07/26/2017   Procedure: IMAGE GUIDED SINUS SURGERY;  Surgeon: Vernie MurdersJuengel, Paul, MD;  [facility];  Service: ENT;  Laterality: Bilateral;  GAVE DISK TO CECE 2-12   SEPTOPLASTY WITH ETHMOIDECTOMY, AND MAXILLARY ANTROSTOMY Bilateral 07/26/2017   Procedure: SEPTOPLASTY WITH TOTAL  ETHMOIDECTOMY, AND MAXILLARY ANTROSTOMYWITH REMOVAL OF TISSUE,;  Surgeon: Vernie MurdersJuengel, Paul, MD;  [facility];  Service: ENT;  Laterality: Bilateral;   TONSILLECTOMY     WISDOM TOOTH EXTRACTION      Family Psychiatric History: Please see initial evaluation for full details. I have reviewed the history. No updates at this time.     Family History:  Family History  Problem Relation Age of Onset   Endometriosis Mother    Bipolar disorder Mother    Alcohol abuse Mother    Heart disease Maternal Grandmother        valvular problem   Depression Maternal Grandmother    Breast cancer Neg Hx    Ovarian cancer Neg Hx    Colon cancer Neg Hx     Social History:  Social History   Socioeconomic History   Marital status: Married    Spouse name: Not on file   Number of children: Not on file   Years of education: Not on file   Highest education level: Not on file  Occupational History   Occupation: ED tech    Employer: [employer]  Tobacco Use   Smoking status: Never   Smokeless tobacco: Never  Vaping Use   Vaping Use: Never used  Substance and Sexual Activity   Alcohol use: Yes    Alcohol/week: 2.0 standard drinks    Types: 2 Standard  drinks or equivalent per week    Comment: Social   Drug use: No   Sexual activity: Yes    Partners: Male    Birth control/protection: Inserts  Other Topics Concern   Not on file  Social History Narrative   Not on file   Social Determinants of Health   Financial Resource Strain: Not on file  Food Insecurity: Not on file  Transportation Needs: Not on file  Physical Activity: Not on file  Stress: Not on file  Social Connections: Not on file    Allergies:  Allergies  Allergen Reactions   Iodine Anaphylaxis    Contrast    Shellfish Allergy Hives  Contrast Media [Iodinated Diagnostic Agents]    Flagyl [Metronidazole]     Metabolic Disorder Labs: Lab Results  Component Value Date   HGBA1C 5.4 03/26/2019   Lab Results  Component Value Date   PROLACTIN 35.8 (H) 03/14/2016   PROLACTIN 43.4 12/27/2007   Lab Results  Component Value Date   CHOL 259 (H) 03/26/2019   TRIG 110 03/26/2019   HDL 62 03/26/2019   CHOLHDL 4.2 03/26/2019   VLDL 17 09/25/2011   LDLCALC 178 (H) 03/26/2019   LDLCALC 154 (H) 03/20/2017   Lab Results  Component Value Date   TSH 2.070 10/06/2020   TSH 3.900 12/08/2019    Therapeutic Level Labs: No results found for: LITHIUM No results found for: VALPROATE No components found for:  CBMZ  Current Medications: Current Outpatient Medications  Medication Sig Dispense Refill   albuterol (PROVENTIL HFA;VENTOLIN HFA) 108 (90 Base) MCG/ACT inhaler Inhale 2 puffs into the lungs every 6 (six) hours as needed for wheezing or shortness of breath.     cetirizine (ZYRTEC) 10 MG tablet Take 10 mg by mouth daily.     etonogestrel-ethinyl estradiol (NUVARING) 0.12-0.015 MG/24HR vaginal ring INSERT 1 RING VAGINALLY AND LEAVE IN PLACE FOR 3 CONSECUTIVE WEEKS, THEN REMOVE FOR 1 WEEK. 3 each 4   famotidine (PEPCID) 40 MG tablet Take 40 mg by mouth as needed for heartburn or indigestion.     levothyroxine (SYNTHROID) 75 MCG tablet TAKE 1 TABLET BY MOUTH DAILY  BEFORE BREAKFAST 30 tablet 11   montelukast (SINGULAIR) 10 MG tablet Take 10 mg by mouth as needed.     mupirocin ointment (BACTROBAN) 2 % Apply topically 3 (three) times daily for 7 days 22 g 0   omeprazole (PRILOSEC) 40 MG capsule TAKE 1 CAPSULE (40 MG TOTAL) BY MOUTH IN THE MORNING AND AT BEDTIME. 180 capsule 1   traZODone (DESYREL) 50 MG tablet Take 0.5 tablets (25 mg total) by mouth at bedtime as needed for sleep. 45 tablet 1   triamcinolone (NASACORT) 55 MCG/ACT AERO nasal inhaler Place into the nose.     venlafaxine XR (EFFEXOR-XR) 150 MG 24 hr capsule Take 1 capsule (150 mg total) by mouth daily with breakfast. 90 capsule 0   No current facility-administered medications for this visit.     Musculoskeletal: Strength & Muscle Tone:  N/A Gait & Station:  N/A Patient leans: N/A  Psychiatric Specialty Exam: Review of Systems  Psychiatric/Behavioral:  Positive for sleep disturbance. Negative for agitation, behavioral problems, confusion, decreased concentration, dysphoric mood, hallucinations, self-injury and suicidal ideas. The patient is not nervous/anxious and is not hyperactive.   All other systems reviewed and are negative.  currently breastfeeding.There is no height or weight on file to calculate BMI.  General Appearance: Fairly Groomed  Eye Contact:  Good  Speech:  Clear and Coherent  Volume:  Normal  Mood:   better  Affect:  Appropriate, Congruent, and euthymic  Thought Process:  Coherent  Orientation:  Full (Time, Place, and Person)  Thought Content: Logical   Suicidal Thoughts:  No  Homicidal Thoughts:  No  Memory:  Immediate;   Good  Judgement:  Good  Insight:  Good  Psychomotor Activity:  Normal  Concentration:  Concentration: Good and Attention Span: Good  Recall:  Good  Fund of Knowledge: Good  Language: Good  Akathisia:  No  Handed:  Right  AIMS (if indicated): not done  Assets:  Communication Skills Desire for Improvement  ADL's:  Intact  Cognition:  WNL  Sleep:  Fair   Screenings: GAD-7    Flowsheet Row Office Visit from 06/16/2020 in [facility] Office Visit from 04/28/2020 in [facility]  Total GAD-7 Score 7 8      PHQ2-9    Flowsheet Row Video Visit from 01/26/2021 in [facility] Video Visit from 11/10/2020 in [facility] Video Visit from 10/06/2020 in [facility] Video Visit from 09/08/2020 in [facility] Video Visit from 07/28/2020 in [facility]  PHQ-2 Total Score 0 0 1 0 2  PHQ-9 Total Score -- -- -- -- 15      Flowsheet Row Video Visit from 11/10/2020 in [facility] Video Visit from 10/06/2020 in [facility] Video Visit from 09/08/2020 in [facility]  C-SSRS RISK CATEGORY No Risk No Risk No Risk        Assessment and Plan:  JANAYE CORP is a 40 y.o. year old female with a history of depression, anxiety, GERD, hypothyroidism, PCOS, who presents for follow up appointment for below.   1. MDD (major depressive disorder), recurrent, in partial remission (HCC) 2. PTSD (post-traumatic stress disorder) 3. GAD (generalized anxiety disorder) There has been overall improvement in her mood symptoms, which coincided with taking off Nuvaring for the past few months.  Psychosocial stressors includes marital conflict, being a caregiver of her children, and childhood trauma history.  Will continue current dose of venlafaxine to target depression, anxiety and PTSD.   4. Insomnia, unspecified type There has been overall improvement.  She reports good benefit from trazodone.  Will continue current dose to target insomnia.     Plan I have reviewed and updated plans as below  1. Continue venlafaxine 150 mg daily 2. Continue Trazodone 25 mg at night as needed for sleep 3. Next appointment:  11/2 at 10 AM , video     The patient demonstrates the following risk factors for suicide: Chronic risk factors for suicide include: psychiatric disorder of depression, anxiety. Acute risk factors for suicide include: N/A. Protective factors for this patient include: positive social support, coping skills and hope for the future. Considering these factors, the overall suicide risk at this point appears to be low. Patient is appropriate for outpatient follow up.           Neysa Hotter, MD 01/26/2021, 8:28 AM

## 2021-01-26 ENCOUNTER — Other Ambulatory Visit: Payer: Self-pay

## 2021-01-26 ENCOUNTER — Encounter: Payer: Self-pay | Admitting: Psychiatry

## 2021-01-26 ENCOUNTER — Telehealth (INDEPENDENT_AMBULATORY_CARE_PROVIDER_SITE_OTHER): Payer: No Typology Code available for payment source | Admitting: Psychiatry

## 2021-01-26 DIAGNOSIS — G4700 Insomnia, unspecified: Secondary | ICD-10-CM | POA: Diagnosis not present

## 2021-01-26 DIAGNOSIS — F411 Generalized anxiety disorder: Secondary | ICD-10-CM

## 2021-01-26 DIAGNOSIS — F4310 Post-traumatic stress disorder, unspecified: Secondary | ICD-10-CM | POA: Diagnosis not present

## 2021-01-26 DIAGNOSIS — F3341 Major depressive disorder, recurrent, in partial remission: Secondary | ICD-10-CM | POA: Diagnosis not present

## 2021-01-26 MED ORDER — TRAZODONE HCL 50 MG PO TABS
25.0000 mg | ORAL_TABLET | Freq: Every evening | ORAL | 1 refills | Status: DC | PRN
  Filled 2021-01-26: qty 45, 90d supply, fill #0

## 2021-01-26 NOTE — Patient Instructions (Signed)
1. Continue venlafaxine 150 mg daily 2. Continue Trazodone 25 mg at night as needed for sleep 3. Next appointment: 11/2 at 10 AM

## 2021-01-27 ENCOUNTER — Telehealth: Payer: Self-pay

## 2021-01-27 NOTE — Telephone Encounter (Signed)
Spoke with the patient. Patient given verbal pre-test gxt instructions. Confirmed the appt for tomorrow 01/28/21 @ 3pm. Adv the patient to arrive 15 min prior to allow fr check in.  - you may eat a light breakfast/ lunch prior to your procedure - no caffeine for 24 hours prior to your test (coffee, tea, soft drinks, or chocolate)  - no smoking/ vaping for 4 hours prior to your test - you may take your regular medications the day of your test. - bring any inhalers with you to your test - wear comfortable clothing & tennis/ non-skid shoes to walk on the treadmill  Patient verbalized understanding to the instructions given.  Patient did want her otc medication NeoCell (collagen type 1, vitamin c, vitamin d3) added to her med list.

## 2021-01-28 ENCOUNTER — Ambulatory Visit (INDEPENDENT_AMBULATORY_CARE_PROVIDER_SITE_OTHER): Payer: No Typology Code available for payment source

## 2021-01-28 ENCOUNTER — Other Ambulatory Visit: Payer: Self-pay

## 2021-01-28 DIAGNOSIS — R079 Chest pain, unspecified: Secondary | ICD-10-CM

## 2021-01-28 DIAGNOSIS — R0602 Shortness of breath: Secondary | ICD-10-CM | POA: Diagnosis not present

## 2021-02-01 LAB — EXERCISE TOLERANCE TEST
Angina Index: 0
Base ST Depression (mm): 0 mm
Duke Treadmill Score: 10
Estimated workload: 11.8
Exercise duration (min): 10 min
Exercise duration (sec): 5 s
MPHR: 182 {beats}/min
Peak HR: 173 {beats}/min
Percent HR: 95 %
Rest HR: 71 {beats}/min
ST Depression (mm): 0 mm

## 2021-02-25 ENCOUNTER — Other Ambulatory Visit: Payer: Self-pay

## 2021-02-25 MED ORDER — CIPROFLOXACIN HCL 250 MG PO TABS
ORAL_TABLET | ORAL | 0 refills | Status: DC
  Filled 2021-02-25: qty 10, 5d supply, fill #0

## 2021-02-25 MED FILL — Levothyroxine Sodium Tab 75 MCG: ORAL | 30 days supply | Qty: 30 | Fill #5 | Status: AC

## 2021-03-09 ENCOUNTER — Other Ambulatory Visit (HOSPITAL_COMMUNITY): Payer: Self-pay

## 2021-03-10 ENCOUNTER — Other Ambulatory Visit (HOSPITAL_COMMUNITY): Payer: Self-pay

## 2021-03-28 NOTE — Progress Notes (Signed)
Virtual Visit via Video Note  I connected with Paula [name] on 03/30/21 at 10:00 AM EDT by a video enabled telemedicine application and verified that I am speaking with the correct person using two identifiers.  Location: Patient: home Provider: office Persons participated in the visit- patient, provider    I discussed the limitations of evaluation and management by telemedicine and the availability of in person appointments. The patient expressed understanding and agreed to proceed.    I discussed the assessment and treatment plan with the patient. The patient was provided an opportunity to ask questions and all were answered. The patient agreed with the plan and demonstrated an understanding of the instructions.   The patient was advised to call back or seek an in-person evaluation if the symptoms worsen or if the condition fails to improve as anticipated.  I provided 22 minutes of non-face-to-face time during this encounter.   Neysa Hotter, MD    [facility] MD/PA/NP OP Progress Note  03/30/2021 10:37 AM Paula [name]  MRN:  [id number]  Chief Complaint:  Chief Complaint   Depression; Follow-up    HPI:  This is a follow-up appointment for depression and PTSD.  She states that her younger daughter is making poor choices.  She has been mean to her friends, or talk about to her teacher.  She was advised to consider an option of her child seen a therapist, or try family therapy.  She talks about the conflict with her friend, who she found out was having an affair for the past several months.  She felt betrayed by this, referring to her trauma in the past.  It has been difficult for her, referring that her children loves seeing this friends as well.  She loves her job.  She reports great support from her supervisor, who is very understanding.  She enjoys choir.  She enjoys decoration as this is her favorite season.  She was told by her husband that she seems to be doing better since  she is off NuvaRing.  She has occasional insomnia, and takes trazodone with good benefit.  She denies feeling depressed or anhedonia.  She notices that she is extremely stressful and anxious when she forgot to take venlafaxine. She occasionally has difficulty in concentration.  She denies change in appetite or weight.  She denies SI.     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 Her parents were never married. She had estranged relationship with her biological father since she was a child.  Visit Diagnosis:    ICD-10-CM   1. MDD (major depressive disorder), recurrent, in partial remission (HCC)  F33.41     2. PTSD (post-traumatic stress disorder)  F43.10     3. GAD (generalized anxiety disorder)  F41.1     4. Insomnia, unspecified type  G47.00       Past Psychiatric History: Please see initial evaluation for full details. I have reviewed the history. No updates at this time.     Past Medical History:  Past Medical History:  Diagnosis Date   Anemia  Asthma    exercise induced   BULIMIA 01/17/2008   Annotation: AGE 7 Qualifier: Diagnosis of  By: Lorrene Reid LPN, Wanda     Complication of anesthesia    vomited during last c/s   CPD (cephalo-pelvic disproportion) 12/22/2014   Cystic fibrosis carrier in second trimester, antepartum 2016   Depression    Eczema    GERD (gastroesophageal reflux disease)    chronic   Headache    h/o migraines   Hypothyroidism    Nausea    Oligomenorrhea 12/22/2014   PONV (postoperative nausea and vomiting)    Rhinitis, allergic     Past Surgical History:  Procedure Laterality Date    CESAREAN SECTION  04/19/2013   LTCS; CPD   CESAREAN SECTION N/A 10/27/2015   Procedure: REPEAT CESAREAN SECTION;  Surgeon: Herold Harms, MD;  Location: [facility];  Service: Obstetrics;  Laterality: N/A;   ENDOSCOPIC TURBINATE REDUCTION Bilateral 07/26/2017   Procedure: ENDOSCOPIC INFERIOR  TURBINATE REDUCTION;  Surgeon: Vernie Murders, MD;  Location: [facility];  Service: ENT;  Laterality: Bilateral;   FRONTAL SINUS EXPLORATION Bilateral 07/26/2017   Procedure: FRONTAL SINUS EXPLORATION;  Surgeon: Vernie Murders, MD;  Location: [facility];  Service: ENT;  Laterality: Bilateral;   IMAGE GUIDED SINUS SURGERY Bilateral 07/26/2017   Procedure: IMAGE GUIDED SINUS SURGERY;  Surgeon: Vernie Murders, MD;  Location: [facility];  Service: ENT;  Laterality: Bilateral;  GAVE DISK TO CECE 2-12   SEPTOPLASTY WITH ETHMOIDECTOMY, AND MAXILLARY ANTROSTOMY Bilateral 07/26/2017   Procedure: SEPTOPLASTY WITH TOTAL  ETHMOIDECTOMY, AND MAXILLARY ANTROSTOMYWITH REMOVAL OF TISSUE,;  Surgeon: Vernie Murders, MD;  Location: [facility];  Service: ENT;  Laterality: Bilateral;   TONSILLECTOMY     WISDOM TOOTH EXTRACTION      Family Psychiatric History: Please see initial evaluation for full details. I have reviewed the history. No updates at this time.     Family History:  Family History  Problem Relation Age of Onset   Endometriosis Mother    Bipolar disorder Mother    Alcohol abuse Mother    Heart disease Maternal Grandmother        valvular problem   Depression Maternal Grandmother    Breast cancer Neg Hx    Ovarian cancer Neg Hx    Colon cancer Neg Hx     Social History:  Social History   Socioeconomic History   Marital status: Married    Spouse name: Not on file   Number of children: Not on file   Years of education: Not on file   Highest education level: Not on file  Occupational History   Occupation: ED tech    Employer: [employer]  Tobacco Use   Smoking status:  Never   Smokeless tobacco: Never  Vaping Use   Vaping Use: Never used  Substance and Sexual Activity   Alcohol use: Yes    Alcohol/week: 2.0 standard drinks    Types: 2 Standard drinks or equivalent per week    Comment: Social   Drug use: No   Sexual activity: Yes    Partners: Male    Birth control/protection: Inserts  Other Topics Concern   Not on file  Social History Narrative   Not on file   Social Determinants of Health   Financial Resource Strain: Not on file  Food Insecurity: Not on file  Transportation Needs: Not on file  Physical Activity: Not on file  Stress: Not on file  Social Connections: Not on file  Allergies:  Allergies  Allergen Reactions   Iodine Anaphylaxis    Contrast    Shellfish Allergy Hives   Contrast Media [Iodinated Diagnostic Agents]    Flagyl [Metronidazole]     Metabolic Disorder Labs: Lab Results  Component Value Date   HGBA1C 5.4 03/26/2019   Lab Results  Component Value Date   PROLACTIN 35.8 (H) 03/14/2016   PROLACTIN 43.4 12/27/2007   Lab Results  Component Value Date   CHOL 259 (H) 03/26/2019   TRIG 110 03/26/2019   HDL 62 03/26/2019   CHOLHDL 4.2 03/26/2019   VLDL 17 09/25/2011   LDLCALC 178 (H) 03/26/2019   LDLCALC 154 (H) 03/20/2017   Lab Results  Component Value Date   TSH 2.070 10/06/2020   TSH 3.900 12/08/2019    Therapeutic Level Labs: No results found for: LITHIUM No results found for: VALPROATE No components found for:  CBMZ  Current Medications: Current Outpatient Medications  Medication Sig Dispense Refill   albuterol (PROVENTIL HFA;VENTOLIN HFA) 108 (90 Base) MCG/ACT inhaler Inhale 2 puffs into the lungs every 6 (six) hours as needed for wheezing or shortness of breath.     cetirizine (ZYRTEC) 10 MG tablet Take 10 mg by mouth daily.     ciprofloxacin (CIPRO) 250 MG tablet Take 1 tablet (250 mg total) by mouth 2 (two) times daily for 5 days 10 tablet 0   etonogestrel-ethinyl estradiol (NUVARING)  0.12-0.015 MG/24HR vaginal ring INSERT 1 RING VAGINALLY AND LEAVE IN PLACE FOR 3 CONSECUTIVE WEEKS, THEN REMOVE FOR 1 WEEK. 3 each 4   famotidine (PEPCID) 40 MG tablet Take 40 mg by mouth as needed for heartburn or indigestion.     levothyroxine (SYNTHROID) 75 MCG tablet TAKE 1 TABLET BY MOUTH DAILY BEFORE BREAKFAST 30 tablet 11   montelukast (SINGULAIR) 10 MG tablet Take 10 mg by mouth as needed.     mupirocin ointment (BACTROBAN) 2 % Apply topically 3 (three) times daily for 7 days 22 g 0   omeprazole (PRILOSEC) 40 MG capsule TAKE 1 CAPSULE (40 MG TOTAL) BY MOUTH IN THE MORNING AND AT BEDTIME. 180 capsule 1   traZODone (DESYREL) 50 MG tablet Take 0.5 tablets (25 mg total) by mouth at bedtime as needed for sleep. 45 tablet 1   triamcinolone (NASACORT) 55 MCG/ACT AERO nasal inhaler Place into the nose.     [START ON 04/11/2021] venlafaxine XR (EFFEXOR-XR) 150 MG 24 hr capsule Take 1 capsule (150 mg total) by mouth daily with breakfast. 90 capsule 0   No current facility-administered medications for this visit.     Musculoskeletal: Strength & Muscle Tone:  N/A Gait & Station:  N/A Patient leans: N/A  Psychiatric Specialty Exam: Review of Systems  Psychiatric/Behavioral:  Positive for decreased concentration and sleep disturbance. Negative for agitation, behavioral problems, confusion, dysphoric mood, hallucinations, self-injury and suicidal ideas. The patient is nervous/anxious. The patient is not hyperactive.   All other systems reviewed and are negative.  currently breastfeeding.There is no height or weight on file to calculate BMI.  General Appearance: Fairly Groomed  Eye Contact:  Good  Speech:  Clear and Coherent  Volume:  Normal  Mood:   good  Affect:  Appropriate, Congruent, and euthymic  Thought Process:  Coherent  Orientation:  Full (Time, Place, and Person)  Thought Content: Logical   Suicidal Thoughts:  No  Homicidal Thoughts:  No  Memory:  Immediate;   Good  Judgement:   Good  Insight:  Good  Psychomotor Activity:  Normal  Concentration:  Concentration: Good and Attention Span: Good  Recall:  Good  Fund of Knowledge: Good  Language: Good  Akathisia:  No  Handed:  Right  AIMS (if indicated): not done  Assets:  Communication Skills Desire for Improvement  ADL's:  Intact  Cognition: WNL  Sleep:  Fair   Screenings: GAD-7    Flowsheet Row Office Visit from 06/16/2020 in [facility] Office Visit from 04/28/2020 in [facility]  Total GAD-7 Score 7 8      PHQ2-9    Flowsheet Row Video Visit from 03/30/2021 in [facility] Video Visit from 01/26/2021 in [facility] Video Visit from 11/10/2020 in [facility] Video Visit from 10/06/2020 in [facility] Video Visit from 09/08/2020 in [facility]  PHQ-2 Total Score 0 0 0 1 0      Flowsheet Row Video Visit from 03/30/2021 in [facility] Video Visit from 11/10/2020 in [facility] Video Visit from 10/06/2020 in [facility]  C-SSRS RISK CATEGORY No Risk No Risk No Risk        Assessment and Plan:  Paula [name] is a 40 y.o. year old female with a history of depression, anxiety, GERD, hypothyroidism, PCOS, who presents for follow up appointment for below.   1. MDD (major depressive disorder), recurrent, in partial remission (HCC) 2. PTSD (post-traumatic stress disorder) She denies significant mood symptoms since the last visit.  Psychosocial stressors includes being a caregiver of her children, recent conflict with her friend, and childhood trauma.  Will continue current dose of venlafaxine to target depression, anxiety and PTSD.   # insomnia There has been overall improvement in and insomnia.  We will continue current dose of trazodone to target insomnia.     Plan  1. Continue venlafaxine 150 mg daily 2. Continue Trazodone 25 mg at night as needed for sleep 3. Next appointment: 2/1 at 10 AM , video     The patient demonstrates the following risk factors for suicide: Chronic risk factors for suicide include: psychiatric disorder of depression, anxiety. Acute risk factors for suicide include: N/A. Protective factors for this patient include: positive social support, coping skills and hope for the future. Considering these factors, the overall suicide risk at this point appears to be low. Patient is appropriate for outpatient follow up.  Neysa Hotter, MD 03/30/2021, 10:37 AM

## 2021-03-30 ENCOUNTER — Telehealth (INDEPENDENT_AMBULATORY_CARE_PROVIDER_SITE_OTHER): Payer: No Typology Code available for payment source | Admitting: Psychiatry

## 2021-03-30 ENCOUNTER — Encounter: Payer: Self-pay | Admitting: Psychiatry

## 2021-03-30 ENCOUNTER — Other Ambulatory Visit: Payer: Self-pay

## 2021-03-30 DIAGNOSIS — F411 Generalized anxiety disorder: Secondary | ICD-10-CM

## 2021-03-30 DIAGNOSIS — G4700 Insomnia, unspecified: Secondary | ICD-10-CM | POA: Diagnosis not present

## 2021-03-30 DIAGNOSIS — F3341 Major depressive disorder, recurrent, in partial remission: Secondary | ICD-10-CM

## 2021-03-30 DIAGNOSIS — F4310 Post-traumatic stress disorder, unspecified: Secondary | ICD-10-CM

## 2021-03-30 MED ORDER — VENLAFAXINE HCL ER 150 MG PO CP24
150.0000 mg | ORAL_CAPSULE | Freq: Every day | ORAL | 0 refills | Status: DC
  Filled 2021-03-30: qty 90, 90d supply, fill #0

## 2021-03-30 MED FILL — Levothyroxine Sodium Tab 75 MCG: ORAL | 30 days supply | Qty: 30 | Fill #6 | Status: AC

## 2021-03-30 NOTE — Patient Instructions (Signed)
1. Continue venlafaxine 150 mg daily 2. Continue Trazodone 25 mg at night as needed for sleep 3. Next appointment: 2/1 at 10 AM

## 2021-05-10 ENCOUNTER — Other Ambulatory Visit: Payer: Self-pay

## 2021-05-10 ENCOUNTER — Other Ambulatory Visit: Payer: Self-pay | Admitting: Certified Nurse Midwife

## 2021-05-10 MED ORDER — LEVOTHYROXINE SODIUM 75 MCG PO TABS
ORAL_TABLET | Freq: Every day | ORAL | 11 refills | Status: DC
  Filled 2021-05-10: qty 30, 30d supply, fill #0
  Filled 2021-06-10 – 2021-06-13 (×2): qty 30, 30d supply, fill #1
  Filled 2021-07-15: qty 30, 30d supply, fill #2
  Filled 2021-08-30: qty 30, 30d supply, fill #3
  Filled 2021-10-04: qty 30, 30d supply, fill #4
  Filled 2021-10-31: qty 30, 30d supply, fill #5
  Filled 2021-12-06: qty 30, 30d supply, fill #6
  Filled 2022-01-13: qty 30, 30d supply, fill #7
  Filled 2022-01-31: qty 30, 30d supply, fill #8
  Filled 2022-02-24 – 2022-03-17 (×2): qty 30, 30d supply, fill #9
  Filled 2022-04-13: qty 30, 30d supply, fill #10

## 2021-06-09 ENCOUNTER — Other Ambulatory Visit: Payer: Self-pay | Admitting: Gastroenterology

## 2021-06-09 DIAGNOSIS — Z91018 Allergy to other foods: Secondary | ICD-10-CM

## 2021-06-10 ENCOUNTER — Other Ambulatory Visit (HOSPITAL_COMMUNITY): Payer: Self-pay

## 2021-06-10 MED FILL — Omeprazole Cap Delayed Release 40 MG: ORAL | 90 days supply | Qty: 180 | Fill #1 | Status: CN

## 2021-06-13 ENCOUNTER — Other Ambulatory Visit: Payer: Self-pay

## 2021-06-13 ENCOUNTER — Other Ambulatory Visit (HOSPITAL_COMMUNITY): Payer: Self-pay

## 2021-06-13 MED FILL — Omeprazole Cap Delayed Release 40 MG: ORAL | 90 days supply | Qty: 180 | Fill #1 | Status: AC

## 2021-06-14 ENCOUNTER — Other Ambulatory Visit: Payer: Self-pay

## 2021-06-14 MED ORDER — METHOCARBAMOL 500 MG PO TABS
ORAL_TABLET | ORAL | 0 refills | Status: DC
  Filled 2021-06-14: qty 30, 10d supply, fill #0

## 2021-06-14 MED ORDER — PREDNISONE 10 MG PO TABS
ORAL_TABLET | ORAL | 0 refills | Status: DC
  Filled 2021-06-14: qty 21, 6d supply, fill #0

## 2021-06-14 MED ORDER — MELOXICAM 15 MG PO TABS
ORAL_TABLET | ORAL | 0 refills | Status: DC
  Filled 2021-06-14: qty 30, 30d supply, fill #0

## 2021-06-17 ENCOUNTER — Encounter: Payer: Self-pay | Admitting: Certified Nurse Midwife

## 2021-06-20 ENCOUNTER — Other Ambulatory Visit: Payer: Self-pay | Admitting: Certified Nurse Midwife

## 2021-06-20 ENCOUNTER — Other Ambulatory Visit: Payer: Self-pay

## 2021-06-20 MED ORDER — CAYA VA DPRH
1.0000 | VAGINAL_INSERT | Freq: Every day | VAGINAL | 0 refills | Status: DC | PRN
  Filled 2021-06-20: qty 1, 1d supply, fill #0

## 2021-06-21 ENCOUNTER — Other Ambulatory Visit: Payer: Self-pay

## 2021-06-22 ENCOUNTER — Other Ambulatory Visit (HOSPITAL_COMMUNITY): Payer: Self-pay
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
## 2021-06-29 ENCOUNTER — Other Ambulatory Visit: Payer: Self-pay

## 2021-06-29 ENCOUNTER — Encounter: Payer: Self-pay | Admitting: Psychiatry

## 2021-06-29 ENCOUNTER — Telehealth (INDEPENDENT_AMBULATORY_CARE_PROVIDER_SITE_OTHER): Payer: No Typology Code available for payment source | Admitting: Psychiatry

## 2021-06-29 DIAGNOSIS — G4700 Insomnia, unspecified: Secondary | ICD-10-CM | POA: Diagnosis not present

## 2021-06-29 DIAGNOSIS — F330 Major depressive disorder, recurrent, mild: Secondary | ICD-10-CM | POA: Diagnosis not present

## 2021-06-29 DIAGNOSIS — F4310 Post-traumatic stress disorder, unspecified: Secondary | ICD-10-CM

## 2021-06-29 MED ORDER — VENLAFAXINE HCL ER 75 MG PO CP24
225.0000 mg | ORAL_CAPSULE | Freq: Every day | ORAL | 0 refills | Status: DC
  Filled 2021-06-29: qty 90, 30d supply, fill #0

## 2021-06-29 MED ORDER — ZOLPIDEM TARTRATE 5 MG PO TABS
5.0000 mg | ORAL_TABLET | Freq: Every evening | ORAL | 0 refills | Status: DC | PRN
Start: 2021-06-29 — End: 2021-07-27
  Filled 2021-06-29: qty 30, 30d supply, fill #0

## 2021-06-29 NOTE — Patient Instructions (Signed)
Increase venlafaxine 225 mg daily Discontinue Trazodone  Start Ambien 5 mg at night as needed for insomnia Next appointment: 3/1 at 11:30

## 2021-07-15 ENCOUNTER — Other Ambulatory Visit: Payer: Self-pay
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
## 2021-07-27 ENCOUNTER — Encounter: Payer: Self-pay | Admitting: Certified Nurse Midwife

## 2021-07-27 ENCOUNTER — Other Ambulatory Visit: Payer: Self-pay

## 2021-07-27 ENCOUNTER — Other Ambulatory Visit: Payer: Self-pay | Admitting: Obstetrics

## 2021-07-27 ENCOUNTER — Encounter: Payer: Self-pay | Admitting: Psychiatry

## 2021-07-27 ENCOUNTER — Telehealth (INDEPENDENT_AMBULATORY_CARE_PROVIDER_SITE_OTHER): Payer: No Typology Code available for payment source | Admitting: Psychiatry

## 2021-07-27 DIAGNOSIS — F4310 Post-traumatic stress disorder, unspecified: Secondary | ICD-10-CM | POA: Diagnosis not present

## 2021-07-27 DIAGNOSIS — F3341 Major depressive disorder, recurrent, in partial remission: Secondary | ICD-10-CM

## 2021-07-27 DIAGNOSIS — G4700 Insomnia, unspecified: Secondary | ICD-10-CM | POA: Diagnosis not present

## 2021-07-27 MED ORDER — ZOLPIDEM TARTRATE 5 MG PO TABS
5.0000 mg | ORAL_TABLET | Freq: Every evening | ORAL | 2 refills | Status: DC | PRN
Start: 2021-07-30 — End: 2021-10-20
  Filled 2021-07-27: qty 30, 30d supply, fill #0

## 2021-07-27 MED ORDER — VENLAFAXINE HCL ER 75 MG PO CP24
225.0000 mg | ORAL_CAPSULE | Freq: Every day | ORAL | 2 refills | Status: DC
  Filled 2021-07-27: qty 90, 30d supply, fill #0
  Filled 2021-09-05: qty 90, 30d supply, fill #1
  Filled 2021-10-04: qty 90, 30d supply, fill #2

## 2021-07-27 NOTE — Patient Instructions (Signed)
Continue venlafaxine 225 mg daily ?Continue Ambien 5 mg at night as needed for insomnia ?Next appointment: 5/25 at 3:30 ? ?The next visit will be in person visit. Please arrive 15 mins before the scheduled time.  ? ?[facility]  ?Address: [address], [place], [place] [address]   ?

## 2021-08-10 ENCOUNTER — Other Ambulatory Visit: Payer: Self-pay

## 2021-08-10 ENCOUNTER — Other Ambulatory Visit (HOSPITAL_COMMUNITY)
Admission: RE | Admit: 2021-08-10 | Discharge: 2021-08-10 | Disposition: A | Discharge status: Home / Self Care | Payer: No Typology Code available for payment source | Source: Ambulatory Visit | Attending: Certified Nurse Midwife | Admitting: Certified Nurse Midwife

## 2021-08-10 ENCOUNTER — Encounter: Payer: Self-pay | Admitting: Certified Nurse Midwife

## 2021-08-10 ENCOUNTER — Ambulatory Visit (INDEPENDENT_AMBULATORY_CARE_PROVIDER_SITE_OTHER): Payer: No Typology Code available for payment source | Admitting: Certified Nurse Midwife

## 2021-08-10 ENCOUNTER — Telehealth: Payer: Self-pay | Admitting: Certified Nurse Midwife

## 2021-08-10 VITALS — BP 137/99 | HR 69 | Ht 59.0 in | Wt 164.6 lb

## 2021-08-10 DIAGNOSIS — Z124 Encounter for screening for malignant neoplasm of cervix: Secondary | ICD-10-CM

## 2021-08-10 DIAGNOSIS — Z789 Other specified health status: Secondary | ICD-10-CM

## 2021-08-10 DIAGNOSIS — E669 Obesity, unspecified: Secondary | ICD-10-CM | POA: Diagnosis not present

## 2021-08-10 DIAGNOSIS — Z01419 Encounter for gynecological examination (general) (routine) without abnormal findings: Secondary | ICD-10-CM

## 2021-08-10 MED ORDER — SLYND 4 MG PO TABS
1.0000 | ORAL_TABLET | Freq: Every day | ORAL | 11 refills | Status: DC
  Filled 2021-08-10: qty 28, 28d supply, fill #0
  Filled 2021-08-17: qty 84, 84d supply, fill #0
  Filled 2021-11-07: qty 84, 84d supply, fill #1
  Filled 2022-01-25: qty 84, 84d supply, fill #2
  Filled 2022-01-31 – 2022-05-12 (×2): qty 84, 84d supply, fill #3

## 2021-08-10 NOTE — Progress Notes (Signed)
? ? ?GYNECOLOGY ANNUAL PREVENTATIVE CARE ENCOUNTER NOTE ? ?History:    ? Paula [name] is a 41 y.o. [id number] female here for a routine annual gynecologic exam.  Current complaints: can not lose weight.  Irregular periods-no longer taking nuvaring. Pt has history PCOS. Denies abnormal vaginal bleeding, discharge, pelvic pain, problems with intercourse or other gynecologic concerns.  ?  ? 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 ?Exercise: lots of walking with work ?Smoke/Alcohol/drug use: denies use  ? ?Gynecologic History ?No LMP recorded (lmp unknown). ?Contraception: none ?Last Pap: 03/25/2017. Results were: normal with negative HPV ?Last mammogram: n/a .  ? ?Obstetric History ?OB History  ?Gravida Para Term Preterm AB Living  ?2 2 2     2   ?SAB IAB Ectopic Multiple Live Births  ?      0 2  ?  ?# Outcome Date GA Lbr Len/2nd Weight Sex Delivery Anes PTL Lv  ?2 Term 10/27/15 5425w1d  7 lb 11.1 oz (3.49 kg) F CS-LTranv Spinal  LIV  ?1 Term 04/19/13 3053w5d  7 lb 4 oz (3.289 kg) F CS-LTranv Spinal  LIV  ?  ?Obstetric Comments  ?LTCS: [place], CPD.   ? ? ?Past Medical History:  ?Diagnosis Date  ? Anemia   ? Asthma   ? exercise induced  ? BULIMIA 01/17/2008  ? Annotation: AGE 41 Qualifier: Diagnosis of  By: Lorrene Reidombs LPN, Burna MortimerWanda    ? Complication of anesthesia   ? vomited during last c/s  ? CPD (cephalo-pelvic disproportion) 12/22/2014  ? Cystic fibrosis carrier in second trimester, antepartum 05/29/2014  ? Depression   ? Eczema   ? GERD (gastroesophageal reflux disease)   ? chronic  ? Headache   ? h/o migraines  ? Hypothyroidism   ? Nausea   ? Oligomenorrhea 12/22/2014  ? PCOS (polycystic ovarian syndrome)   ? PONV (postoperative nausea and vomiting)   ? Rhinitis, allergic   ? ? ?Past Surgical History:  ?Procedure Laterality Date  ? CESAREAN SECTION  04/19/2013  ? LTCS; CPD  ? CESAREAN SECTION N/A 10/27/2015  ? Procedure: REPEAT CESAREAN SECTION;  Surgeon: Herold HarmsMartin A  Defrancesco, MD;  Location: [facility];  Service: Obstetrics;  Laterality: N/A;  ? ENDOSCOPIC TURBINATE REDUCTION Bilateral 07/26/2017  ? Procedure: ENDOSCOPIC INFERIOR  TURBINATE REDUCTION;  Surgeon: Vernie MurdersJuengel, Paul, MD;  [facility];  Service: ENT;  Laterality: Bilateral;  ? FRONTAL SINUS EXPLORATION Bilateral 07/26/2017  ? Procedure: FRONTAL SINUS EXPLORATION;  Surgeon: Vernie MurdersJuengel, Paul, MD;  [facility];  Service: ENT;  Laterality: Bilateral;  ? IMAGE GUIDED SINUS SURGERY Bilateral 07/26/2017  ? Procedure: IMAGE GUIDED SINUS SURGERY;  Surgeon: Vernie MurdersJuengel, Paul, MD;  [facility];  Service: ENT;  Laterality: Bilateral;  GAVE DISK TO CECE 2-12  ? SEPTOPLASTY WITH ETHMOIDECTOMY, AND MAXILLARY ANTROSTOMY Bilateral 07/26/2017  ? Procedure: SEPTOPLASTY WITH TOTAL  ETHMOIDECTOMY, AND MAXILLARY ANTROSTOMYWITH REMOVAL OF TISSUE,;  Surgeon: Vernie MurdersJuengel, Paul, MD;  [facility];  Service: ENT;  Laterality: Bilateral;  ? TONSILLECTOMY    ? WISDOM TOOTH EXTRACTION    ? ? ?Current Outpatient Medications on File Prior to Visit  ?Medication Sig Dispense Refill  ? albuterol (PROVENTIL HFA;VENTOLIN HFA) 108 (90 Base) MCG/ACT inhaler Inhale 2 puffs into the lungs every 6 (six) hours as needed for wheezing or shortness of breath.    ? cetirizine (ZYRTEC) 10 MG tablet Take 10 mg by mouth daily.    ? famotidine (PEPCID) 40  MG tablet Take 40 mg by mouth as needed for heartburn or indigestion.    ? levothyroxine (SYNTHROID) 75 MCG tablet TAKE 1 TABLET BY MOUTH DAILY BEFORE BREAKFAST 30 tablet 11  ? meloxicam (MOBIC) 15 MG tablet Take 1 tablet every day by oral route. 30 tablet 0  ? methocarbamol (ROBAXIN) 500 MG tablet Take 1 tablet by mouth every 8 hours 30 tablet 0  ? montelukast (SINGULAIR) 10 MG tablet Take 10 mg by mouth as needed.    ? omeprazole (PRILOSEC) 40 MG capsule TAKE 1 CAPSULE (40 MG TOTAL) BY MOUTH IN THE MORNING AND AT BEDTIME. 180 capsule 1  ? predniSONE (DELTASONE)  10 MG tablet Take all 6 tablets by mouth on day 1, then decrease by one tablet each day 21 tablet 0  ? triamcinolone (NASACORT) 55 MCG/ACT AERO nasal inhaler Place into the nose.    ? venlafaxine XR (EFFEXOR-XR) 75 MG 24 hr capsule Take 3 capsules (225 mg total) by mouth daily with breakfast. 90 capsule 2  ? zolpidem (AMBIEN) 5 MG tablet Take 1 tablet (5 mg total) by mouth at bedtime as needed for sleep. 30 tablet 2  ? ciprofloxacin (CIPRO) 250 MG tablet Take 1 tablet (250 mg total) by mouth 2 (two) times daily for 5 days 10 tablet 0  ? etonogestrel-ethinyl estradiol (NUVARING) 0.12-0.015 MG/24HR vaginal ring INSERT 1 RING VAGINALLY AND LEAVE IN PLACE FOR 3 CONSECUTIVE WEEKS, THEN REMOVE FOR 1 WEEK. 3 each 4  ? mupirocin ointment (BACTROBAN) 2 % Apply topically 3 (three) times daily for 7 days 22 g 0  ? ?No current facility-administered medications on file prior to visit.  ? ? ?Allergies  ?Allergen Reactions  ? Iodine Anaphylaxis  ?  Contrast ?  ? Shellfish Allergy Hives  ? Contrast Media [Iodinated Contrast Media]   ? Flagyl [Metronidazole]   ? ? ?Social History:  reports that she has never smoked. She has never used smokeless tobacco. She reports current alcohol use of about 2.0 standard drinks per week. She reports that she does not use drugs. ? ?Family History  ?Problem Relation Age of Onset  ? Endometriosis Mother   ? Bipolar disorder Mother   ? Alcohol abuse Mother   ? Heart disease Maternal Grandmother   ?     valvular problem  ? Depression Maternal Grandmother   ? Breast cancer Neg Hx   ? Ovarian cancer Neg Hx   ? Colon cancer Neg Hx   ? ? ?The following portions of the patient's history were reviewed and updated as appropriate: allergies, current medications, past family history, past medical history, past social history, past surgical history and problem list. ? ?Review of Systems ?Pertinent items noted in HPI and remainder of comprehensive ROS otherwise negative. ? ?Physical Exam:  ?BP (!) 137/99   Pulse  69   Ht 4\' 11"  (1.499 m)   Wt 164 lb 9.6 oz (74.7 kg)   LMP  (LMP Unknown)   Breastfeeding No   BMI 33.25 kg/m?  ?CONSTITUTIONAL: Well-developed, well-nourished female in no acute distress.  ?HENT:  Normocephalic, atraumatic, External right and left ear normal. Oropharynx is clear and moist ?EYES: Conjunctivae and EOM are normal. Pupils are equal, round, and reactive to light. No scleral icterus.  ?NECK: Normal range of motion, supple, no masses.  Normal thyroid.  ?SKIN: Skin is warm and dry. No rash noted. Not diaphoretic. No erythema. No pallor. ?MUSCULOSKELETAL: Normal range of motion. No tenderness.  No cyanosis, clubbing, or edema.  2+ distal pulses. ?NEUROLOGIC: Alert and oriented to person, place, and time. Normal reflexes, muscle tone coordination.  ?PSYCHIATRIC: Normal mood and affect. Normal behavior. Normal judgment and thought content. ?CARDIOVASCULAR: Normal heart rate noted, regular rhythm ?RESPIRATORY: Clear to auscultation bilaterally. Effort and breath sounds normal, no problems with respiration noted. ?BREASTS: Symmetric in size. No masses, tenderness, skin changes, nipple drainage, or lymphadenopathy bilaterally.  ?ABDOMEN: Soft, no distention noted.  No tenderness, rebound or guarding.  ?PELVIC: Normal appearing external genitalia and urethral meatus; normal appearing vaginal mucosa and cervix.  No abnormal discharge noted.  Pap smear obtained.  Normal uterine size, no other palpable masses, no uterine or adnexal tenderness.  . ?  ?Assessment and Plan:  ?  1. Well woman exam with routine gynecological exam ? ?Pap:Will follow up results of pap smear and manage accordingly. ?Mammogram : n/a  ?Labs:TSH, Lipid, CMP, Hema1c, FSH/LH/estradiol /cortisol  ?Refills: slynd  ?Referral: none ?Pt interested in weight loss medication. Advised to return for initial wt loss visit.  ?Routine preventative health maintenance measures emphasized. ?Please refer to After Visit Summary for other counseling  recommendations.  ?   ? ?Doreene Burke, CNM ?[facility] ?[facility],  [facility]   ?

## 2021-08-10 NOTE — Telephone Encounter (Signed)
Pt called stating that [employer] that was rx today - pharmacy told her that provider needs to override stating that she has already tried other birth control with no success. Please advise.  ?

## 2021-08-11 ENCOUNTER — Encounter: Payer: Self-pay | Admitting: Certified Nurse Midwife

## 2021-08-11 LAB — LIPID PANEL
Chol/HDL Ratio: 4.6 ratio — ABNORMAL HIGH (ref 0.0–4.4)
Cholesterol, Total: 293 mg/dL — ABNORMAL HIGH (ref 100–199)
HDL: 64 mg/dL (ref >39)
LDL Chol Calc (NIH): 214 mg/dL — ABNORMAL HIGH (ref 0–99)
Triglycerides: 92 mg/dL (ref 0–149)
VLDL Cholesterol Cal: 15 mg/dL (ref 5–40)

## 2021-08-11 LAB — THYROID PANEL WITH TSH
Free Thyroxine Index: 1.9 (ref 1.2–4.9)
T3 Uptake Ratio: 30 % (ref 24–39)
T4, Total: 6.3 ug/dL (ref 4.5–12.0)
TSH: 2.63 u[IU]/mL (ref 0.450–4.500)

## 2021-08-11 LAB — HEMOGLOBIN A1C
Est. average glucose Bld gHb Est-mCnc: 103 mg/dL
Hgb A1c MFr Bld: 5.2 % (ref 4.8–5.6)

## 2021-08-11 LAB — ESTRADIOL: Estradiol: 52.6 pg/mL

## 2021-08-11 LAB — COMPREHENSIVE METABOLIC PANEL
ALT: 52 IU/L — ABNORMAL HIGH (ref 0–32)
AST: 37 IU/L (ref 0–40)
Albumin/Globulin Ratio: 1.9 (ref 1.2–2.2)
Albumin: 5 g/dL — ABNORMAL HIGH (ref 3.8–4.8)
Alkaline Phosphatase: 85 IU/L (ref 44–121)
BUN/Creatinine Ratio: 12 (ref 9–23)
BUN: 10 mg/dL (ref 6–20)
Bilirubin Total: 0.8 mg/dL (ref 0.0–1.2)
CO2: 26 mmol/L (ref 20–29)
Calcium: 9.9 mg/dL (ref 8.7–10.2)
Chloride: 100 mmol/L (ref 96–106)
Creatinine, Ser: 0.83 mg/dL (ref 0.57–1.00)
Globulin, Total: 2.6 g/dL (ref 1.5–4.5)
Glucose: 86 mg/dL (ref 70–99)
Potassium: 4.3 mmol/L (ref 3.5–5.2)
Sodium: 139 mmol/L (ref 134–144)
Total Protein: 7.6 g/dL (ref 6.0–8.5)
eGFR: 92 mL/min/{1.73_m2} (ref >59)

## 2021-08-11 LAB — CORTISOL: Cortisol: 9.4 ug/dL

## 2021-08-11 LAB — FSH/LH
FSH: 7.8 m[IU]/mL
LH: 10.4 m[IU]/mL

## 2021-08-12 ENCOUNTER — Encounter: Payer: Self-pay | Admitting: Certified Nurse Midwife

## 2021-08-12 LAB — CYTOLOGY - PAP
Comment: NEGATIVE
Diagnosis: UNDETERMINED — AB
High risk HPV: NEGATIVE

## 2021-08-17 ENCOUNTER — Ambulatory Visit (INDEPENDENT_AMBULATORY_CARE_PROVIDER_SITE_OTHER): Payer: No Typology Code available for payment source | Admitting: Certified Nurse Midwife

## 2021-08-17 ENCOUNTER — Encounter: Payer: Self-pay | Admitting: Certified Nurse Midwife

## 2021-08-17 ENCOUNTER — Other Ambulatory Visit: Payer: Self-pay

## 2021-08-17 VITALS — BP 128/86 | HR 80 | Ht 59.0 in | Wt 164.5 lb

## 2021-08-17 DIAGNOSIS — Z7689 Persons encountering health services in other specified circumstances: Secondary | ICD-10-CM | POA: Diagnosis not present

## 2021-08-17 MED ORDER — CYANOCOBALAMIN 1000 MCG/ML IJ SOLN
1000.0000 ug | Freq: Once | INTRAMUSCULAR | 5 refills | Status: DC
  Filled 2021-08-17: qty 1, 1d supply, fill #0
  Filled 2021-11-23: qty 1, 1d supply, fill #1
  Filled 2022-01-25: qty 1, 30d supply, fill #2

## 2021-08-17 MED ORDER — PHENTERMINE HCL 37.5 MG PO TABS
37.5000 mg | ORAL_TABLET | Freq: Every day | ORAL | 0 refills | Status: DC
  Filled 2021-08-17: qty 30, 30d supply, fill #0

## 2021-08-17 MED ORDER — CYANOCOBALAMIN 1000 MCG/ML IJ SOLN
1000.0000 ug | Freq: Once | INTRAMUSCULAR | Status: AC
  Administered 2021-08-17: 1000 ug via INTRAMUSCULAR

## 2021-08-17 NOTE — Patient Instructions (Signed)

## 2021-08-17 NOTE — Progress Notes (Signed)
Subjective:  ?Paula [name] is a 41 y.o. G2P2002 at Unknown being seen today for weight loss management- initial visit.  Patient reports General ROS: negative and reports previous weight loss attempts: ?  ?Onset was gradual, weight gain started with her pregnancy and she has continued to gain, has not been able to get if off.  ?Onset followed:  pregnancy ?Associated symptoms include: fatigue, depression, anxiety,  change in clothing fit  ?Current treatment includes: small frequent feedings,  vitamin B-12 injections and appetite stimulant. ? Pertinent medical history includes: none ? Risk factors include: none. ? The patient has a surgical history of: none  ?Pertinent social history includes: none ?Past evaluation has included: comprehensive metabolic profile, hemoglobin A1c, thyroid panel  and lipid. ? ?The following portions of the patient's history were reviewed and updated as appropriate: allergies, current medications, past family history, past medical history, past social history, past surgical history and problem list.  ? ?Objective:  ? ?Vitals:  ? 08/17/21 0844  ?BP: 128/86  ?Pulse: 80  ?Weight: 164 lb 8 oz (74.6 kg)  ?Height: 4\' 11"  (1.499 m)  ? ? ?General:  Alert, oriented and cooperative. Patient is in no acute distress.  ?PE: Well groomed female in no current distress,   ?Mental Status: Normal mood and affect. Normal behavior. Normal judgment and thought content.  ? ?Current BMI: Body mass index is 33.22 kg/m?. ? ? ?Assessment and Plan:  ?Obesity ? ?1. Encounter for weight management ?-adipex 37.5 mg  ?- cyanocobalamin ((VITAMIN B-12)) injection 1,000 mcg ? ?Controled substance agreement completed. ? ?Paula, [name] [phone] ?Refine Search ?Contact the [facility] ?Date of Birth: ?Jul 06, 1980 ?Recent Address: ?[address] [place], [place] [address] ?View Linked Records (1) ?Other Tools/Metrics ?NarxCare ?Report generated on 08/17/2021. Report Date Range: 08/18/2019 -  08/17/2021 ?PDF Report ?Export ?Narx Scores ?Narcotic ?060 ?Sedative ?121 ?Stimulant ?000 ?Explanation and Guidance ?Overdose Risk Score ?030 ?(Range [phone]) ?Explanation and Guidance ?State Indicators (0) ? ? ? ? ?Plan: low carb, High protein diet ?RX for adipex 37.5 mg daily and B12 1042mcg.ml monthly, to start now with first injection given at today's visit. Reviewed side-effects common to both medications and expected outcomes. ?Increase daily water intake to at least 8 bottle a day, every day. ? ?Goal is to reduse weight by 5%(8.2 lbs) by end of three months, and will re-evaluate then. ? ?RTC in 4 weeks for  visit to check weight & BP, and get next B12 injections. ? ?Please refer to After Visit Summary for other counseling recommendations.  ? ? ?Philip Aspen, CNM ? ? ? ?Consider the Low Glycemic Index Diet and 6 smaller meals daily .  This boosts your metabolism and regulates your sugars: ? ? ?Use the protein bar by Atkins because they have lots of fiber in them ? ?Find the low carb flatbreads, tortillas and pita breads for sandwiches: ? ?Joseph's makes a pita bread and a flat bread , available at [facility] and [employer]; ?Toufayah makes a low carb flatbread available at [employer] and [employer] that is 9 net carbs and 100 cal ?Mission makes a low carb whole wheat tortilla available at [employer] stores with 6 net carbs and 210 cal ? ?Greek yogurt can still have a lot of carbs .  Dannon Light N fit has 80 cal and 8 carbs  ?  ?

## 2021-08-30 ENCOUNTER — Other Ambulatory Visit: Payer: Self-pay

## 2021-08-31 ENCOUNTER — Encounter: Payer: Self-pay | Admitting: Cardiovascular Disease

## 2021-08-31 ENCOUNTER — Other Ambulatory Visit: Payer: Self-pay

## 2021-08-31 MED ORDER — SPIRONOLACTONE 100 MG PO TABS
ORAL_TABLET | ORAL | 3 refills | Status: AC
  Filled 2021-08-31: qty 60, 30d supply, fill #0
  Filled 2021-10-04: qty 60, 30d supply, fill #1
  Filled 2021-10-31: qty 60, 30d supply, fill #2
  Filled 2021-11-28: qty 60, 30d supply, fill #3
  Filled 2021-12-27: qty 60, 30d supply, fill #4
  Filled 2022-01-31 – 2022-02-14 (×2): qty 60, 30d supply, fill #5
  Filled 2022-03-17: qty 60, 30d supply, fill #6
  Filled 2022-04-13: qty 60, 30d supply, fill #7
  Filled 2022-05-12: qty 60, 30d supply, fill #8
  Filled 2022-06-17 – 2022-06-20 (×2): qty 60, 30d supply, fill #9

## 2021-08-31 NOTE — Progress Notes (Signed)
Unable to contact patient to schedule echo ordered last year. Letter sent to call and schedule. Order cancelled

## 2021-09-05 ENCOUNTER — Other Ambulatory Visit: Payer: Self-pay

## 2021-09-14 ENCOUNTER — Ambulatory Visit (INDEPENDENT_AMBULATORY_CARE_PROVIDER_SITE_OTHER): Payer: No Typology Code available for payment source | Admitting: Certified Nurse Midwife

## 2021-09-14 ENCOUNTER — Other Ambulatory Visit: Payer: Self-pay

## 2021-09-14 ENCOUNTER — Telehealth: Payer: No Typology Code available for payment source | Admitting: Nurse Practitioner

## 2021-09-14 ENCOUNTER — Encounter: Payer: Self-pay | Admitting: Certified Nurse Midwife

## 2021-09-14 VITALS — BP 127/76 | HR 71 | Ht 59.0 in | Wt 158.4 lb

## 2021-09-14 DIAGNOSIS — Z7689 Persons encountering health services in other specified circumstances: Secondary | ICD-10-CM | POA: Diagnosis not present

## 2021-09-14 DIAGNOSIS — L309 Dermatitis, unspecified: Secondary | ICD-10-CM | POA: Diagnosis not present

## 2021-09-14 MED ORDER — PHENTERMINE HCL 37.5 MG PO TABS
37.5000 mg | ORAL_TABLET | Freq: Every day | ORAL | 0 refills | Status: DC
Start: 2021-09-14 — End: 2021-10-13
  Filled 2021-09-14: qty 30, 30d supply, fill #0

## 2021-09-14 MED ORDER — CYANOCOBALAMIN 1000 MCG/ML IJ SOLN
1000.0000 ug | Freq: Once | INTRAMUSCULAR | Status: AC
Start: 2021-09-14 — End: 2021-09-14
  Administered 2021-09-14: 1000 ug via INTRAMUSCULAR

## 2021-09-14 MED ORDER — PREDNISONE 20 MG PO TABS
20.0000 mg | ORAL_TABLET | Freq: Every day | ORAL | 0 refills | Status: DC
Start: 2021-09-14 — End: 2021-09-14
  Filled 2021-09-14: qty 5, 5d supply, fill #0

## 2021-09-14 MED ORDER — BETAMETHASONE DIPROPIONATE 0.05 % EX CREA
TOPICAL_CREAM | Freq: Two times a day (BID) | CUTANEOUS | 0 refills | Status: AC
  Filled 2021-09-14: qty 30, 14d supply, fill #0

## 2021-09-14 NOTE — Progress Notes (Signed)
E-Visit for Eczema ? ?We are sorry that you are not feeling well. Here is how we plan to help! ?Based on what you shared with me it looks like you have eczema (atopic dermatitis).  Although the cause of eczema is not completely understood, genetics appear to play a strong role, and people with a family history of eczema are at increased risk of developing the condition. In most people with eczema, there is a genetic abnormality in the outermost layer of the skin, called the epidermis  ? ?Most people with eczema develop their first symptoms as children, before the age of 75. Intense itching of the skin, patches of redness, small bumps, and skin flaking are common. Scratching can further inflame the skin and worsen the itching. The itchiness may be more noticeable at nighttime. ? ?Eczema commonly affects the back of the neck, the elbow creases, and the backs of the knees. Other affected areas may include the face, wrists, and forearms. The skin may become thickened and darkened, or even scarred, from repeated scratching. ?Eliminating factors that aggravate your eczema symptoms can help to control the symptoms. Possible triggers may include: ?? Cold or dry environments ?? Sweating ?? Emotional stress or anxiety ?? Rapid temperature changes ?? Exposure to certain chemicals or cleaning solutions, including soaps and detergents, perfumes and cosmetics, wool or synthetic fibers, dust, sand, and cigarette smoke ?Keeping your skin hydrated ?Emollients -- Emollients are creams and ointments that moisturize the skin and prevent it from drying out. The best emollients for people with eczema are thick creams (such as Eucerin, Cetaphil, and Nutraderm) or ointments (such as petroleum jelly, Aquaphor, and Vaseline), which contain little to no water. Emollients are most effective when applied immediately after bathing. Emollients can be applied twice a day or more often if needed. Lotions contain more water than creams and  ointments and are less effective for moisturizing the skin. ?Bathing -- It is not clear if showers or baths are better for keeping the skin hydrated. Lukewarm baths or showers can hydrate and cool the skin, temporarily relieving itching from eczema. An unscented, mild soap or non-soap cleanser (such as Cetaphil) should be used sparingly. Apply an emollient immediately after bathing or showering to prevent your skin from drying out as a result of water evaporation. Emollient bath additives (products you add to the bath water) have not been found to help relieve symptoms. ?Hot or long baths (more than 10 to 15 minutes) and showers should be avoided since they can dry out the skin. ? ?Based on what you shared with me you may have eczema.  ? ?Prednisone 20 mg tablets. Take one daily by mouth for 5 days ? ?We would also recommend following up with your dermatologist for ongoing management.  ? ?I recommend dilute bleach baths for people with eczema. These baths help to decrease the number of bacteria on the skin that can cause infections or worsen symptoms. To prepare a bleach bath, one-fourth to one-half cup of bleach is placed in a full bathtub (about 40 gallons) of water. Bleach baths are usually taken for 5 to 10 minutes twice per week and should be followed by application of an emollient (listed above). ?I recommend you take Benadryl 25mg  - 50mg  every 4 hours to control the symptoms (including itching) but if they last over 24 hours it is best that you see an office based provider for follow up. ? ?HOME CARE: ?Take lukewarm showers or baths ?Apply creams and ointments to prevent the  skin from drying (Eucerin, Cetaphil, Nutraderm, petroleum jelly, Aquaphor or Vaseline) - these products contain less water than other lotions and are more effective for moisturizing the skin ?Limit exposure to cold or dry environments, sweating, emotional stress and anxiety, rapid temperature changes and exposure to chemicals and cleaning  products, soaps and detergents, perfumes, cosmetics, wool and synthetic fibers, dust, sand and cigarette- factors which can aggravate eczema symptoms.  ?Use a hydrocortisone cream once or twice a day ?Take an antihistamine like Benadryl for widespread rashes that itch.  The adult dosage of Benadryl is 25-50 mg by mouth 4 times daily. ?Caution: This type of medication may cause sleepiness.  Do not drink alcohol, drive, or operate dangerous machinery while taking antihistamines.  Do not take these medications if you have prostate enlargement.  Read the package instructions thoroughly on all medications that you take.  ?GET HELP RIGHT AWAY IF: ?Symptoms that don't go away after treatment. ?Severe itching that persists. ?You develop a fever. ?Your skin begins to drain. ?You have a sore throat. ?You become short of breath. ? ?MAKE SURE YOU  ? ?Understand these instructions. ?Will watch your condition. ?Will get help right away if you are not doing well or get worse. ? ? ? ?Thank you for choosing an e-visit. ? ?Your e-visit answers were reviewed by a board certified advanced clinical practitioner to complete your personal care plan. Depending upon the condition, your plan could have included both over the counter or prescription medications. ? ?Please review your pharmacy choice. Make sure the pharmacy is open so you can pick up prescription now. If there is a problem, you may contact your provider through [employer] and have the prescription routed to another pharmacy.  Your safety is important to [place]. If you have drug allergies check your prescription carefully.  ? ?For the next 24 hours you can use MyChart to ask questions about today's visit, request a non-urgent call back, or ask for a work or school excuse. ?You will get an email in the next two days asking about your experience. I hope that your e-visit has been valuable and will speed your recovery.  ? ?Meds ordered this encounter  ?Medications  ? predniSONE  (DELTASONE) 20 MG tablet  ?  Sig: Take 1 tablet (20 mg total) by mouth daily with breakfast for 5 days.  ?  Dispense:  5 tablet  ?  Refill:  0  ?  ? ?I spent approximately 5 minutes reviewing the patient's history, current symptoms and coordinating their plan of care today.   ?

## 2021-09-14 NOTE — Progress Notes (Signed)
SUBJECTIVE:  41 y.o. here for follow-up weight loss visit, previously seen 4 weeks ago. Denies any concerns and feels like medication is working well, she lost 6 lbs this past month. She has decreased her portion sizes and has increased her activity level. She denies any complications with medication use.  ? ?OBJECTIVE:  BP 127/76   Pulse 71   Ht 4\' 11"  (1.499 m)   Wt 158 lb 6.4 oz (71.8 kg)   LMP  (LMP Unknown)   BMI 31.99 kg/m?   Body mass index is 31.99 kg/m?  Waist 37 in ? ? ?Patient appears well. ?ASSESSMENT:  Obesity- responding well to weight loss plan ?PLAN:  To continue with current medications. B12 1064mcg/ml injection given ?RTC in 4 weeks as planned ? ?80m, CNM  ?

## 2021-09-14 NOTE — Patient Instructions (Signed)
Calorie Counting for Weight Loss ?Calories are units of energy. Your body needs a certain number of calories from food to keep going throughout the day. When you eat or drink more calories than your body needs, your body stores the extra calories mostly as fat. When you eat or drink fewer calories than your body needs, your body burns fat to get the energy it needs. ?Calorie counting means keeping track of how many calories you eat and drink each day. Calorie counting can be helpful if you need to lose weight. If you eat fewer calories than your body needs, you should lose weight. Ask your health care provider what a healthy weight is for you. ?For calorie counting to work, you will need to eat the right number of calories each day to lose a healthy amount of weight per week. A dietitian can help you figure out how many calories you need in a day and will suggest ways to reach your calorie goal. ?A healthy amount of weight to lose each week is usually 1-2 lb (0.5-0.9 kg). This usually means that your daily calorie intake should be reduced by 500-750 calories. ?Eating 1,200-1,500 calories a day can help most women lose weight. ?Eating 1,500-1,800 calories a day can help most men lose weight. ?What do I need to know about calorie counting? ?Work with your health care provider or dietitian to determine how many calories you should get each day. To meet your daily calorie goal, you will need to: ?Find out how many calories are in each food that you would like to eat. Try to do this before you eat. ?Decide how much of the food you plan to eat. ?Keep a food log. Do this by writing down what you ate and how many calories it had. ?To successfully lose weight, it is important to balance calorie counting with a healthy lifestyle that includes regular activity. ?Where do I find calorie information? ? ?The number of calories in a food can be found on a Nutrition Facts label. If a food does not have a Nutrition Facts label, try  to look up the calories online or ask your dietitian for help. ?Remember that calories are listed per serving. If you choose to have more than one serving of a food, you will have to multiply the calories per serving by the number of servings you plan to eat. For example, the label on a package of bread might say that a serving size is 1 slice and that there are 90 calories in a serving. If you eat 1 slice, you will have eaten 90 calories. If you eat 2 slices, you will have eaten 180 calories. ?How do I keep a food log? ?After each time that you eat, record the following in your food log as soon as possible: ?What you ate. Be sure to include toppings, sauces, and other extras on the food. ?How much you ate. This can be measured in cups, ounces, or number of items. ?How many calories were in each food and drink. ?The total number of calories in the food you ate. ?Keep your food log near you, such as in a pocket-sized notebook or on an app or website on your mobile phone. Some programs will calculate calories for you and show you how many calories you have left to meet your daily goal. ?What are some portion-control tips? ?Know how many calories are in a serving. This will help you know how many servings you can have of a certain   food. ?Use a measuring cup to measure serving sizes. You could also try weighing out portions on a kitchen scale. With time, you will be able to estimate serving sizes for some foods. ?Take time to put servings of different foods on your favorite plates or in your favorite bowls and cups so you know what a serving looks like. ?Try not to eat straight from a food's packaging, such as from a bag or box. Eating straight from the package makes it hard to see how much you are eating and can lead to overeating. Put the amount you would like to eat in a cup or on a plate to make sure you are eating the right portion. ?Use smaller plates, glasses, and bowls for smaller portions and to prevent  overeating. ?Try not to multitask. For example, avoid watching TV or using your computer while eating. If it is time to eat, sit down at a table and enjoy your food. This will help you recognize when you are full. It will also help you be more mindful of what and how much you are eating. ?What are tips for following this plan? ?Reading food labels ?Check the calorie count compared with the serving size. The serving size may be smaller than what you are used to eating. ?Check the source of the calories. Try to choose foods that are high in protein, fiber, and vitamins, and low in saturated fat, trans fat, and sodium. ?Shopping ?Read nutrition labels while you shop. This will help you make healthy decisions about which foods to buy. ?Pay attention to nutrition labels for low-fat or fat-free foods. These foods sometimes have the same number of calories or more calories than the full-fat versions. They also often have added sugar, starch, or salt to make up for flavor that was removed with the fat. ?Make a grocery list of lower-calorie foods and stick to it. ?Cooking ?Try to cook your favorite foods in a healthier way. For example, try baking instead of frying. ?Use low-fat dairy products. ?Meal planning ?Use more fruits and vegetables. One-half of your plate should be fruits and vegetables. ?Include lean proteins, such as chicken, turkey, and fish. ?Lifestyle ?Each week, aim to do one of the following: ?150 minutes of moderate exercise, such as walking. ?75 minutes of vigorous exercise, such as running. ?General information ?Know how many calories are in the foods you eat most often. This will help you calculate calorie counts faster. ?Find a way of tracking calories that works for you. Get creative. Try different apps or programs if writing down calories does not work for you. ?What foods should I eat? ? ?Eat nutritious foods. It is better to have a nutritious, high-calorie food, such as an avocado, than a food with  few nutrients, such as a bag of potato chips. ?Use your calories on foods and drinks that will fill you up and will not leave you hungry soon after eating. ?Examples of foods that fill you up are nuts and nut butters, vegetables, lean proteins, and high-fiber foods such as whole grains. High-fiber foods are foods with more than 5 g of fiber per serving. ?Pay attention to calories in drinks. Low-calorie drinks include water and unsweetened drinks. ?The items listed above may not be a complete list of foods and beverages you can eat. Contact a dietitian for more information. ?What foods should I limit? ?Limit foods or drinks that are not good sources of vitamins, minerals, or protein or that are high in unhealthy fats. These   include: ?Candy. ?Other sweets. ?Sodas, specialty coffee drinks, alcohol, and juice. ?The items listed above may not be a complete list of foods and beverages you should avoid. Contact a dietitian for more information. ?How do I count calories when eating out? ?Pay attention to portions. Often, portions are much larger when eating out. Try these tips to keep portions smaller: ?Consider sharing a meal instead of getting your own. ?If you get your own meal, eat only half of it. Before you start eating, ask for a container and put half of your meal into it. ?When available, consider ordering smaller portions from the menu instead of full portions. ?Pay attention to your food and drink choices. Knowing the way food is cooked and what is included with the meal can help you eat fewer calories. ?If calories are listed on the menu, choose the lower-calorie options. ?Choose dishes that include vegetables, fruits, whole grains, low-fat dairy products, and lean proteins. ?Choose items that are boiled, broiled, grilled, or steamed. Avoid items that are buttered, battered, fried, or served with cream sauce. Items labeled as crispy are usually fried, unless stated otherwise. ?Choose water, low-fat milk,  unsweetened iced tea, or other drinks without added sugar. If you want an alcoholic beverage, choose a lower-calorie option, such as a glass of wine or light beer. ?Ask for dressings, sauces, and syrups on the side.

## 2021-09-14 NOTE — Addendum Note (Signed)
Addended by: Apolonio Schneiders E on: 09/14/2021 11:21 AM ? ? Modules accepted: Orders ? ?

## 2021-10-04 ENCOUNTER — Other Ambulatory Visit: Payer: Self-pay

## 2021-10-07 ENCOUNTER — Other Ambulatory Visit: Payer: Self-pay

## 2021-10-10 ENCOUNTER — Other Ambulatory Visit: Payer: Self-pay | Admitting: Gastroenterology

## 2021-10-10 DIAGNOSIS — Z91018 Allergy to other foods: Secondary | ICD-10-CM

## 2021-10-13 ENCOUNTER — Encounter: Payer: Self-pay | Admitting: Certified Nurse Midwife

## 2021-10-13 ENCOUNTER — Other Ambulatory Visit: Payer: Self-pay

## 2021-10-13 ENCOUNTER — Ambulatory Visit (INDEPENDENT_AMBULATORY_CARE_PROVIDER_SITE_OTHER): Payer: No Typology Code available for payment source | Admitting: Certified Nurse Midwife

## 2021-10-13 VITALS — BP 131/75 | HR 82 | Ht 59.0 in | Wt 149.8 lb

## 2021-10-13 DIAGNOSIS — Z7689 Persons encountering health services in other specified circumstances: Secondary | ICD-10-CM

## 2021-10-13 MED ORDER — PHENTERMINE HCL 37.5 MG PO TABS
37.5000 mg | ORAL_TABLET | Freq: Every day | ORAL | 0 refills | Status: DC
  Filled 2021-10-13: qty 30, 30d supply, fill #0

## 2021-10-13 MED ORDER — CYANOCOBALAMIN 1000 MCG/ML IJ SOLN
1000.0000 ug | Freq: Once | INTRAMUSCULAR | Status: AC
  Administered 2021-10-13: 1000 ug via INTRAMUSCULAR

## 2021-10-13 NOTE — Addendum Note (Signed)
Addended by: Drenda Freeze on: 10/13/2021 03:56 PM   Modules accepted: Orders

## 2021-10-13 NOTE — Progress Notes (Signed)
SUBJECTIVE:  41 y.o. here for follow-up weight loss visit, previously seen 4 weeks ago. Denies any concerns and feels like medication is working well. She denies any negative side effects . She lost 6 lbs this past month and is feeling great.  OBJECTIVE:  BP 131/75   Pulse 82   Ht 4\' 11"  (1.499 m)   Wt 149 lb 12.8 oz (67.9 kg)   LMP  (LMP Unknown)   BMI 30.26 kg/m   Body mass index is 30.26 kg/m.  Waist: 36 in  Patient appears well. ASSESSMENT:  Obesity- responding well to weight loss plan PLAN:  To continue with current medications. B12 1087mcg/ml injection given RTC in 4 weeks as planned  80m, CNM

## 2021-10-13 NOTE — Patient Instructions (Signed)
Calorie Counting for Weight Loss Calories are units of energy. Your body needs a certain number of calories from food to keep going throughout the day. When you eat or drink more calories than your body needs, your body stores the extra calories mostly as fat. When you eat or drink fewer calories than your body needs, your body burns fat to get the energy it needs. Calorie counting means keeping track of how many calories you eat and drink each day. Calorie counting can be helpful if you need to lose weight. If you eat fewer calories than your body needs, you should lose weight. Ask your health care provider what a healthy weight is for you. For calorie counting to work, you will need to eat the right number of calories each day to lose a healthy amount of weight per week. A dietitian can help you figure out how many calories you need in a day and will suggest ways to reach your calorie goal. A healthy amount of weight to lose each week is usually 1-2 lb (0.5-0.9 kg). This usually means that your daily calorie intake should be reduced by 500-750 calories. Eating 1,200-1,500 calories a day can help most women lose weight. Eating 1,500-1,800 calories a day can help most men lose weight. What do I need to know about calorie counting? Work with your health care provider or dietitian to determine how many calories you should get each day. To meet your daily calorie goal, you will need to: Find out how many calories are in each food that you would like to eat. Try to do this before you eat. Decide how much of the food you plan to eat. Keep a food log. Do this by writing down what you ate and how many calories it had. To successfully lose weight, it is important to balance calorie counting with a healthy lifestyle that includes regular activity. Where do I find calorie information?  The number of calories in a food can be found on a Nutrition Facts label. If a food does not have a Nutrition Facts label, try  to look up the calories online or ask your dietitian for help. Remember that calories are listed per serving. If you choose to have more than one serving of a food, you will have to multiply the calories per serving by the number of servings you plan to eat. For example, the label on a package of bread might say that a serving size is 1 slice and that there are 90 calories in a serving. If you eat 1 slice, you will have eaten 90 calories. If you eat 2 slices, you will have eaten 180 calories. How do I keep a food log? After each time that you eat, record the following in your food log as soon as possible: What you ate. Be sure to include toppings, sauces, and other extras on the food. How much you ate. This can be measured in cups, ounces, or number of items. How many calories were in each food and drink. The total number of calories in the food you ate. Keep your food log near you, such as in a pocket-sized notebook or on an app or website on your mobile phone. Some programs will calculate calories for you and show you how many calories you have left to meet your daily goal. What are some portion-control tips? Know how many calories are in a serving. This will help you know how many servings you can have of a certain   food. Use a measuring cup to measure serving sizes. You could also try weighing out portions on a kitchen scale. With time, you will be able to estimate serving sizes for some foods. Take time to put servings of different foods on your favorite plates or in your favorite bowls and cups so you know what a serving looks like. Try not to eat straight from a food's packaging, such as from a bag or box. Eating straight from the package makes it hard to see how much you are eating and can lead to overeating. Put the amount you would like to eat in a cup or on a plate to make sure you are eating the right portion. Use smaller plates, glasses, and bowls for smaller portions and to prevent  overeating. Try not to multitask. For example, avoid watching TV or using your computer while eating. If it is time to eat, sit down at a table and enjoy your food. This will help you recognize when you are full. It will also help you be more mindful of what and how much you are eating. What are tips for following this plan? Reading food labels Check the calorie count compared with the serving size. The serving size may be smaller than what you are used to eating. Check the source of the calories. Try to choose foods that are high in protein, fiber, and vitamins, and low in saturated fat, trans fat, and sodium. Shopping Read nutrition labels while you shop. This will help you make healthy decisions about which foods to buy. Pay attention to nutrition labels for low-fat or fat-free foods. These foods sometimes have the same number of calories or more calories than the full-fat versions. They also often have added sugar, starch, or salt to make up for flavor that was removed with the fat. Make a grocery list of lower-calorie foods and stick to it. Cooking Try to cook your favorite foods in a healthier way. For example, try baking instead of frying. Use low-fat dairy products. Meal planning Use more fruits and vegetables. One-half of your plate should be fruits and vegetables. Include lean proteins, such as chicken, turkey, and fish. Lifestyle Each week, aim to do one of the following: 150 minutes of moderate exercise, such as walking. 75 minutes of vigorous exercise, such as running. General information Know how many calories are in the foods you eat most often. This will help you calculate calorie counts faster. Find a way of tracking calories that works for you. Get creative. Try different apps or programs if writing down calories does not work for you. What foods should I eat?  Eat nutritious foods. It is better to have a nutritious, high-calorie food, such as an avocado, than a food with  few nutrients, such as a bag of potato chips. Use your calories on foods and drinks that will fill you up and will not leave you hungry soon after eating. Examples of foods that fill you up are nuts and nut butters, vegetables, lean proteins, and high-fiber foods such as whole grains. High-fiber foods are foods with more than 5 g of fiber per serving. Pay attention to calories in drinks. Low-calorie drinks include water and unsweetened drinks. The items listed above may not be a complete list of foods and beverages you can eat. Contact a dietitian for more information. What foods should I limit? Limit foods or drinks that are not good sources of vitamins, minerals, or protein or that are high in unhealthy fats. These   include: Candy. Other sweets. Sodas, specialty coffee drinks, alcohol, and juice. The items listed above may not be a complete list of foods and beverages you should avoid. Contact a dietitian for more information. How do I count calories when eating out? Pay attention to portions. Often, portions are much larger when eating out. Try these tips to keep portions smaller: Consider sharing a meal instead of getting your own. If you get your own meal, eat only half of it. Before you start eating, ask for a container and put half of your meal into it. When available, consider ordering smaller portions from the menu instead of full portions. Pay attention to your food and drink choices. Knowing the way food is cooked and what is included with the meal can help you eat fewer calories. If calories are listed on the menu, choose the lower-calorie options. Choose dishes that include vegetables, fruits, whole grains, low-fat dairy products, and lean proteins. Choose items that are boiled, broiled, grilled, or steamed. Avoid items that are buttered, battered, fried, or served with cream sauce. Items labeled as crispy are usually fried, unless stated otherwise. Choose water, low-fat milk,  unsweetened iced tea, or other drinks without added sugar. If you want an alcoholic beverage, choose a lower-calorie option, such as a glass of wine or light beer. Ask for dressings, sauces, and syrups on the side. These are usually high in calories, so you should limit the amount you eat. If you want a salad, choose a garden salad and ask for grilled meats. Avoid extra toppings such as bacon, cheese, or fried items. Ask for the dressing on the side, or ask for olive oil and vinegar or lemon to use as dressing. Estimate how many servings of a food you are given. Knowing serving sizes will help you be aware of how much food you are eating at restaurants. Where to find more information Centers for Disease Control and Prevention: www.cdc.gov U.[place]. Department of Agriculture: myplate.gov Summary Calorie counting means keeping track of how many calories you eat and drink each day. If you eat fewer calories than your body needs, you should lose weight. A healthy amount of weight to lose per week is usually 1-2 lb (0.5-0.9 kg). This usually means reducing your daily calorie intake by 500-750 calories. The number of calories in a food can be found on a Nutrition Facts label. If a food does not have a Nutrition Facts label, try to look up the calories online or ask your dietitian for help. Use smaller plates, glasses, and bowls for smaller portions and to prevent overeating. Use your calories on foods and drinks that will fill you up and not leave you hungry shortly after a meal. This information is not intended to replace advice given to you by your health care provider. Make sure you discuss any questions you have with your health care provider. Document Revised: 06/26/2019 Document Reviewed: 06/26/2019 Elsevier Patient Education  2023 Elsevier Inc.  

## 2021-10-15 LAB — FOOD ALLERGY PROFILE
Allergen Corn, IgE: <0.1 kU/L
Clam IgE: <0.1 kU/L
Codfish IgE: <0.1 kU/L
Egg White IgE: <0.1 kU/L
Milk IgE: <0.1 kU/L
Peanut IgE: 0.25 kU/L — AB
Scallop IgE: <0.1 kU/L
Sesame Seed IgE: 0.11 kU/L — AB
Shrimp IgE: <0.1 kU/L
Soybean IgE: <0.1 kU/L
Walnut IgE: <0.1 kU/L
Wheat IgE: <0.1 kU/L

## 2021-10-15 LAB — CELIAC DISEASE AB SCREEN W/RFX
Antigliadin Abs, IgA: 2 units (ref 0–19)
IgA/Immunoglobulin A, Serum: 156 mg/dL (ref 87–352)
Transglutaminase IgA: <2 U/mL (ref 0–3)
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
## 2021-10-20 ENCOUNTER — Ambulatory Visit (INDEPENDENT_AMBULATORY_CARE_PROVIDER_SITE_OTHER): Payer: No Typology Code available for payment source | Admitting: Psychiatry

## 2021-10-20 ENCOUNTER — Other Ambulatory Visit: Payer: Self-pay

## 2021-10-20 ENCOUNTER — Encounter: Payer: Self-pay | Admitting: Psychiatry

## 2021-10-20 VITALS — BP 128/85 | HR 80 | Temp 98.7°F | Wt 149.6 lb

## 2021-10-20 DIAGNOSIS — G4700 Insomnia, unspecified: Secondary | ICD-10-CM

## 2021-10-20 DIAGNOSIS — F4310 Post-traumatic stress disorder, unspecified: Secondary | ICD-10-CM

## 2021-10-20 DIAGNOSIS — F3341 Major depressive disorder, recurrent, in partial remission: Secondary | ICD-10-CM | POA: Diagnosis not present

## 2021-10-20 MED ORDER — ESZOPICLONE 1 MG PO TABS
1.0000 mg | ORAL_TABLET | Freq: Every evening | ORAL | 1 refills | Status: DC | PRN
  Filled 2021-10-20: qty 30, 30d supply, fill #0
  Filled 2021-11-28: qty 30, 30d supply, fill #1

## 2021-10-20 MED ORDER — VENLAFAXINE HCL ER 75 MG PO CP24
225.0000 mg | ORAL_CAPSULE | Freq: Every day | ORAL | 2 refills | Status: DC
Start: 2021-11-05 — End: 2022-02-24
  Filled 2021-10-20: qty 90, 30d supply, fill #0
  Filled 2021-11-28: qty 90, 30d supply, fill #1
  Filled 2022-01-05: qty 90, 30d supply, fill #2

## 2021-10-28 ENCOUNTER — Other Ambulatory Visit: Payer: Self-pay

## 2021-10-31 ENCOUNTER — Other Ambulatory Visit: Payer: Self-pay

## 2021-11-07 ENCOUNTER — Telehealth: Payer: Self-pay | Admitting: Certified Nurse Midwife

## 2021-11-07 ENCOUNTER — Other Ambulatory Visit: Payer: Self-pay | Admitting: Gastroenterology

## 2021-11-07 ENCOUNTER — Other Ambulatory Visit: Payer: Self-pay | Admitting: Certified Nurse Midwife

## 2021-11-07 ENCOUNTER — Other Ambulatory Visit: Payer: Self-pay

## 2021-11-07 MED ORDER — PHENTERMINE HCL 37.5 MG PO TABS
37.5000 mg | ORAL_TABLET | Freq: Every day | ORAL | 0 refills | Status: DC
Start: 2021-11-07 — End: 2021-11-23
  Filled 2021-11-07: qty 30, 30d supply, fill #0

## 2021-11-07 NOTE — Telephone Encounter (Signed)
Pt states she only has 8 tablets left  and is requesting enough tablets until her appt for Weight and BP check on 6/28. Please advise

## 2021-11-08 ENCOUNTER — Other Ambulatory Visit: Payer: Self-pay | Admitting: Gastroenterology

## 2021-11-08 ENCOUNTER — Other Ambulatory Visit: Payer: Self-pay

## 2021-11-09 ENCOUNTER — Other Ambulatory Visit: Payer: Self-pay

## 2021-11-23 ENCOUNTER — Ambulatory Visit (INDEPENDENT_AMBULATORY_CARE_PROVIDER_SITE_OTHER): Payer: No Typology Code available for payment source | Admitting: Certified Nurse Midwife

## 2021-11-23 ENCOUNTER — Other Ambulatory Visit: Payer: Self-pay

## 2021-11-23 ENCOUNTER — Encounter: Payer: Self-pay | Admitting: Certified Nurse Midwife

## 2021-11-23 VITALS — BP 127/81 | HR 78 | Ht 59.0 in | Wt 144.4 lb

## 2021-11-23 DIAGNOSIS — Z7689 Persons encountering health services in other specified circumstances: Secondary | ICD-10-CM | POA: Diagnosis not present

## 2021-11-23 MED ORDER — PHENTERMINE HCL 37.5 MG PO TABS
37.5000 mg | ORAL_TABLET | Freq: Every day | ORAL | 0 refills | Status: DC
Start: 2021-11-23 — End: 2021-12-22
  Filled 2021-11-23 – 2021-12-15 (×3): qty 30, 30d supply, fill #0

## 2021-11-23 MED ORDER — CYANOCOBALAMIN 1000 MCG/ML IJ SOLN: 1000.0000 ug | Freq: Once | INTRAMUSCULAR | Status: AC
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
## 2021-11-23 NOTE — Patient Instructions (Signed)

## 2021-11-28 ENCOUNTER — Ambulatory Visit: Admit: 2021-11-28 | Discharge: 2021-11-28 | Payer: PRIVATE HEALTH INSURANCE | Attending: Specialist

## 2021-11-28 ENCOUNTER — Other Ambulatory Visit: Payer: Self-pay | Admitting: Gastroenterology

## 2021-11-28 DIAGNOSIS — Z01419 Encounter for gynecological examination (general) (routine) without abnormal findings: Secondary | ICD-10-CM

## 2021-11-28 NOTE — Progress Notes (Signed)
Subjective:      Patient ID: Olivia Castro is a 41 y.o. female.  Chief Complaint   Patient presents with    Annual Exam     BP 118/82 (Site: Left Upper Arm, Position: Sitting, Cuff Size: Medium Adult)   Ht 5\' 4"  (1.626 m)   Wt 174 lb (78.9 kg)   LMP 11/28/2021 (Exact Date)   Breastfeeding No   BMI 29.87 kg/m   Patient's last menstrual period was 11/28/2021 (exact date).    01/29/2022    No past medical history on file.  No current Epic-ordered outpatient medications on file.     No current Epic-ordered facility-administered medications on file.     Problem List Items Addressed This Visit    None  Visit Diagnoses       Well woman exam with routine gynecological exam    -  Primary          Allergies   Allergen Reactions    Iodine      No orders of the defined types were placed in this encounter.         HPI  Patient is here for an annual exam today.  Her last pap smear was done 07/12/2020 and was negative for intraepithelial lesion or malignancy and negative for HPV.  Patient is concerned about positive HSV testing in     Review of Systems   Constitutional:  Negative for activity change, appetite change and fever.   HENT:  Negative for ear discharge and ear pain.    Eyes:  Negative for pain and visual disturbance.   Respiratory:  Negative for apnea and cough.    Cardiovascular:  Negative for chest pain, palpitations and leg swelling.   Gastrointestinal:  Negative for abdominal distention, abdominal pain, constipation, diarrhea, nausea and vomiting.   Endocrine: Negative.    Genitourinary:  Negative for difficulty urinating, dysuria, menstrual problem and pelvic pain.   Musculoskeletal:  Negative for neck pain and neck stiffness.   Skin: Negative.    Neurological:  Negative for light-headedness and numbness.   Hematological: Negative.  Does not bruise/bleed easily.     Objective:   Physical Exam  Vitals and nursing note reviewed. Exam conducted with a chaperone present.   Constitutional:       Appearance: She  is well-developed.   HENT:      Head: Normocephalic and atraumatic.   Neck:      Thyroid: No thyroid mass or thyromegaly.   Cardiovascular:      Rate and Rhythm: Normal rate and regular rhythm.   Pulmonary:      Effort: Pulmonary effort is normal.      Breath sounds: Normal breath sounds.   Chest:   Breasts:     Right: Normal. No inverted nipple, mass, nipple discharge, skin change or tenderness.      Left: Normal. No inverted nipple, mass, nipple discharge, skin change or tenderness.   Abdominal:      General: Bowel sounds are normal. There is no distension.      Palpations: Abdomen is soft. There is no mass.      Tenderness: There is no abdominal tenderness. There is no guarding or rebound.      Hernia: There is no hernia in the left inguinal area.   Genitourinary:     General: Normal vulva.      Exam position: Supine.      Labia:         Right: No rash  or lesion.         Left: No rash or lesion.       Vagina: Normal. No signs of injury. No vaginal discharge or tenderness.      Cervix: No cervical motion tenderness or discharge.      Uterus: Normal. Not enlarged, not fixed and not tender.       Adnexa: Right adnexa normal and left adnexa normal.        Right: No mass or tenderness.          Left: No mass or tenderness.        Rectum: Normal. No mass or anal fissure. Normal anal tone.   Musculoskeletal:         General: No tenderness. Normal range of motion.   Skin:     General: Skin is warm and dry.   Neurological:      Mental Status: She is alert and oriented to person, place, and time.   Psychiatric:         Judgment: Judgment normal.       Assessment:      Patient with normal annual exam.  Pap smear was not done due to current ASCCP guidelines.  Discussed positive HSV-1 testing with patient and explained difference between HSV-I and HSV-II, which was negative.      Plan:      Appointment in 1 year    I, Meyer Cory, am scribing for, and in the presence of Dr. Erline Levine.   Electronically signed by:  Meyer Cory 11/28/21 2:59 PM       I agree to the above documentation placed by my scribe Meyer Cory. I reviewed the scribe's note and agree with the documented findings and plan of care. Any areas of disagreement are noted on the chart.   I have personally evaluated this patient. Additional findings are as noted. I agree with the chief complaint, past medical history, past surgical history, allergies, medications, social and family history as documented unless otherwise noted below.     Electronically signed by Erline Levine, MD on 11/29/2021 at 5:51 PM

## 2021-11-30 ENCOUNTER — Other Ambulatory Visit: Payer: Self-pay

## 2021-12-01 ENCOUNTER — Other Ambulatory Visit: Payer: Self-pay

## 2021-12-01 MED ORDER — LEVOCETIRIZINE DIHYDROCHLORIDE 5 MG PO TABS
ORAL_TABLET | ORAL | 3 refills | Status: DC
  Filled 2021-12-01: qty 30, 30d supply, fill #0
  Filled 2021-12-27: qty 30, 30d supply, fill #1
  Filled 2022-01-31: qty 30, 30d supply, fill #2
  Filled 2022-03-17: qty 30, 30d supply, fill #3

## 2021-12-01 MED ORDER — EPINEPHRINE 0.3 MG/0.3ML IJ SOAJ
INTRAMUSCULAR | 1 refills | Status: AC
  Filled 2021-12-01: qty 2, 30d supply, fill #0
  Filled 2022-01-05: qty 2, 30d supply, fill #1

## 2021-12-01 MED ORDER — AZELASTINE HCL 0.05 % OP SOLN
OPHTHALMIC | 3 refills | Status: AC
  Filled 2021-12-01: qty 6, 18d supply, fill #0
  Filled 2021-12-27: qty 6, 18d supply, fill #1
  Filled 2022-01-25: qty 6, 22d supply, fill #2
  Filled 2022-03-17: qty 6, 22d supply, fill #3

## 2021-12-01 MED ORDER — FLUTICASONE PROPIONATE 50 MCG/ACT NA SUSP
NASAL | 3 refills | Status: DC
  Filled 2021-12-01: qty 16, 30d supply, fill #0
  Filled 2021-12-27: qty 16, 30d supply, fill #1
  Filled 2022-01-31: qty 16, 60d supply, fill #2
  Filled 2022-04-13: qty 16, 60d supply, fill #3

## 2021-12-01 MED ORDER — MOMETASONE FUROATE 0.1 % EX CREA
TOPICAL_CREAM | CUTANEOUS | 1 refills | Status: AC
  Filled 2021-12-01: qty 90, 20d supply, fill #0
  Filled 2022-01-05: qty 90, 30d supply, fill #1

## 2021-12-01 MED ORDER — OMEPRAZOLE 40 MG PO CPDR
40.0000 mg | DELAYED_RELEASE_CAPSULE | Freq: Two times a day (BID) | ORAL | 3 refills | Status: DC
  Filled 2021-12-01: qty 180, 90d supply, fill #0
  Filled 2022-03-17: qty 180, 90d supply, fill #1

## 2021-12-06 ENCOUNTER — Other Ambulatory Visit: Payer: Self-pay

## 2021-12-12 NOTE — Progress Notes (Unsigned)
Virtual Visit via Video Note  I connected with Paula [name] on 12/14/21 at 10:30 AM EDT by a video enabled telemedicine application and verified that I am speaking with the correct person using two identifiers.  Location: Patient: home Provider: office Persons participated in the visit- patient, provider    I discussed the limitations of evaluation and management by telemedicine and the availability of in person appointments. The patient expressed understanding and agreed to proceed.    I discussed the assessment and treatment plan with the patient. The patient was provided an opportunity to ask questions and all were answered. The patient agreed with the plan and demonstrated an understanding of the instructions.   The patient was advised to call back or seek an in-person evaluation if the symptoms worsen or if the condition fails to improve as anticipated.  I provided 21 minutes of non-face-to-face time during this encounter.   Neysa Hotter, MD    [facility] MD/PA/NP OP Progress Note  12/14/2021 11:01 AM Paula [name]  MRN:  [id number]  Chief Complaint:  Chief Complaint  Patient presents with   Follow-up   Depression   Anxiety   HPI:  This is a follow-up appointment for depression and anxiety.  She states that she has been doing good.  She is concerning reducing her hours of work so that she does not need to call out to take care of her children when needed.  Although she feels stressed, she loves her work itself.  She also finds it helpful to have structure for daily routine for her children.  She had a big argument with her husband the other day.  She does not feel appreciated by him, although she contributes to her family.  She has not been able to see her couples therapist due to conflict in schedule.  She definitely thinks they need therapy, and is trying to make an appointment.  There was one time of having significant anxiety when she had to undergo allergy test  without being on Zyrtec.  She sleeps well as long as she takes [place].  She denies feeling fatigue.  She denies change in appetite.  She enjoyed going to a lake with her family and her friends.  She denies SI.  She denies a glass of wine only occasionally.  She has occasional nightmares.  She has flashback at times with certain smells.  She denies drug use.  At the end of the interview, she states that she has been able to get rid of things which was causing her stress at home.  She felt that a huge relief after cutting her hair, stating that she used to be thinking what others might say about her hair.  She feels comfortable to stay in the same medication regimen.   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 Her parents were never married. She had estranged relationship with her biological father since she was a child.  Visit Diagnosis:    ICD-10-CM   1. MDD (major depressive disorder), recurrent, in partial remission (HCC)  F33.41     2. PTSD (post-traumatic stress disorder)  F43.10     3. Insomnia, unspecified type  G47.00       Past Psychiatric History: Please see initial evaluation for full details.  I have reviewed the history. No updates at this time.     Past Medical History:  Past Medical History:  Diagnosis Date   Anemia    Asthma    exercise induced   BULIMIA 01/17/2008   Annotation: AGE 46 Qualifier: Diagnosis of  By: Lorrene Reid LPN, Wanda     Complication of anesthesia    vomited during last c/s   CPD (cephalo-pelvic disproportion) 12/22/2014   Cystic fibrosis carrier in second trimester, antepartum 05/29/2014   Depression    Eczema    GERD (gastroesophageal reflux disease)    chronic   Headache    h/o migraines   Hypothyroidism     Nausea    Oligomenorrhea 12/22/2014   PCOS (polycystic ovarian syndrome)    PONV (postoperative nausea and vomiting)    Rhinitis, allergic     Past Surgical History:  Procedure Laterality Date   CESAREAN SECTION  04/19/2013   LTCS; CPD   CESAREAN SECTION N/A 10/27/2015   Procedure: REPEAT CESAREAN SECTION;  Surgeon: Herold Harms, MD;  Location: [facility] OR[facility];  Service: Obstetrics;  Laterality: N/A;   ENDOSCOPIC TURBINATE REDUCTION Bilateral 07/26/2017   Procedure: ENDOSCOPIC INFERIOR  TURBINATE REDUCTION;  Surgeon: Vernie Murders, MD;  Location: [facility];  Service: ENT;  Laterality: Bilateral;   FRONTAL SINUS EXPLORATION Bilateral 07/26/2017   Procedure: FRONTAL SINUS EXPLORATION;  Surgeon: Vernie Murders, MD;  Location: [name];  Service: ENT;  Laterality: Bilateral;   IMAGE GUIDED SINUS SURGERY Bilateral 07/26/2017   Procedure: IMAGE GUIDED SINUS SURGERY;  Surgeon: Vernie Murders, MD;  Location: [facility];  Service: ENT;  Laterality: Bilateral;  GAVE DISK TO CECE 2-12   SEPTOPLASTY WITH ETHMOIDECTOMY, AND MAXILLARY ANTROSTOMY Bilateral 07/26/2017   Procedure: SEPTOPLASTY WITH TOTAL  ETHMOIDECTOMY, AND MAXILLARY ANTROSTOMYWITH REMOVAL OF TISSUE,;  Surgeon: Vernie Murders, MD;  Location: [facility];  Service: ENT;  Laterality: Bilateral;   TONSILLECTOMY     WISDOM TOOTH EXTRACTION      Family Psychiatric History: Please see initial evaluation for full details. I have reviewed the history. No updates at this time.     Family History:  Family History  Problem Relation Age of Onset   Endometriosis Mother    Bipolar disorder Mother    Alcohol abuse Mother    Heart disease Maternal Grandmother        valvular problem   Depression Maternal Grandmother    Breast cancer Neg Hx    Ovarian cancer Neg Hx    Colon cancer Neg Hx     Social History:  Social History   Socioeconomic History   Marital status: Married    Spouse name: Not on file    Number of children: Not on file   Years of education: Not on file   Highest education level: Not on file  Occupational History   Occupation: ED tech    Employer: [employer]  Tobacco Use   Smoking status: Never   Smokeless tobacco: Never  Vaping Use   Vaping Use: Never used  Substance and Sexual Activity   Alcohol use: Yes    Alcohol/week: 2.0 standard drinks of alcohol    Types: 2 Standard drinks or equivalent per week    Comment: Social   Drug use: No   Sexual activity: Yes    Partners: Male    Birth control/protection: Spermicide, Coitus interruptus  Other Topics Concern   Not on file  Social History Narrative   Not on file   Social  Determinants of Health   Financial Resource Strain: Not on file  Food Insecurity: Not on file  Transportation Needs: Not on file  Physical Activity: Not on file  Stress: Not on file  Social Connections: Not on file    Allergies:  Allergies  Allergen Reactions   Iodine Anaphylaxis    Contrast    Shellfish Allergy Hives   Contrast Media [Iodinated Contrast Media]    Flagyl [Metronidazole]     Metabolic Disorder Labs: Lab Results  Component Value Date   HGBA1C 5.2 08/10/2021   Lab Results  Component Value Date   PROLACTIN 35.8 (H) 03/14/2016   PROLACTIN 43.4 12/27/2007   Lab Results  Component Value Date   CHOL 293 (H) 08/10/2021   TRIG 92 08/10/2021   HDL 64 08/10/2021   CHOLHDL 4.6 (H) 08/10/2021   VLDL 17 09/25/2011   LDLCALC 214 (H) 08/10/2021   LDLCALC 178 (H) 03/26/2019   Lab Results  Component Value Date   TSH 2.630 08/10/2021   TSH 2.070 10/06/2020    Therapeutic Level Labs: No results found for: "LITHIUM" No results found for: "VALPROATE" No results found for: "CBMZ"  Current Medications: Current Outpatient Medications  Medication Sig Dispense Refill   albuterol (PROVENTIL HFA;VENTOLIN HFA) 108 (90 Base) MCG/ACT inhaler Inhale 2 puffs into the lungs every 6 (six) hours as needed for wheezing or shortness of  breath.     ARAZLO 0.045 % LOTN Apply topically.     azelastine (OPTIVAR) 0.05 % ophthalmic solution 1 drop into affected eye Ophthalmic Twice a day 30 days 6 mL 3   cetirizine (ZYRTEC) 10 MG tablet Take 10 mg by mouth daily.     Drospirenone (SLYND) 4 MG TABS Take 1 tablet by mouth daily. 28 tablet 11   EPINEPHrine 0.3 mg/0.3 mL IJ SOAJ injection as directed Injection prn 30 days 2 each 1   EPSOLAY 5 % cream Apply 1 Application topically every morning.     [START ON 01/01/2022] eszopiclone (LUNESTA) 1 MG TABS tablet Take 1 tablet (1 mg total) by mouth at bedtime as needed for sleep. Take immediately before bedtime 30 tablet 2   famotidine (PEPCID) 40 MG tablet Take 40 mg by mouth as needed for heartburn or indigestion.     fluticasone (FLONASE) 50 MCG/ACT nasal spray 1 spray in each nostril Nasally Once a day 30 day(s) 16 g 3   levocetirizine (XYZAL) 5 MG tablet 1 tablet in the evening Orally Once a day 30 day(s) 30 tablet 3   levothyroxine (SYNTHROID) 75 MCG tablet TAKE 1 TABLET BY MOUTH DAILY BEFORE BREAKFAST 30 tablet 11   mometasone (ELOCON) 0.1 % cream 1 application Externally Twice a day 30 days 90 g 1   montelukast (SINGULAIR) 10 MG tablet Take 10 mg by mouth as needed.     omeprazole (PRILOSEC) 40 MG capsule Take 1 capsule (40 mg total) by mouth 2 (two) times daily. 180 capsule 3   phentermine (ADIPEX-P) 37.5 MG tablet Take 1 tablet (37.5 mg total) by mouth daily before breakfast. 30 tablet 0   spironolactone (ALDACTONE) 100 MG tablet TAKE 2 TABLETS BY MOUTH DAILY 180 tablet 3   triamcinolone (NASACORT) 55 MCG/ACT AERO nasal inhaler Place into the nose.     [START ON 02/03/2022] venlafaxine XR (EFFEXOR-XR) 75 MG 24 hr capsule Take 3 capsules (225 mg total) by mouth daily with breakfast. 90 capsule 2   Current Facility-Administered Medications  Medication Dose Route Frequency Provider Last Rate Last Admin  cyanocobalamin ((VITAMIN B-12)) injection 1,000 mcg  1,000 mcg Intramuscular Once  Doreene Burke, CNM         Musculoskeletal: Strength & Muscle Tone: N/A Gait & Station:  N/A Patient leans: N/A  Psychiatric Specialty Exam: Review of Systems  Psychiatric/Behavioral:  Positive for decreased concentration. Negative for agitation, behavioral problems, confusion, dysphoric mood, hallucinations, self-injury, sleep disturbance and suicidal ideas. The patient is nervous/anxious. The patient is not hyperactive.   All other systems reviewed and are negative.   There were no vitals taken for this visit.There is no height or weight on file to calculate BMI.  General Appearance: Fairly Groomed  Eye Contact:  Good  Speech:  Clear and Coherent  Volume:  Normal  Mood:   good  Affect:  Appropriate, Congruent, and calm  Thought Process:  Coherent  Orientation:  Full (Time, Place, and Person)  Thought Content: Logical   Suicidal Thoughts:  No  Homicidal Thoughts:  No  Memory:  Immediate;   Good  Judgement:  Good  Insight:  Good  Psychomotor Activity:  Normal  Concentration:  Concentration: Good and Attention Span: Good  Recall:  Good  Fund of Knowledge: Good  Language: Good  Akathisia:  No  Handed:  Right  AIMS (if indicated): not done  Assets:  Communication Skills Desire for Improvement  ADL's:  Intact  Cognition: WNL  Sleep:  Good   Screenings: GAD-7    Flowsheet Row Office Visit from 06/16/2020 in [facility] Office Visit from 04/28/2020 in [facility]  Total GAD-7 Score 7 8      PHQ2-9    Flowsheet Row Office Visit from 10/20/2021 in [facility] Video Visit from 07/27/2021 in [facility] Video Visit from 03/30/2021 in [facility] Video Visit from 01/26/2021 in [facility] Video Visit from 11/10/2020 in [facility]  PHQ-2 Total Score 0 1 0 0 0  PHQ-9 Total Score 1 -- -- -- --      Flowsheet Row  Video Visit from 03/30/2021 in [facility] Video Visit from 11/10/2020 in [facility] Video Visit from 10/06/2020 in [facility]  C-SSRS RISK CATEGORY No Risk No Risk No Risk        Assessment and Plan:  YENTL VERGE is a 41 y.o. year old female with a history of PTSD, depression, anxiety, GERD, hypothyroidism, PCOS, who presents for follow up appointment for below.   1. MDD (major depressive disorder), recurrent, in partial remission (HCC) 2. PTSD (post-traumatic stress disorder) There has been steady improvement in irritability, depressive and PTSD symptoms since the last visit. Psychosocial stressors includes marital conflict, stress at work, and childhood trauma.  Will continue current dose of venlafaxine to target depression and PTSD.  Although she has not been able to see her couples therapist due to conflict in schedule, she is willing to continue the treatment.   # Insomnia Improving since switching from Ambien to [facility].  Will continue current dose to target insomnia.    Plan Continue venlafaxine 225 mg daily Continue Lunesta 1 mg at night as needed for insomnia Next appointment:  10/11 at 10 AM for 30 mins, video   Past trials of medication: sertraline, fluoxetine (nightmares), L-theanine , Ambien, Trazodone   The patient demonstrates the following risk factors for suicide: Chronic risk factors for suicide include: psychiatric disorder of depression, anxiety. Acute risk factors for suicide include: N/A. Protective factors for  this patient include: positive social support, coping skills and hope for the future. Considering these factors, the overall suicide risk at this point appears to be low. Patient is appropriate for outpatient follow up.         Collaboration of Care: Collaboration of Care: Other N/A  Patient/Guardian was advised Release of Information must be obtained prior to any  record release in order to collaborate their care with an outside provider. Patient/Guardian was advised if they have not already done so to contact the registration department to sign all necessary forms in order for us to release information regarding their care.   Consent: Patient/Guardian gives verbal consent for treatment and assignment of benefits for services provided during this visit. Patient/Guardian expressed understanding and agreed to proceed.    Neysa Hottereina Imir Brumbach, MD 12/14/2021, 11:01 AM

## 2021-12-14 ENCOUNTER — Other Ambulatory Visit: Payer: Self-pay

## 2021-12-14 ENCOUNTER — Telehealth (INDEPENDENT_AMBULATORY_CARE_PROVIDER_SITE_OTHER): Payer: No Typology Code available for payment source | Admitting: Psychiatry

## 2021-12-14 ENCOUNTER — Encounter: Payer: Self-pay | Admitting: Psychiatry

## 2021-12-14 DIAGNOSIS — F3341 Major depressive disorder, recurrent, in partial remission: Secondary | ICD-10-CM | POA: Diagnosis not present

## 2021-12-14 DIAGNOSIS — F4310 Post-traumatic stress disorder, unspecified: Secondary | ICD-10-CM | POA: Diagnosis not present

## 2021-12-14 DIAGNOSIS — G4700 Insomnia, unspecified: Secondary | ICD-10-CM | POA: Diagnosis not present

## 2021-12-14 MED ORDER — VENLAFAXINE HCL ER 75 MG PO CP24
225.0000 mg | ORAL_CAPSULE | Freq: Every day | ORAL | 2 refills | Status: DC
  Filled 2021-12-14 – 2022-02-14 (×3): qty 90, 30d supply, fill #0
  Filled 2022-03-17 – 2022-05-12 (×2): qty 90, 30d supply, fill #1
  Filled 2022-06-17 – 2022-09-22 (×4): qty 90, 30d supply, fill #2

## 2021-12-14 MED ORDER — ESZOPICLONE 1 MG PO TABS
1.0000 mg | ORAL_TABLET | Freq: Every evening | ORAL | 2 refills | Status: DC | PRN
  Filled 2021-12-14 – 2022-01-25 (×2): qty 30, 30d supply, fill #0
  Filled 2022-03-17: qty 30, 30d supply, fill #1

## 2021-12-14 NOTE — Patient Instructions (Signed)
Continue venlafaxine 225 mg daily Continue Lunesta 1 mg at night as needed for insomnia Next appointment:  10/11 at 10 AM

## 2021-12-15 ENCOUNTER — Other Ambulatory Visit: Payer: Self-pay

## 2021-12-22 ENCOUNTER — Encounter: Payer: Self-pay | Admitting: Certified Nurse Midwife

## 2021-12-22 ENCOUNTER — Other Ambulatory Visit: Payer: Self-pay

## 2021-12-22 ENCOUNTER — Ambulatory Visit (INDEPENDENT_AMBULATORY_CARE_PROVIDER_SITE_OTHER): Payer: No Typology Code available for payment source | Admitting: Certified Nurse Midwife

## 2021-12-22 VITALS — BP 129/83 | HR 64 | Ht 59.0 in | Wt 140.0 lb

## 2021-12-22 DIAGNOSIS — Z7689 Persons encountering health services in other specified circumstances: Secondary | ICD-10-CM | POA: Diagnosis not present

## 2021-12-22 MED ORDER — CYANOCOBALAMIN 1000 MCG/ML IJ SOLN
1000.0000 ug | Freq: Once | INTRAMUSCULAR | Status: AC
  Administered 2021-12-22: 1000 ug via INTRAMUSCULAR

## 2021-12-22 MED ORDER — PHENTERMINE HCL 37.5 MG PO TABS
37.5000 mg | ORAL_TABLET | Freq: Every day | ORAL | 0 refills | Status: DC
  Filled 2021-12-22 – 2022-01-12 (×5): qty 30, 30d supply, fill #0

## 2021-12-22 NOTE — Progress Notes (Signed)
SUBJECTIVE:  41 y.o. here for follow-up weight loss visit, previously seen 4 weeks ago. Denies any concerns and feels like medication is working well, she denies any negative side effect with medication use. She state she was rushing to the appointment and that is why her BP was elevated. Rpt WNL.   OBJECTIVE:  BP (!) 136/93   Pulse 64   Ht 4\' 11"  (1.499 m)   Wt 140 lb (63.5 kg)   BMI 28.28 kg/m   Body mass index is 28.28 kg/m. Patient appears well. Rpt bp 129/83 Waist:34 ASSESSMENT:  Obesity- responding well to weight loss plan PLAN:  To continue with current medications. B12 1073mcg/ml injection given RTC in 4 weeks as planned  80m, CNM  CNM

## 2021-12-27 ENCOUNTER — Other Ambulatory Visit: Payer: Self-pay

## 2021-12-28 ENCOUNTER — Other Ambulatory Visit: Payer: Self-pay

## 2021-12-29 ENCOUNTER — Other Ambulatory Visit: Payer: Self-pay

## 2022-01-05 ENCOUNTER — Other Ambulatory Visit: Payer: Self-pay

## 2022-01-06 ENCOUNTER — Other Ambulatory Visit: Payer: Self-pay

## 2022-01-12 ENCOUNTER — Other Ambulatory Visit: Payer: Self-pay

## 2022-01-13 ENCOUNTER — Other Ambulatory Visit: Payer: Self-pay

## 2022-01-25 ENCOUNTER — Other Ambulatory Visit: Payer: Self-pay | Admitting: Certified Nurse Midwife

## 2022-01-25 ENCOUNTER — Ambulatory Visit (INDEPENDENT_AMBULATORY_CARE_PROVIDER_SITE_OTHER): Payer: No Typology Code available for payment source | Admitting: Certified Nurse Midwife

## 2022-01-25 ENCOUNTER — Other Ambulatory Visit: Payer: Self-pay | Admitting: Psychiatry

## 2022-01-25 ENCOUNTER — Encounter: Payer: Self-pay | Admitting: Certified Nurse Midwife

## 2022-01-25 ENCOUNTER — Other Ambulatory Visit: Payer: Self-pay

## 2022-01-25 VITALS — BP 120/82 | HR 82 | Ht 59.0 in | Wt 141.3 lb

## 2022-01-25 DIAGNOSIS — Z7689 Persons encountering health services in other specified circumstances: Secondary | ICD-10-CM | POA: Diagnosis not present

## 2022-01-25 MED ORDER — PHENTERMINE HCL 37.5 MG PO TABS
37.5000 mg | ORAL_TABLET | Freq: Every day | ORAL | 0 refills | Status: DC
  Filled 2022-01-25 – 2022-02-14 (×3): qty 30, 30d supply, fill #0

## 2022-01-25 MED ORDER — CYANOCOBALAMIN 1000 MCG/ML IJ SOLN
1000.0000 ug | Freq: Once | INTRAMUSCULAR | Status: AC
  Administered 2022-01-25: 1000 ug via INTRAMUSCULAR

## 2022-01-25 MED ORDER — CYANOCOBALAMIN 1000 MCG/ML IJ SOLN
1000.0000 ug | Freq: Once | INTRAMUSCULAR | 5 refills | Status: AC
  Filled 2022-01-25 – 2022-02-23 (×2): qty 1, 1d supply, fill #0

## 2022-01-25 NOTE — Patient Instructions (Signed)

## 2022-01-25 NOTE — Progress Notes (Signed)
SUBJECTIVE:  41 y.o. here for follow-up weight loss visit, previously seen 4 weeks ago. Denies any concerns and feels like medication is working well. She gained 1 lb this past month but feels like it is water wait as she is about to start her cycle and she has lost inches from her waist.   OBJECTIVE:  BP 120/82   Pulse 82   Ht 4\' 11"  (1.499 m)   Wt 141 lb 4.8 oz (64.1 kg)   LMP 01/08/2022 (Approximate)   BMI 28.54 kg/m   Body mass index is 28.54 kg/m.  Waist: 32.75 in   Patient appears well. ASSESSMENT:  Obesity- responding well to weight loss plan PLAN:  To continue with current medications. B12 1014mcg/ml injection given RTC in 4 weeks as planned  80m, CNM

## 2022-01-26 ENCOUNTER — Other Ambulatory Visit: Payer: Self-pay

## 2022-01-31 ENCOUNTER — Other Ambulatory Visit: Payer: Self-pay

## 2022-01-31 ENCOUNTER — Other Ambulatory Visit: Payer: Self-pay | Admitting: Psychiatry

## 2022-02-06 ENCOUNTER — Other Ambulatory Visit: Payer: Self-pay

## 2022-02-09 ENCOUNTER — Other Ambulatory Visit: Payer: Self-pay

## 2022-02-14 ENCOUNTER — Other Ambulatory Visit: Payer: Self-pay | Admitting: Psychiatry

## 2022-02-14 ENCOUNTER — Other Ambulatory Visit: Payer: Self-pay

## 2022-02-20 ENCOUNTER — Other Ambulatory Visit: Payer: Self-pay

## 2022-02-20 ENCOUNTER — Telehealth: Payer: No Typology Code available for payment source | Admitting: Physician Assistant

## 2022-02-20 DIAGNOSIS — R3989 Other symptoms and signs involving the genitourinary system: Secondary | ICD-10-CM | POA: Diagnosis not present

## 2022-02-20 MED ORDER — CEPHALEXIN 500 MG PO CAPS
500.0000 mg | ORAL_CAPSULE | Freq: Two times a day (BID) | ORAL | 0 refills | Status: DC
  Filled 2022-02-20: qty 14, 7d supply, fill #0

## 2022-02-20 NOTE — Progress Notes (Signed)

## 2022-02-23 ENCOUNTER — Other Ambulatory Visit: Payer: Self-pay

## 2022-02-24 ENCOUNTER — Other Ambulatory Visit: Payer: Self-pay | Admitting: Psychiatry

## 2022-02-24 ENCOUNTER — Other Ambulatory Visit: Payer: Self-pay

## 2022-02-24 MED ORDER — VENLAFAXINE HCL ER 75 MG PO CP24
225.0000 mg | ORAL_CAPSULE | Freq: Every day | ORAL | 2 refills | Status: DC
  Filled 2022-02-24 – 2022-03-17 (×2): qty 90, 30d supply, fill #0
  Filled 2022-04-13: qty 90, 30d supply, fill #1
  Filled 2022-06-17 – 2022-06-20 (×2): qty 90, 30d supply, fill #2

## 2022-03-02 ENCOUNTER — Other Ambulatory Visit: Payer: Self-pay

## 2022-03-07 NOTE — Progress Notes (Unsigned)
Virtual Visit via Video Note  I connected with Paula [name] on 03/08/22 at 10:00 AM EDT by a video enabled telemedicine application and verified that I am speaking with the correct person using two identifiers.  Location: Patient: home Provider: office Persons participated in the visit- patient, provider    I discussed the limitations of evaluation and management by telemedicine and the availability of in person appointments. The patient expressed understanding and agreed to proceed.    I discussed the assessment and treatment plan with the patient. The patient was provided an opportunity to ask questions and all were answered. The patient agreed with the plan and demonstrated an understanding of the instructions.   The patient was advised to call back or seek an in-person evaluation if the symptoms worsen or if the condition fails to improve as anticipated.  I provided 25 minutes of non-face-to-face time during this encounter.   Neysa Hotter, MD    [facility] MD/PA/NP OP Progress Note  03/08/2022 10:34 AM [name]  MRN:  [id number]  Chief Complaint:  Chief Complaint  Patient presents with   Follow-up   HPI:  This is a follow-up appointment for depression, PTSD and then insomnia.  She states that she has high anxiety.  There was some issues with flooring in the bathroom.  She also states that her mother took extra medication/overdose, and was brought to the hospital.  Her brother was also found to have altered mental status.  She feels frustrated with this, stating that her grandparents being worried due to her mother.  She cried 5 hours after she found this out.  She also states that her daughter is asking about her mother.  She reports frustration at home.  She does not feel appreciated.  She was told by her husband that she has been hateful.  She has been taking venlafaxine 150 mg instead of 225 as she felt she lost herself.  However, she does not feel better at lower  dose.  She denies any other side effect.  She agrees to try higher dose again at this time.  She drinks 2 glasses of wine a few times per week.  She denies drug use or cigarette use.   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 Her parents were never married. She had estranged relationship with her biological father since she was a child.  Visit Diagnosis:    ICD-10-CM   1. MDD (major depressive disorder), recurrent episode, mild (HCC)  F33.0     2. PTSD (post-traumatic stress disorder)  F43.10     3. GAD (generalized anxiety disorder)  F41.1       Past Psychiatric History: Please see initial evaluation for full details. I have reviewed the history. No updates at this time.     Past Medical History:  Past Medical History:  Diagnosis Date   Anemia    Asthma    exercise induced   BULIMIA 01/17/2008   Annotation: AGE 48 Qualifier: Diagnosis of  By: Lorrene Reid LPN, Wanda     Complication of anesthesia  vomited during last c/s   CPD (cephalo-pelvic disproportion) 12/22/2014   Cystic fibrosis carrier in second trimester, antepartum 05/29/2014   Depression    Eczema    GERD (gastroesophageal reflux disease)    chronic   Headache    h/o migraines   Hypothyroidism    Nausea    Oligomenorrhea 12/22/2014   PCOS (polycystic ovarian syndrome)    PONV (postoperative nausea and vomiting)    Rhinitis, allergic     Past Surgical History:  Procedure Laterality Date   CESAREAN SECTION  04/19/2013   LTCS; CPD   CESAREAN SECTION N/A 10/27/2015   Procedure: REPEAT CESAREAN SECTION;  Surgeon: Herold Harms, MD;  Location: [facility];  Service: Obstetrics;  Laterality: N/A;   ENDOSCOPIC TURBINATE REDUCTION Bilateral 07/26/2017   Procedure: ENDOSCOPIC INFERIOR  TURBINATE REDUCTION;  Surgeon: Vernie Murders, MD;  Location: [facility];  Service: ENT;  Laterality: Bilateral;   FRONTAL SINUS EXPLORATION Bilateral 07/26/2017   Procedure: FRONTAL SINUS EXPLORATION;  Surgeon: Vernie Murders, MD;  Location: [facility];  Service: ENT;  Laterality: Bilateral;   IMAGE GUIDED SINUS SURGERY Bilateral 07/26/2017   Procedure: IMAGE GUIDED SINUS SURGERY;  Surgeon: Vernie Murders, MD;  Location: [facility];  Service: ENT;  Laterality: Bilateral;  GAVE DISK TO CECE 2-12   SEPTOPLASTY WITH ETHMOIDECTOMY, AND MAXILLARY ANTROSTOMY Bilateral 07/26/2017   Procedure: SEPTOPLASTY WITH TOTAL  ETHMOIDECTOMY, AND MAXILLARY ANTROSTOMYWITH REMOVAL OF TISSUE,;  Surgeon: Vernie Murders, MD;  Location: [facility];  Service: ENT;  Laterality: Bilateral;   TONSILLECTOMY     WISDOM TOOTH EXTRACTION      Family Psychiatric History: Please see initial evaluation for full details. I have reviewed the history. No updates at this time.     Family History:  Family History  Problem Relation Age of Onset   Endometriosis Mother    Bipolar disorder Mother    Alcohol abuse Mother    Heart disease Maternal Grandmother        valvular problem   Depression Maternal Grandmother    Breast cancer Neg Hx    Ovarian cancer Neg Hx    Colon cancer Neg Hx     Social History:  Social History   Socioeconomic History   Marital status: Married    Spouse name: Not on file   Number of children: Not on file   Years of education: Not on file   Highest education level: Not on file  Occupational History   Occupation: ED tech    Employer: [employer]  Tobacco Use   Smoking status: Never   Smokeless tobacco: Never  Vaping Use   Vaping Use: Never used  Substance and Sexual Activity   Alcohol use: Yes     Alcohol/week: 2.0 standard drinks of alcohol    Types: 2 Standard drinks or equivalent per week    Comment: Social   Drug use: No   Sexual activity: Yes    Partners: Male    Birth control/protection: Spermicide, Coitus interruptus  Other Topics Concern   Not on file  Social History Narrative   Not on file   Social Determinants of Health   Financial Resource Strain: Not on file  Food Insecurity: Not on file  Transportation Needs: Not on file  Physical Activity: Not on file  Stress: Not on file  Social Connections: Not on file    Allergies:  Allergies  Allergen Reactions   Iodine Anaphylaxis    Contrast    Shellfish Allergy Hives  Contrast Media [Iodinated Contrast Media]    Flagyl [Metronidazole]     Metabolic Disorder Labs: Lab Results  Component Value Date   HGBA1C 5.2 08/10/2021   Lab Results  Component Value Date   PROLACTIN 35.8 (H) 03/14/2016   PROLACTIN 43.4 12/27/2007   Lab Results  Component Value Date   CHOL 293 (H) 08/10/2021   TRIG 92 08/10/2021   HDL 64 08/10/2021   CHOLHDL 4.6 (H) 08/10/2021   VLDL 17 09/25/2011   LDLCALC 214 (H) 08/10/2021   LDLCALC 178 (H) 03/26/2019   Lab Results  Component Value Date   TSH 2.630 08/10/2021   TSH 2.070 10/06/2020    Therapeutic Level Labs: No results found for: "LITHIUM" No results found for: "VALPROATE" No results found for: "CBMZ"  Current Medications: Current Outpatient Medications  Medication Sig Dispense Refill   albuterol (PROVENTIL HFA;VENTOLIN HFA) 108 (90 Base) MCG/ACT inhaler Inhale 2 puffs into the lungs every 6 (six) hours as needed for wheezing or shortness of breath.     ARAZLO 0.045 % LOTN Apply topically.     azelastine (OPTIVAR) 0.05 % ophthalmic solution 1 drop into affected eye Ophthalmic Twice a day 30 days 6 mL 3   cephALEXin (KEFLEX) 500 MG capsule Take 1 capsule (500 mg total) by mouth 2 (two) times daily. 14 capsule 0   Drospirenone (SLYND) 4 MG TABS Take 1 tablet by mouth  daily. 28 tablet 11   EPINEPHrine 0.3 mg/0.3 mL IJ SOAJ injection as directed Injection prn 30 days 2 each 1   EPSOLAY 5 % cream Apply 1 Application topically every morning.     eszopiclone (LUNESTA) 1 MG TABS tablet Take 1 tablet (1 mg total) by mouth at bedtime as needed for sleep. Take immediately before bedtime 30 tablet 2   fluticasone (FLONASE) 50 MCG/ACT nasal spray 1 spray in each nostril Nasally Once a day 30 day(s) 16 g 3   levocetirizine (XYZAL) 5 MG tablet 1 tablet in the evening Orally Once a day 30 day(s) 30 tablet 3   levothyroxine (SYNTHROID) 75 MCG tablet TAKE 1 TABLET BY MOUTH DAILY BEFORE BREAKFAST 30 tablet 11   mometasone (ELOCON) 0.1 % cream 1 application Externally Twice a day 30 days 90 g 1   montelukast (SINGULAIR) 10 MG tablet Take 10 mg by mouth as needed.     omeprazole (PRILOSEC) 40 MG capsule Take 1 capsule (40 mg total) by mouth 2 (two) times daily. 180 capsule 3   phentermine (ADIPEX-P) 37.5 MG tablet Take 1 tablet (37.5 mg total) by mouth daily before breakfast. 30 tablet 0   spironolactone (ALDACTONE) 100 MG tablet TAKE 2 TABLETS BY MOUTH DAILY 180 tablet 3   venlafaxine XR (EFFEXOR-XR) 75 MG 24 hr capsule Take 3 capsules (225 mg total) by mouth daily with breakfast. 90 capsule 2   venlafaxine XR (EFFEXOR-XR) 75 MG 24 hr capsule Take 3 capsules (225 mg total) by mouth daily with breakfast. 90 capsule 2   Current Facility-Administered Medications  Medication Dose Route Frequency Provider Last Rate Last Admin   cyanocobalamin ((VITAMIN B-12)) injection 1,000 mcg  1,000 mcg Intramuscular Once Doreene Burkehompson, Annie, CNM         Musculoskeletal: Strength & Muscle Tone:  N/A Gait & Station:  N/A Patient leans: N/A  Psychiatric Specialty Exam: Review of Systems  Psychiatric/Behavioral:  Positive for dysphoric mood and sleep disturbance. Negative for agitation, behavioral problems, confusion, decreased concentration, hallucinations, self-injury and suicidal ideas. The  patient is nervous/anxious. The patient  is not hyperactive.   All other systems reviewed and are negative.   There were no vitals taken for this visit.There is no height or weight on file to calculate BMI.  General Appearance: Fairly Groomed  Eye Contact:  Good  Speech:  Clear and Coherent  Volume:  Normal  Mood:  Anxious  Affect:  Appropriate, Congruent, and tense  Thought Process:  Coherent  Orientation:  Full (Time, Place, and Person)  Thought Content: Logical   Suicidal Thoughts:  No  Homicidal Thoughts:  No  Memory:  Immediate;   Good  Judgement:  Good  Insight:  Good  Psychomotor Activity:  Normal  Concentration:  Concentration: Good and Attention Span: Good  Recall:  Good  Fund of Knowledge: Good  Language: Good  Akathisia:  No  Handed:  Right  AIMS (if indicated): not done  Assets:  Communication Skills Desire for Improvement  ADL's:  Intact  Cognition: WNL  Sleep:  Poor   Screenings: GAD-7    Flowsheet Row Office Visit from 06/16/2020 in [facility] Visit from 04/28/2020 in [facility]  Total GAD-7 Score 7 8      PHQ2-9    [place] Office Visit from 10/20/2021 in [place] Video Visit from 07/27/2021 in [place] Video Visit from 03/30/2021 in [place] Video Visit from 01/26/2021 in [place] Video Visit from 11/10/2020 in [place]  PHQ-2 Total Score 0 1 0 0 0  PHQ-9 Total Score 1 -- -- -- --      Flowsheet Row Video Visit from 03/30/2021 in [place] Video Visit from 11/10/2020 in [place] Video Visit from 10/06/2020 in [place] No Risk No Risk No Risk        Assessment and Plan:  ELAH AVELLINO is a 41 y.o. year old female with a history of PTSD,  depression, anxiety, GERD, hypothyroidism, PCOS , who presents for follow up appointment for below.   1. MDD (major depressive disorder), recurrent episode, mild ([place]) 2. PTSD (post-traumatic stress disorder) 3. GAD (generalized anxiety disorder) There has been significant worsening in anxiety and depressive symptoms in the context of her mother overdosing, and self tapering venlafaxine. Psychosocial stressors includes marital conflict, stress at work, and childhood trauma.  After having discussion a month, she is willing to retry higher dose of venlafaxine given she denies any adverse reaction except that she did not feel like herself, which may be more attributable to situational stress.  She was advised to continue couple therapy.   # Insomnia Stable.  She reports great benefit from [place].  Will continue current dose to target insomnia.    Plan Increase venlafaxine 225 mg daily Continue Lunesta 1 mg at night as needed for insomnia Next appointment:  11/15 at 10:30  for 30 mins, video - on phentermine   Past trials of medication: sertraline, fluoxetine (nightmares), L-theanine , Ambien, Trazodone   The patient demonstrates the following risk factors for suicide: Chronic risk factors for suicide include: psychiatric disorder of depression, anxiety. Acute risk factors for suicide include: N/A. Protective factors for this patient include: positive social support, coping skills and hope for the future. Considering these factors, the overall suicide risk at this point appears to be low. Patient is appropriate for outpatient follow up.           Collaboration of Care: Collaboration of  Care: Other N/A  Patient/Guardian was advised Release of Information must be obtained prior to any record release in order to collaborate their care with an outside provider. Patient/Guardian was advised if they have not already done so to contact the registration department to sign all necessary forms in order  for [place] to release information regarding their care.   Consent: Patient/Guardian gives verbal consent for treatment and assignment of benefits for services provided during this visit. Patient/Guardian expressed understanding and agreed to proceed.    Neysa Hotter, MD 03/08/2022, 10:34 AM

## 2022-03-08 ENCOUNTER — Telehealth (INDEPENDENT_AMBULATORY_CARE_PROVIDER_SITE_OTHER): Payer: No Typology Code available for payment source | Admitting: Psychiatry

## 2022-03-08 ENCOUNTER — Encounter: Payer: Self-pay | Admitting: Psychiatry

## 2022-03-08 DIAGNOSIS — F330 Major depressive disorder, recurrent, mild: Secondary | ICD-10-CM | POA: Diagnosis not present

## 2022-03-08 DIAGNOSIS — F4310 Post-traumatic stress disorder, unspecified: Secondary | ICD-10-CM | POA: Diagnosis not present

## 2022-03-08 DIAGNOSIS — F411 Generalized anxiety disorder: Secondary | ICD-10-CM | POA: Diagnosis not present

## 2022-03-08 NOTE — Patient Instructions (Signed)
Increase venlafaxine 225 mg daily Continue Lunesta 1 mg at night as needed for insomnia Next appointment:  11/15 at 10:30

## 2022-03-14 ENCOUNTER — Other Ambulatory Visit: Payer: Self-pay

## 2022-03-17 ENCOUNTER — Other Ambulatory Visit: Payer: Self-pay | Admitting: Certified Nurse Midwife

## 2022-03-17 ENCOUNTER — Other Ambulatory Visit: Payer: Self-pay

## 2022-03-19 ENCOUNTER — Other Ambulatory Visit: Payer: Self-pay | Admitting: Certified Nurse Midwife

## 2022-03-19 ENCOUNTER — Other Ambulatory Visit: Payer: Self-pay

## 2022-03-20 ENCOUNTER — Other Ambulatory Visit: Payer: Self-pay

## 2022-03-20 MED ORDER — OMEPRAZOLE 40 MG PO CPDR: 40.0000 mg | DELAYED_RELEASE_CAPSULE | Freq: Two times a day (BID) | ORAL | 3 refills | Status: DC

## 2022-03-21 ENCOUNTER — Other Ambulatory Visit: Payer: Self-pay

## 2022-03-21 ENCOUNTER — Other Ambulatory Visit: Payer: Self-pay | Admitting: Certified Nurse Midwife

## 2022-04-06 NOTE — Progress Notes (Deleted)
BH MD/PA/NP OP Progress Note  04/06/2022 12:00 PM Paula [name]  MRN:  [id number]  Chief Complaint: No chief complaint on file.  HPI: *** Visit Diagnosis: No diagnosis found.  Past Psychiatric History: Please see initial evaluation for full details. I have reviewed the history. No updates at this time.     Past Medical History:  Past Medical History:  Diagnosis Date   Anemia    Asthma    exercise induced   BULIMIA 01/17/2008   Annotation: AGE 41 Qualifier: Diagnosis of  By: Lorrene Reid LPN, Wanda     Complication of anesthesia    vomited during last c/s   CPD (cephalo-pelvic disproportion) 12/22/2014   Cystic fibrosis carrier in second trimester, antepartum 05/29/2014   Depression    Eczema    GERD (gastroesophageal reflux disease)    chronic   Headache    h/o migraines   Hypothyroidism    Nausea    Oligomenorrhea 12/22/2014   PCOS (polycystic ovarian syndrome)    PONV (postoperative nausea and vomiting)    Rhinitis, allergic     Past Surgical History:  Procedure Laterality Date   CESAREAN SECTION  04/19/2013   LTCS; CPD   CESAREAN SECTION N/A 10/27/2015   Procedure: REPEAT CESAREAN SECTION;  Surgeon: Herold Harms, MD;  Location: [facility];  Service: Obstetrics;  Laterality: N/A;   ENDOSCOPIC TURBINATE REDUCTION Bilateral 07/26/2017   Procedure: ENDOSCOPIC INFERIOR  TURBINATE REDUCTION;  Surgeon: Vernie Murders, MD;  Location: [facility];  Service: ENT;  Laterality: Bilateral;   FRONTAL SINUS EXPLORATION Bilateral 07/26/2017   Procedure: FRONTAL SINUS EXPLORATION;  Surgeon: Vernie Murders, MD;  Location: [facility];  Service: ENT;  Laterality: Bilateral;   IMAGE GUIDED SINUS SURGERY Bilateral 07/26/2017   Procedure: IMAGE GUIDED SINUS SURGERY;  Surgeon: Vernie Murders, MD;  Location: [facility];  Service: ENT;  Laterality: Bilateral;  GAVE DISK TO CECE 2-12   SEPTOPLASTY WITH ETHMOIDECTOMY, AND MAXILLARY ANTROSTOMY Bilateral 07/26/2017    Procedure: SEPTOPLASTY WITH TOTAL  ETHMOIDECTOMY, AND MAXILLARY ANTROSTOMYWITH REMOVAL OF TISSUE,;  Surgeon: Vernie Murders, MD;  Location: [facility];  Service: ENT;  Laterality: Bilateral;   TONSILLECTOMY     WISDOM TOOTH EXTRACTION      Family Psychiatric History: Please see initial evaluation for full details. I have reviewed the history. No updates at this time.     Family History:  Family History  Problem Relation Age of Onset   Endometriosis Mother    Bipolar disorder Mother    Alcohol abuse Mother    Heart disease Maternal Grandmother        valvular problem   Depression Maternal Grandmother    Breast cancer Neg Hx    Ovarian cancer Neg Hx    Colon cancer Neg Hx     Social History:  Social History   Socioeconomic History   Marital status: Married    Spouse name: Not on file   Number of children: Not on file   Years of education: Not on file   Highest education level: Not on file  Occupational History   Occupation: ED tech    Employer: [employer]  Tobacco Use   Smoking status: Never   Smokeless tobacco: Never  Vaping Use   Vaping Use: Never used  Substance and Sexual Activity   Alcohol use: Yes    Alcohol/week: 2.0 standard drinks of alcohol    Types: 2 Standard drinks or equivalent per week    Comment: Social  Drug use: No   Sexual activity: Yes    Partners: Male    Birth control/protection: Spermicide, Coitus interruptus  Other Topics Concern   Not on file  Social History Narrative   Not on file   Social Determinants of Health   Financial Resource Strain: Not on file  Food Insecurity: Not on file  Transportation Needs: Not on file  Physical Activity: Not on file  Stress: Not on file  Social Connections: Not on file    Allergies:  Allergies  Allergen Reactions   Iodine Anaphylaxis    Contrast    Shellfish Allergy Hives   Contrast Media [Iodinated Contrast Media]    Flagyl [Metronidazole]     Metabolic Disorder Labs: Lab Results   Component Value Date   HGBA1C 5.2 08/10/2021   Lab Results  Component Value Date   PROLACTIN 35.8 (H) 03/14/2016   PROLACTIN 43.4 12/27/2007   Lab Results  Component Value Date   CHOL 293 (H) 08/10/2021   TRIG 92 08/10/2021   HDL 64 08/10/2021   CHOLHDL 4.6 (H) 08/10/2021   VLDL 17 09/25/2011   LDLCALC 214 (H) 08/10/2021   LDLCALC 178 (H) 03/26/2019   Lab Results  Component Value Date   TSH 2.630 08/10/2021   TSH 2.070 10/06/2020    Therapeutic Level Labs: No results found for: "LITHIUM" No results found for: "VALPROATE" No results found for: "CBMZ"  Current Medications: Current Outpatient Medications  Medication Sig Dispense Refill   albuterol (PROVENTIL HFA;VENTOLIN HFA) 108 (90 Base) MCG/ACT inhaler Inhale 2 puffs into the lungs every 6 (six) hours as needed for wheezing or shortness of breath.     ARAZLO 0.045 % LOTN Apply topically.     azelastine (OPTIVAR) 0.05 % ophthalmic solution 1 drop into affected eye Ophthalmic Twice a day 30 days 6 mL 3   cephALEXin (KEFLEX) 500 MG capsule Take 1 capsule (500 mg total) by mouth 2 (two) times daily. 14 capsule 0   Drospirenone (SLYND) 4 MG TABS Take 1 tablet by mouth daily. 28 tablet 11   EPINEPHrine 0.3 mg/0.3 mL IJ SOAJ injection as directed Injection prn 30 days 2 each 1   EPSOLAY 5 % cream Apply 1 Application topically every morning.     eszopiclone (LUNESTA) 1 MG TABS tablet Take 1 tablet (1 mg total) by mouth at bedtime as needed for sleep. Take immediately before bedtime 30 tablet 2   fluticasone (FLONASE) 50 MCG/ACT nasal spray 1 spray in each nostril Nasally Once a day 30 day(s) 16 g 3   levocetirizine (XYZAL) 5 MG tablet 1 tablet in the evening Orally Once a day 30 day(s) 30 tablet 3   levothyroxine (SYNTHROID) 75 MCG tablet TAKE 1 TABLET BY MOUTH DAILY BEFORE BREAKFAST 30 tablet 11   mometasone (ELOCON) 0.1 % cream 1 application Externally Twice a day 30 days 90 g 1   montelukast (SINGULAIR) 10 MG tablet Take 10  mg by mouth as needed.     omeprazole (PRILOSEC) 40 MG capsule Take 1 capsule (40 mg total) by mouth 2 (two) times daily. 180 capsule 3   omeprazole (PRILOSEC) 40 MG capsule Take 1 capsule (40 mg total) by mouth 2 (two) times daily. 180 capsule 3   phentermine (ADIPEX-P) 37.5 MG tablet Take 1 tablet (37.5 mg total) by mouth daily before breakfast. 30 tablet 0   spironolactone (ALDACTONE) 100 MG tablet TAKE 2 TABLETS BY MOUTH DAILY 180 tablet 3   venlafaxine XR (EFFEXOR-XR) 75 MG 24 hr  capsule Take 3 capsules (225 mg total) by mouth daily with breakfast. 90 capsule 2   venlafaxine XR (EFFEXOR-XR) 75 MG 24 hr capsule Take 3 capsules (225 mg total) by mouth daily with breakfast. 90 capsule 2   Current Facility-Administered Medications  Medication Dose Route Frequency Provider Last Rate Last Admin   cyanocobalamin ((VITAMIN B-12)) injection 1,000 mcg  1,000 mcg Intramuscular Once Philip Aspen, CNM         Musculoskeletal: Strength & Muscle Tone:  N/A Gait & Station:  N/A Patient leans: N/A  Psychiatric Specialty Exam: Review of Systems  There were no vitals taken for this visit.There is no height or weight on file to calculate BMI.  General Appearance: {Appearance:22683}  Eye Contact:  {BHH EYE CONTACT:22684}  Speech:  Clear and Coherent  Volume:  Normal  Mood:  {BHH MOOD:22306}  Affect:  {Affect (PAA):22687}  Thought Process:  Coherent  Orientation:  {BHH ORIENTATION (PAA):22689}  Thought Content: {Thought Content:22690}   Suicidal Thoughts:  {ST/HT (PAA):22692}  Homicidal Thoughts:  {ST/HT (PAA):22692}  Memory:  {BHH MEMORY:22881}  Judgement:  {Judgement (PAA):22694}  Insight:  {Insight (PAA):22695}  Psychomotor Activity:  {Psychomotor (PAA):22696}  Concentration:  {Concentration:21399}  Recall:  {BHH GOOD/FAIR/POOR:22877}  Fund of Knowledge: {BHH GOOD/FAIR/POOR:22877}  Language: {BHH GOOD/FAIR/POOR:22877}  Akathisia:  {BHH YES OR NO:22294}  Handed:  {Handed:22697}  AIMS  (if indicated): {Desc; done/not:10129}  Assets:  {Assets (PAA):22698}  ADL's:  {BHH XO:4411959  Cognition: {chl bhh cognition:304700322}  Sleep:  {BHH GOOD/FAIR/POOR:22877}   Screenings: GAD-7    Flowsheet Row Office Visit from 06/16/2020 in [facility] Visit from 04/28/2020 in [facility]  Total GAD-7 Score 7 8      PHQ2-9    [place] Office Visit from 10/20/2021 in [place] Video Visit from 07/27/2021 in [place] Video Visit from 03/30/2021 in [place] Video Visit from 01/26/2021 in [place] Video Visit from 11/10/2020 in [place]  PHQ-2 Total Score 0 1 0 0 0  PHQ-9 Total Score 1 -- -- -- --      Flowsheet Row Video Visit from 03/30/2021 in [place] Video Visit from 11/10/2020 in [place] Video Visit from 10/06/2020 in [place] No Risk No Risk No Risk        Assessment and Plan:  Paula [name] is a 41 y.o. year old female with a history of PTSD, depression, anxiety, GERD, hypothyroidism, PCOS, who presents for follow up appointment for below.     1. MDD (major depressive disorder), recurrent episode, mild ([place]) 2. PTSD (post-traumatic stress disorder) 3. GAD (generalized anxiety disorder) There has been significant worsening in anxiety and depressive symptoms in the context of her mother overdosing, and self tapering venlafaxine. Psychosocial stressors includes marital conflict, stress at work, and childhood trauma.  After having discussion a month, she is willing to retry higher dose of venlafaxine given she denies any adverse reaction except that she did not feel like herself, which may be more attributable to situational stress.  She was advised to continue couple therapy.     # Insomnia Stable.  She reports great benefit from [place].  Will continue current dose to target insomnia.    Plan Increase venlafaxine 225 mg daily Continue Lunesta 1 mg at night as needed for insomnia Next appointment:  11/15 at 10:30  for 30 mins, video - on phentermine  Past trials of medication: sertraline, fluoxetine (nightmares), L-theanine , Ambien, Trazodone   The patient demonstrates the following risk factors for suicide: Chronic risk factors for suicide include: psychiatric disorder of depression, anxiety. Acute risk factors for suicide include: N/A. Protective factors for this patient include: positive social support, coping skills and hope for the future. Considering these factors, the overall suicide risk at this point appears to be low. Patient is appropriate for outpatient follow up.    Collaboration of Care: Collaboration of Care: {BH OP Collaboration of Care:21014065}  Patient/Guardian was advised Release of Information must be obtained prior to any record release in order to collaborate their care with an outside provider. Patient/Guardian was advised if they have not already done so to contact the registration department to sign all necessary forms in order for [place] to release information regarding their care.   Consent: Patient/Guardian gives verbal consent for treatment and assignment of benefits for services provided during this visit. Patient/Guardian expressed understanding and agreed to proceed.    [name], MD 04/06/2022, 12:00 PM

## 2022-04-09 ENCOUNTER — Other Ambulatory Visit: Payer: Self-pay

## 2022-04-09 MED ORDER — CYCLOBENZAPRINE HCL 5 MG PO TABS
5.0000 mg | ORAL_TABLET | Freq: Three times a day (TID) | ORAL | 0 refills | Status: DC | PRN
  Filled 2022-04-09: qty 15, 5d supply, fill #0

## 2022-04-09 MED ORDER — METHYLPREDNISOLONE 4 MG PO TBPK
ORAL_TABLET | ORAL | 0 refills | Status: DC
  Filled 2022-04-09: qty 21, 6d supply, fill #0

## 2022-04-10 ENCOUNTER — Other Ambulatory Visit: Payer: Self-pay

## 2022-04-10 MED ORDER — NORTRIPTYLINE HCL 10 MG PO CAPS
10.0000 mg | ORAL_CAPSULE | Freq: Every evening | ORAL | 11 refills | Status: DC
  Filled 2022-04-10: qty 60, 30d supply, fill #0
  Filled 2022-05-12: qty 60, 30d supply, fill #1
  Filled 2022-06-17: qty 60, 30d supply, fill #2
  Filled 2022-07-24: qty 60, 30d supply, fill #3

## 2022-04-10 MED ORDER — TRAZODONE HCL 50 MG PO TABS
ORAL_TABLET | ORAL | 11 refills | Status: DC
  Filled 2022-04-10: qty 30, 60d supply, fill #0
  Filled 2022-06-17 – 2022-06-20 (×2): qty 30, 60d supply, fill #1
  Filled 2022-08-25: qty 30, 60d supply, fill #2
  Filled 2022-11-08: qty 30, 60d supply, fill #3
  Filled 2023-01-02: qty 30, 60d supply, fill #4

## 2022-04-12 ENCOUNTER — Telehealth: Payer: Self-pay | Admitting: Psychiatry

## 2022-04-12 ENCOUNTER — Telehealth: Payer: No Typology Code available for payment source | Admitting: Psychiatry

## 2022-04-12 NOTE — Telephone Encounter (Signed)
Sent link for video visit through Epic. Patient did not sign in. Called the patient for appointment scheduled today. She states that she called today to cancel the appointment as she has been sick. She agrees to reschedule the appointment. 12/6 at 4:30.

## 2022-04-13 ENCOUNTER — Other Ambulatory Visit: Payer: Self-pay

## 2022-04-13 MED ORDER — LEVOCETIRIZINE DIHYDROCHLORIDE 5 MG PO TABS
5.0000 mg | ORAL_TABLET | Freq: Every day | ORAL | 6 refills | Status: DC
  Filled 2022-04-13: qty 30, 30d supply, fill #0
  Filled 2022-05-12: qty 30, 30d supply, fill #1
  Filled 2022-06-17 – 2022-06-20 (×2): qty 30, 30d supply, fill #2
  Filled 2022-07-24 – 2022-08-25 (×2): qty 30, 30d supply, fill #3
  Filled 2022-09-22: qty 30, 30d supply, fill #4
  Filled 2022-11-08: qty 30, 30d supply, fill #5
  Filled 2022-12-25: qty 30, 30d supply, fill #6

## 2022-05-02 NOTE — Progress Notes (Unsigned)
Virtual Visit via Video Note  I connected with Paula [name] on 05/03/22 at  4:30 PM EST by a video enabled telemedicine application and verified that I am speaking with the correct person using two identifiers.  Location: Patient: home Provider: office Persons participated in the visit- patient, provider    I discussed the limitations of evaluation and management by telemedicine and the availability of in person appointments. The patient expressed understanding and agreed to proceed.   I discussed the assessment and treatment plan with the patient. The patient was provided an opportunity to ask questions and all were answered. The patient agreed with the plan and demonstrated an understanding of the instructions.   The patient was advised to call back or seek an in-person evaluation if the symptoms worsen or if the condition fails to improve as anticipated.  I provided 15 minutes of non-face-to-face time during this encounter.   Neysa Hottereina Deolinda Frid, MD    [facility] MD/PA/NP OP Progress Note  05/03/2022 5:02 PM Paula [name]  MRN:  [id number]  Chief Complaint:  Chief Complaint  Patient presents with   Depression   Follow-up   HPI:  This is a follow-up appointment for depression, and insomnia.  She states that she has been doing better since being on the higher dose of venlafaxine.  She will also cut her hours at work so that she can get more involved in school.  She reports fair relationship with her children.  Her youngest daughter has been able to recognize only emotion, and it has helped for better communication.  She states that her husband is wondering whether she has ADHD.  She never finishes projects, and gets distracted easily.  She needs to read a few times, and cannot stop her brain.  Although she has had good scores at school, she needs to work very hard for this.  She denies feeling depressed.  She feels less anxious.  She sleeps better since but she has been back on the  lower dose of trazodone, prescribed by her neurologist.  She denies change in appetite.  She denies SI. She states that she has been seen by neurologist for headache/neuropathic pain.  It has been getting better since being on nortriptyline.   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 No special class She was born in [place]. Her parents were never married. She had estranged relationship with her biological father since she was a child.  Visit Diagnosis:    ICD-10-CM   1. MDD (major depressive disorder), recurrent, in partial remission (HCC)  F33.41     2. PTSD (post-traumatic stress disorder)  F43.10     3. GAD (generalized anxiety disorder)  F41.1       Past Psychiatric History: Please see initial evaluation for full details. I have reviewed the history. No updates at this time.     Past Medical History:  Past Medical History:  Diagnosis Date   Anemia    Asthma    exercise induced   BULIMIA 01/17/2008   Annotation: AGE 25 Qualifier: Diagnosis of  By: Lorrene Reidombs LPN, Wanda     Complication of anesthesia    vomited during last c/s   CPD (cephalo-pelvic disproportion) 12/22/2014   Cystic fibrosis carrier in second trimester, antepartum 05/29/2014  Depression    Eczema    GERD (gastroesophageal reflux disease)    chronic   Headache    h/o migraines   Hypothyroidism    Nausea    Oligomenorrhea 12/22/2014   PCOS (polycystic ovarian syndrome)    PONV (postoperative nausea and vomiting)    Rhinitis, allergic     Past Surgical History:  Procedure Laterality Date   CESAREAN SECTION  04/19/2013   LTCS; CPD   CESAREAN SECTION N/A 10/27/2015   Procedure: REPEAT CESAREAN SECTION;  Surgeon: Herold Harms, MD;  Location: [facility];   Service: Obstetrics;  Laterality: N/A;   ENDOSCOPIC TURBINATE REDUCTION Bilateral 07/26/2017   Procedure: ENDOSCOPIC INFERIOR  TURBINATE REDUCTION;  Surgeon: Vernie Murders, MD;  Location: [facility];  Service: ENT;  Laterality: Bilateral;   FRONTAL SINUS EXPLORATION Bilateral 07/26/2017   Procedure: FRONTAL SINUS EXPLORATION;  Surgeon: Vernie Murders, MD;  Location: [facility];  Service: ENT;  Laterality: Bilateral;   IMAGE GUIDED SINUS SURGERY Bilateral 07/26/2017   Procedure: IMAGE GUIDED SINUS SURGERY;  Surgeon: Vernie Murders, MD;  Location: [facility];  Service: ENT;  Laterality: Bilateral;  GAVE DISK TO CECE 2-12   SEPTOPLASTY WITH ETHMOIDECTOMY, AND MAXILLARY ANTROSTOMY Bilateral 07/26/2017   Procedure: SEPTOPLASTY WITH TOTAL  ETHMOIDECTOMY, AND MAXILLARY ANTROSTOMYWITH REMOVAL OF TISSUE,;  Surgeon: Vernie Murders, MD;  Location: [facility];  Service: ENT;  Laterality: Bilateral;   TONSILLECTOMY     WISDOM TOOTH EXTRACTION      Family Psychiatric History: Please see initial evaluation for full details. I have reviewed the history. No updates at this time.     Family History:  Family History  Problem Relation Age of Onset   Endometriosis Mother    Bipolar disorder Mother    Alcohol abuse Mother    Heart disease Maternal Grandmother        valvular problem   Depression Maternal Grandmother    Breast cancer Neg Hx    Ovarian cancer Neg Hx    Colon cancer Neg Hx     Social History:  Social History   Socioeconomic History   Marital status: Married    Spouse name: Not on file   Number of children: Not on file   Years of education: Not on file   Highest education level: Not on file  Occupational History   Occupation: ED tech    Employer: [employer]  Tobacco Use   Smoking status: Never   Smokeless tobacco: Never  Vaping Use   Vaping Use: Never used  Substance and Sexual Activity   Alcohol use: Yes    Alcohol/week: 2.0 standard drinks of alcohol     Types: 2 Standard drinks or equivalent per week    Comment: Social   Drug use: No   Sexual activity: Yes    Partners: Male    Birth control/protection: Spermicide, Coitus interruptus  Other Topics Concern   Not on file  Social History Narrative   Not on file   Social Determinants of Health   Financial Resource Strain: Not on file  Food Insecurity: Not on file  Transportation Needs: Not on file  Physical Activity: Not on file  Stress: Not on file  Social Connections: Not on file    Allergies:  Allergies  Allergen Reactions   Iodine Anaphylaxis    Contrast    Shellfish Allergy Hives   Contrast Media [Iodinated Contrast Media]    Flagyl [Metronidazole]     Metabolic Disorder Labs: Lab Results  Component Value Date   HGBA1C 5.2 08/10/2021   Lab Results  Component Value Date   PROLACTIN 35.8 (H) 03/14/2016   PROLACTIN 43.4 12/27/2007   Lab Results  Component Value Date   CHOL 293 (H) 08/10/2021   TRIG 92 08/10/2021   HDL 64 08/10/2021   CHOLHDL 4.6 (H) 08/10/2021   VLDL 17 09/25/2011   LDLCALC 214 (H) 08/10/2021   LDLCALC 178 (H) 03/26/2019   Lab Results  Component Value Date   TSH 2.630 08/10/2021   TSH 2.070 10/06/2020    Therapeutic Level Labs: No results found for: "LITHIUM" No results found for: "VALPROATE" No results found for: "CBMZ"  Current Medications: Current Outpatient Medications  Medication Sig Dispense Refill   albuterol (PROVENTIL HFA;VENTOLIN HFA) 108 (90 Base) MCG/ACT inhaler Inhale 2 puffs into the lungs every 6 (six) hours as needed for wheezing or shortness of breath.     ARAZLO 0.045 % LOTN Apply topically.     azelastine (OPTIVAR) 0.05 % ophthalmic solution 1 drop into affected eye Ophthalmic Twice a day 30 days 6 mL 3   cephALEXin (KEFLEX) 500 MG capsule Take 1 capsule (500 mg total) by mouth 2 (two) times daily. 14 capsule 0   Drospirenone (SLYND) 4 MG TABS Take 1 tablet by mouth daily. 28 tablet 11   EPINEPHrine 0.3  mg/0.3 mL IJ SOAJ injection as directed Injection prn 30 days 2 each 1   EPSOLAY 5 % cream Apply 1 Application topically every morning.     fluticasone (FLONASE) 50 MCG/ACT nasal spray 1 spray in each nostril Nasally Once a day 30 day(s) 16 g 3   levocetirizine (XYZAL) 5 MG tablet Take 1 tablet (5 mg total) by mouth daily. 30 tablet 6   levothyroxine (SYNTHROID) 75 MCG tablet TAKE 1 TABLET BY MOUTH DAILY BEFORE BREAKFAST 30 tablet 11   mometasone (ELOCON) 0.1 % cream 1 application Externally Twice a day 30 days 90 g 1   montelukast (SINGULAIR) 10 MG tablet Take 10 mg by mouth as needed.     nortriptyline (PAMELOR) 10 MG capsule Take 1 capsule (10 mg total) by mouth at bedtime for 1 week, then increase to 2 capsules (20mg ) at night thereafter 60 capsule 11   omeprazole (PRILOSEC) 40 MG capsule Take 1 capsule (40 mg total) by mouth 2 (two) times daily. 180 capsule 3   omeprazole (PRILOSEC) 40 MG capsule Take 1 capsule (40 mg total) by mouth 2 (two) times daily. 180 capsule 3   phentermine (ADIPEX-P) 37.5 MG tablet Take 1 tablet (37.5 mg total) by mouth daily before breakfast. 30 tablet 0   spironolactone (ALDACTONE) 100 MG tablet TAKE 2 TABLETS BY MOUTH DAILY 180 tablet 3   traZODone (DESYREL) 50 MG tablet Take 1/4 to 1/2 tablet (12.5 - 25 mg) by mouth at night for sleep. 30 tablet 11   venlafaxine XR (EFFEXOR-XR) 75 MG 24 hr capsule Take 3 capsules (225 mg total) by mouth daily with breakfast. 90 capsule 2   venlafaxine XR (EFFEXOR-XR) 75 MG 24 hr capsule Take 3 capsules (225 mg total) by mouth daily with breakfast. 90 capsule 2   Current Facility-Administered Medications  Medication Dose Route Frequency Provider Last Rate Last Admin   cyanocobalamin ((VITAMIN B-12)) injection 1,000 mcg  1,000 mcg Intramuscular Once , CNM         Musculoskeletal: Strength & Muscle Tone:  N/A Gait & Station:  N/A Patient leans: N/A  Psychiatric Specialty Exam: Review of Systems  Psychiatric/Behavioral:  Positive for sleep disturbance. Negative for agitation, behavioral problems, confusion, decreased concentration, dysphoric mood, hallucinations, self-injury and suicidal ideas. The patient is not nervous/anxious and is not hyperactive.   All other systems reviewed and are negative.   There were no vitals taken for this visit.There is no height or weight on file to calculate BMI.  General Appearance: Fairly Groomed  Eye Contact:  Good  Speech:  Clear and Coherent  Volume:  Normal  Mood:   better  Affect:  Appropriate, Congruent, and Full Range  Thought Process:  Coherent  Orientation:  Full (Time, Place, and Person)  Thought Content: Logical   Suicidal Thoughts:  No  Homicidal Thoughts:  No  Memory:  Immediate;   Good  Judgement:  Good  Insight:  Good  Psychomotor Activity:  Normal  Concentration:  Concentration: Good and Attention Span: Good  Recall:  Good  Fund of Knowledge: Good  Language: Good  Akathisia:  No  Handed:  Right  AIMS (if indicated): not done  Assets:  Communication Skills Desire for Improvement  ADL's:  Intact  Cognition: WNL  Sleep:  Fair   Screenings: GAD-7    Flowsheet Row Office Visit from 06/16/2020 in [facility] Office Visit from 04/28/2020 in [facility]  Total GAD-7 Score 7 8      PHQ2-9    Flowsheet Row Office Visit from 10/20/2021 in [facility] Video Visit from 07/27/2021 in [facility] Video Visit from 03/30/2021 in [facility] Video Visit from 01/26/2021 in [facility] Video Visit from 11/10/2020 in [facility]  PHQ-2 Total Score 0 1 0 0 0  PHQ-9 Total Score 1 -- -- -- --      Flowsheet Row Video Visit from 03/30/2021 in [facility] Video Visit from 11/10/2020 in [facility] Video Visit from 10/06/2020  in [facility]  C-SSRS RISK CATEGORY No Risk No Risk No Risk        Assessment and Plan:  Paula [name] is a 41 y.o. year old female with a history of PTSD, depression, anxiety, GERD, hypothyroidism, PCOS, who presents for follow up appointment for below.   1. MDD (major depressive disorder), recurrent, in partial remission (HCC) 2. PTSD (post-traumatic stress disorder) 3. GAD (generalized anxiety disorder) There has been improvement in depressive symptoms and anxiety since uptitration of venlafaxine.  Psychosocial stressors includes recent overdosing of her mother, marital conflict, stress at work, and childhood trauma.  Will continue current dose of venlafaxine to target depression, PTSD and anxiety.  Noted that she has been started on nortriptyline by her neurologist for headache.  Although there would not be evidence-based additional benefit for her mood symptoms, will stay at this time given she reports benefit for neuropathic pain.  Discussed potential risk of serotonin syndrome.   # Insomnia She reports benefit from low dose of trazodone instead of Lunesta.  Will continue current dose to target insomnia.  Discussed potential risk of drowsiness.   # Inattention She has symptoms of inattention.  She has never had formal evaluation.  Etiology is likely multifactorial given her mood symptoms, insomnia, and pain.  She is not interested in neuropsychological evaluation for ADHD nor pharmacological treatment at this time.    Plan Increase venlafaxine 225 mg daily Continue Trazodone 12.5 mg at night as needed for insomnia Hold Lunesta Next appointment:  2/7 at 2 PM for 30 mins, video -  on nortriptyline 20 mg at night, prescribed by neurologist - on phentermine   Past trials of medication: sertraline, fluoxetine (nightmares), L-theanine , Ambien, Trazodone   The patient demonstrates the following risk factors for suicide: Chronic risk factors for suicide  include: psychiatric disorder of depression, anxiety. Acute risk factors for suicide include: N/A. Protective factors for this patient include: positive social support, coping skills and hope for the future. Considering these factors, the overall suicide risk at this point appears to be low. Patient is appropriate for outpatient follow up.          Collaboration of Care: Collaboration of Care: Other reviewed notes in Epic  Patient/Guardian was advised Release of Information must be obtained prior to any record release in order to collaborate their care with an outside provider. Patient/Guardian was advised if they have not already done so to contact the registration department to sign all necessary forms in order for [place] to release information regarding their care.   Consent: Patient/Guardian gives verbal consent for treatment and assignment of benefits for services provided during this visit. Patient/Guardian expressed understanding and agreed to proceed.    Neysa Hotter, MD 05/03/2022, 5:02 PM

## 2022-05-03 ENCOUNTER — Telehealth (INDEPENDENT_AMBULATORY_CARE_PROVIDER_SITE_OTHER): Payer: No Typology Code available for payment source | Admitting: Psychiatry

## 2022-05-03 ENCOUNTER — Encounter: Payer: Self-pay | Admitting: Psychiatry

## 2022-05-03 DIAGNOSIS — F3341 Major depressive disorder, recurrent, in partial remission: Secondary | ICD-10-CM

## 2022-05-03 DIAGNOSIS — F4310 Post-traumatic stress disorder, unspecified: Secondary | ICD-10-CM | POA: Diagnosis not present

## 2022-05-03 DIAGNOSIS — F411 Generalized anxiety disorder: Secondary | ICD-10-CM

## 2022-05-03 NOTE — Patient Instructions (Signed)
Increase venlafaxine 225 mg daily Continue Trazodone 12.5 mg at night as needed for insomnia Hold Lunesta Next appointment:  2/7 at 2 PM

## 2022-05-12 ENCOUNTER — Other Ambulatory Visit: Payer: Self-pay | Admitting: Certified Nurse Midwife

## 2022-05-12 ENCOUNTER — Other Ambulatory Visit: Payer: Self-pay

## 2022-05-14 ENCOUNTER — Other Ambulatory Visit: Payer: Self-pay

## 2022-05-15 ENCOUNTER — Other Ambulatory Visit: Payer: Self-pay

## 2022-05-15 MED FILL — Levothyroxine Sodium Tab 75 MCG: ORAL | 30 days supply | Qty: 30 | Fill #0 | Status: CN

## 2022-05-18 ENCOUNTER — Other Ambulatory Visit: Payer: Self-pay

## 2022-05-28 ENCOUNTER — Other Ambulatory Visit: Payer: Self-pay

## 2022-06-02 ENCOUNTER — Telehealth: Payer: Self-pay | Admitting: Certified Nurse Midwife

## 2022-06-02 NOTE — Telephone Encounter (Signed)
I contacted patient about adjustment to AT scheduled for 1/15 from 3:30 pm to 3:15. I advise patient via voicemail to call back to confirm or rescheduled if that time doesn't work for her.

## 2022-06-12 ENCOUNTER — Encounter: Payer: Self-pay | Admitting: Certified Nurse Midwife

## 2022-06-12 ENCOUNTER — Ambulatory Visit (INDEPENDENT_AMBULATORY_CARE_PROVIDER_SITE_OTHER): Payer: Commercial Managed Care - PPO | Admitting: Certified Nurse Midwife

## 2022-06-12 VITALS — BP 130/92 | HR 84 | Ht 59.0 in | Wt 149.0 lb

## 2022-06-12 DIAGNOSIS — Z79899 Other long term (current) drug therapy: Secondary | ICD-10-CM

## 2022-06-12 NOTE — Progress Notes (Signed)
  Pt came in with hopes of restarting on phentermine. Discussed elevated BP , also discussed recomeended waiting 1 yr prior to restarting medication. Pt encourage continue her efforts for wt loss.   No change visit today .   She will return in March for her annual exam.   Philip Aspen, CNM

## 2022-06-17 ENCOUNTER — Other Ambulatory Visit: Payer: Self-pay

## 2022-06-17 MED FILL — Levothyroxine Sodium Tab 75 MCG: ORAL | 30 days supply | Qty: 30 | Fill #0 | Status: CN

## 2022-06-19 ENCOUNTER — Other Ambulatory Visit: Payer: Self-pay

## 2022-06-19 MED ORDER — FLUTICASONE PROPIONATE 50 MCG/ACT NA SUSP
NASAL | 3 refills | Status: AC
  Filled 2022-06-19: qty 16, 30d supply, fill #0
  Filled 2022-07-24 – 2022-09-22 (×2): qty 16, 30d supply, fill #1
  Filled 2022-11-23: qty 16, 30d supply, fill #2

## 2022-06-20 ENCOUNTER — Other Ambulatory Visit: Payer: Self-pay

## 2022-06-20 MED ORDER — SLYND 4 MG PO TABS
1.0000 | ORAL_TABLET | Freq: Every day | ORAL | 2 refills | Status: DC
  Filled 2022-06-20: qty 28, 28d supply, fill #0
  Filled 2022-07-24: qty 84, 84d supply, fill #0

## 2022-06-20 MED FILL — Levothyroxine Sodium Tab 75 MCG: ORAL | 30 days supply | Qty: 30 | Fill #0 | Status: AC

## 2022-07-04 NOTE — Progress Notes (Unsigned)
Virtual Visit via Video Note  I connected with Paula [name] on 07/05/22 at  2:00 PM EST by a video enabled telemedicine application and verified that I am speaking with the correct person using two identifiers.  Location: Patient: home Provider: office. Persons participated in the visit- patient, provider    I discussed the limitations of evaluation and management by telemedicine and the availability of in person appointments. The patient expressed understanding and agreed to proceed.    I discussed the assessment and treatment plan with the patient. The patient was provided an opportunity to ask questions and all were answered. The patient agreed with the plan and demonstrated an understanding of the instructions.   The patient was advised to call back or seek an in-person evaluation if the symptoms worsen or if the condition fails to improve as anticipated.  I provided 20 minutes of non-face-to-face time during this encounter.   Paula Clay, MD      [facility] MD/PA/NP OP Progress Note  07/05/2022 2:36 PM [name]  MRN:  [id number]  Chief Complaint:  Chief Complaint  Patient presents with   Follow-up   HPI:  This is a follow-up appointment for PTSD and depression.  She states that she cut work hours to 20 hours/week.  It has been helpful.  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  She enjoys this without feeling overwhelmed.  She talks about her daughter, who is putting bread in other crafts and water.  Her behaviors is one of the biggest issues.  She makes her so anxious.  She states that she is always a "bad cop" in the family.  She feels sad at times, wondering if she should have been careful of choosing her husband.  She has great support from church.  She has occasional nightmares and flashback about abandonment.  She denies SI.  She drinks a glass of wine occasionally.  She denies drug use.  She has not been able to have follow-up appointment with her  neurologist, but is planning to have one soon.  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 No special class She was born in [place]. Her parents were never married. She had estranged relationship with her biological father since she was a child.  Visit Diagnosis:    ICD-10-CM   1. PTSD (post-traumatic stress disorder)  F43.10     2. MDD (major depressive disorder), recurrent, in partial remission ([place])  F33.41     3. GAD (generalized anxiety disorder)  F41.1     4. Inattention  R41.840 Ambulatory referral to Psychology      Past Psychiatric History: Please see initial evaluation for full details. I have reviewed the history. No updates at this time.     Past Medical History:  Past Medical History:  Diagnosis Date   Anemia    Asthma    exercise induced   BULIMIA 01/17/2008   Annotation: AGE 3 Qualifier: Diagnosis of  By: Zara Council LPN, Wanda     Complication of anesthesia    vomited during last c/s   CPD (cephalo-pelvic disproportion) 12/22/2014   Cystic fibrosis carrier in second trimester, antepartum 05/29/2014   Depression    Eczema    GERD (gastroesophageal reflux disease)    chronic  Headache    h/o migraines   Hypothyroidism    Nausea    Oligomenorrhea 12/22/2014   PCOS (polycystic ovarian syndrome)    PONV (postoperative nausea and vomiting)    Rhinitis, allergic     Past Surgical History:  Procedure Laterality Date   CESAREAN SECTION  04/19/2013   LTCS; CPD   CESAREAN SECTION N/A 10/27/2015   Procedure: REPEAT CESAREAN SECTION;  Surgeon: Herold Harms, MD;  Location: [facility];  Service: Obstetrics;  Laterality: N/A;   ENDOSCOPIC TURBINATE REDUCTION Bilateral 07/26/2017   Procedure: ENDOSCOPIC INFERIOR  TURBINATE  REDUCTION;  Surgeon: Vernie Murders, MD;  Location: [facility];  Service: ENT;  Laterality: Bilateral;   FRONTAL SINUS EXPLORATION Bilateral 07/26/2017   Procedure: FRONTAL SINUS EXPLORATION;  Surgeon: Vernie Murders, MD;  Location: [facility];  Service: ENT;  Laterality: Bilateral;   IMAGE GUIDED SINUS SURGERY Bilateral 07/26/2017   Procedure: IMAGE GUIDED SINUS SURGERY;  Surgeon: Vernie Murders, MD;  Location: [facility];  Service: ENT;  Laterality: Bilateral;  GAVE DISK TO CECE 2-12   SEPTOPLASTY WITH ETHMOIDECTOMY, AND MAXILLARY ANTROSTOMY Bilateral 07/26/2017   Procedure: SEPTOPLASTY WITH TOTAL  ETHMOIDECTOMY, AND MAXILLARY ANTROSTOMYWITH REMOVAL OF TISSUE,;  Surgeon: Vernie Murders, MD;  Location: [facility];  Service: ENT;  Laterality: Bilateral;   TONSILLECTOMY     WISDOM TOOTH EXTRACTION      Family Psychiatric History: Please see initial evaluation for full details. I have reviewed the history. No updates at this time.     Family History:  Family History  Problem Relation Age of Onset   Endometriosis Mother    Bipolar disorder Mother    Alcohol abuse Mother    Heart disease Maternal Grandmother        valvular problem   Depression Maternal Grandmother    Breast cancer Neg Hx    Ovarian cancer Neg Hx    Colon cancer Neg Hx     Social History:  Social History   Socioeconomic History   Marital status: Married    Spouse name: Not on file   Number of children: Not on file   Years of education: Not on file   Highest education level: Not on file  Occupational History   Occupation: ED tech    Employer: [employer]  Tobacco Use   Smoking status: Never   Smokeless tobacco: Never  Vaping Use   Vaping Use: Never used  Substance and Sexual Activity   Alcohol use: Yes    Alcohol/week: 2.0 standard drinks of alcohol    Types: 2 Standard drinks or equivalent per week    Comment: Social   Drug use: No   Sexual activity: Yes    Partners: Male     Birth control/protection: Spermicide, Coitus interruptus  Other Topics Concern   Not on file  Social History Narrative   Not on file   Social Determinants of Health   Financial Resource Strain: Not on file  Food Insecurity: Not on file  Transportation Needs: Not on file  Physical Activity: Not on file  Stress: Not on file  Social Connections: Not on file    Allergies:  Allergies  Allergen Reactions   Iodine Anaphylaxis    Contrast    Shellfish Allergy Hives   Contrast Media [Iodinated Contrast Media]    Flagyl [Metronidazole]     Metabolic Disorder Labs: Lab Results  Component Value Date   HGBA1C 5.2 08/10/2021   Lab Results  Component Value Date  PROLACTIN 35.8 (H) 03/14/2016   PROLACTIN 43.4 12/27/2007   Lab Results  Component Value Date   CHOL 293 (H) 08/10/2021   TRIG 92 08/10/2021   HDL 64 08/10/2021   CHOLHDL 4.6 (H) 08/10/2021   VLDL 17 09/25/2011   LDLCALC 214 (H) 08/10/2021   LDLCALC 178 (H) 03/26/2019   Lab Results  Component Value Date   TSH 2.630 08/10/2021   TSH 2.070 10/06/2020    Therapeutic Level Labs: No results found for: "LITHIUM" No results found for: "VALPROATE" No results found for: "CBMZ"  Current Medications: Current Outpatient Medications  Medication Sig Dispense Refill   albuterol (PROVENTIL HFA;VENTOLIN HFA) 108 (90 Base) MCG/ACT inhaler Inhale 2 puffs into the lungs every 6 (six) hours as needed for wheezing or shortness of breath.     ARAZLO 0.045 % LOTN Apply topically.     azelastine (OPTIVAR) 0.05 % ophthalmic solution 1 drop into affected eye Ophthalmic Twice a day 30 days 6 mL 3   Drospirenone (SLYND) 4 MG TABS Take 1 tablet (4 mg total) by mouth daily. 28 tablet 2   EPINEPHrine 0.3 mg/0.3 mL IJ SOAJ injection as directed Injection prn 30 days 2 each 1   EPSOLAY 5 % cream Apply 1 Application topically every morning.     fluticasone (FLONASE) 50 MCG/ACT nasal spray 1 spray each nostril Nasally Once a day 30 days 16 g  3   levocetirizine (XYZAL) 5 MG tablet Take 1 tablet (5 mg total) by mouth daily. 30 tablet 6   levothyroxine (SYNTHROID) 75 MCG tablet Take 1 tablet (75 mcg total) by mouth daily before breakfast. 30 tablet 11   mometasone (ELOCON) 0.1 % cream 1 application Externally Twice a day 30 days 90 g 1   montelukast (SINGULAIR) 10 MG tablet Take 10 mg by mouth as needed.     nortriptyline (PAMELOR) 10 MG capsule Take 1 capsule (10 mg total) by mouth at bedtime for 1 week, then increase to 2 capsules (20mg ) at night thereafter 60 capsule 11   phentermine (ADIPEX-P) 37.5 MG tablet Take 1 tablet (37.5 mg total) by mouth daily before breakfast. 30 tablet 0   spironolactone (ALDACTONE) 100 MG tablet TAKE 2 TABLETS BY MOUTH DAILY 180 tablet 3   traZODone (DESYREL) 50 MG tablet Take 1/4 to 1/2 tablet (12.5 - 25 mg) by mouth at night for sleep. 30 tablet 11   venlafaxine XR (EFFEXOR-XR) 75 MG 24 hr capsule Take 3 capsules (225 mg total) by mouth daily with breakfast. 90 capsule 2   [START ON 07/21/2022] venlafaxine XR (EFFEXOR-XR) 75 MG 24 hr capsule Take 3 capsules (225 mg total) by mouth daily with breakfast. 90 capsule 1   Current Facility-Administered Medications  Medication Dose Route Frequency Provider Last Rate Last Admin   cyanocobalamin ((VITAMIN B-12)) injection 1,000 mcg  1,000 mcg Intramuscular Once Philip Aspen, CNM         Musculoskeletal: Strength & Muscle Tone:  N/A Gait & Station:  N/A Patient leans: N/A  Psychiatric Specialty Exam: Review of Systems  Psychiatric/Behavioral:  Positive for decreased concentration, dysphoric mood and sleep disturbance. Negative for agitation, behavioral problems, confusion, hallucinations, self-injury and suicidal ideas. The patient is nervous/anxious. The patient is not hyperactive.   All other systems reviewed and are negative.   There were no vitals taken for this visit.There is no height or weight on file to calculate BMI.  General Appearance:  Fairly Groomed  Eye Contact:  Good  Speech:  Clear and Coherent  Volume:  Normal  Mood:  Anxious  Affect:  Appropriate and Congruent  Thought Process:  Coherent  Orientation:  Full (Time, Place, and Person)  Thought Content: Logical   Suicidal Thoughts:  No  Homicidal Thoughts:  No  Memory:  Immediate;   Good  Judgement:  Good  Insight:  Good  Psychomotor Activity:  Normal  Concentration:  Concentration: Good and Attention Span: Good  Recall:  Good  Fund of Knowledge: Good  Language: Good  Akathisia:  No  Handed:  Right  AIMS (if indicated): not done  Assets:  Communication Skills Desire for Improvement  ADL's:  Intact  Cognition: WNL  Sleep:  Poor   Screenings: GAD-7    Flowsheet Row Office Visit from 06/16/2020 in [facility] Visit from 04/28/2020 in [facility]  Total GAD-7 Score 7 8      PHQ2-9    [place] Office Visit from 10/20/2021 in [place] Video Visit from 07/27/2021 in [place] Video Visit from 03/30/2021 in [place] Video Visit from 01/26/2021 in [place] Video Visit from 11/10/2020 in [place]  PHQ-2 Total Score 0 1 0 0 0  PHQ-9 Total Score 1 -- -- -- --      Flowsheet Row Video Visit from 03/30/2021 in [place] Video Visit from 11/10/2020 in [place] Video Visit from 10/06/2020 in [place] No Risk No Risk No Risk        Assessment and Plan:  RENIA MIKELSON is a 42 y.o. year old female with a history of PTSD, depression, anxiety, GERD, hypothyroidism, PCOS, who presents for follow up appointment for below.   1. PTSD (post-traumatic stress disorder) 2. MDD  (major depressive disorder), recurrent, in partial remission ([place]) 3. GAD (generalized anxiety disorder) Acute stressors include: conflict with her daughter, occasional marital conflict, recent overdose of her mother  Other stressors include: childhood trauma,   History:   She continues to express anxiety in the context of stressors as above.  Noted that she has been on nortriptyline, prescribed by her neurologist was possible uptitration.  Will continue other medication as it is due to this potential medication change.  Will continue venlafaxine to target depression, PTSD and anxiety.  She was advised to consider family therapy if interested.   4. Inattention Overall improving.  Will continue current dose of trazodone as needed for insomnia.   # Inattention Unchanged. She has symptoms of inattention.  She has never had formal evaluation.  Etiology is likely multifactorial given her mood symptoms, insomnia, and pain.  She would like to pursue neuropsychological evaluation at this time.  Will make referral .   plan Continue venlafaxine 225 mg daily Continue Trazodone 12.5 mg at night as needed for insomnia Next appointment: 4/3 at 2 PM for 30 mins, video Referral for evaluation of ADHD - on nortriptyline 20 mg at night, prescribed by neurologist - on phentermine   Past trials of medication: sertraline, fluoxetine (nightmares), L-theanine , Ambien, Trazodone   The patient demonstrates the following risk factors for suicide: Chronic risk factors for suicide include: psychiatric disorder of depression, anxiety. Acute risk factors for suicide include: N/A. Protective factors for this patient include: positive social support, coping skills and hope for the future. Considering these factors, the overall  suicide risk at this point appears to be low. Patient is appropriate for outpatient follow up.      Collaboration of Care: Collaboration of Care: Other reviewed notes in Epic  Patient/Guardian  was advised Release of Information must be obtained prior to any record release in order to collaborate their care with an outside provider. Patient/Guardian was advised if they have not already done so to contact the registration department to sign all necessary forms in order for [place] to release information regarding their care.   Consent: Patient/Guardian gives verbal consent for treatment and assignment of benefits for services provided during this visit. Patient/Guardian expressed understanding and agreed to proceed.    Paula Clay, MD 07/05/2022, 2:36 PM

## 2022-07-05 ENCOUNTER — Telehealth (INDEPENDENT_AMBULATORY_CARE_PROVIDER_SITE_OTHER): Payer: 59 | Admitting: Psychiatry

## 2022-07-05 ENCOUNTER — Other Ambulatory Visit: Payer: Self-pay

## 2022-07-05 ENCOUNTER — Encounter: Payer: Self-pay | Admitting: Psychiatry

## 2022-07-05 DIAGNOSIS — F4310 Post-traumatic stress disorder, unspecified: Secondary | ICD-10-CM

## 2022-07-05 DIAGNOSIS — F411 Generalized anxiety disorder: Secondary | ICD-10-CM

## 2022-07-05 DIAGNOSIS — R41840 Attention and concentration deficit: Secondary | ICD-10-CM

## 2022-07-05 DIAGNOSIS — F3341 Major depressive disorder, recurrent, in partial remission: Secondary | ICD-10-CM

## 2022-07-05 MED ORDER — VENLAFAXINE HCL ER 75 MG PO CP24
225.0000 mg | ORAL_CAPSULE | Freq: Every day | ORAL | 1 refills | Status: DC
  Filled 2022-07-05 – 2022-07-24 (×2): qty 90, 30d supply, fill #0
  Filled 2022-08-25 (×2): qty 90, 30d supply, fill #1

## 2022-07-05 NOTE — Patient Instructions (Signed)
Continue venlafaxine 225 mg daily Continue Trazodone 12.5 mg at night as needed for insomnia Next appointment: 4/3 at 2 PM for 30 mins, video Referral for evaluation of ADHD

## 2022-07-24 ENCOUNTER — Other Ambulatory Visit: Payer: Self-pay

## 2022-07-24 MED FILL — Levothyroxine Sodium Tab 75 MCG: ORAL | 30 days supply | Qty: 30 | Fill #1 | Status: AC

## 2022-07-26 ENCOUNTER — Other Ambulatory Visit: Payer: Self-pay

## 2022-08-04 ENCOUNTER — Other Ambulatory Visit: Payer: Self-pay

## 2022-08-04 MED ORDER — NORTRIPTYLINE HCL 10 MG PO CAPS
30.0000 mg | ORAL_CAPSULE | Freq: Every evening | ORAL | 11 refills | Status: DC
  Filled 2022-08-04: qty 120, 33d supply, fill #0

## 2022-08-09 ENCOUNTER — Other Ambulatory Visit: Payer: Self-pay | Admitting: Student

## 2022-08-09 DIAGNOSIS — M542 Cervicalgia: Secondary | ICD-10-CM

## 2022-08-09 DIAGNOSIS — R519 Headache, unspecified: Secondary | ICD-10-CM

## 2022-08-19 ENCOUNTER — Ambulatory Visit
Admission: RE | Admit: 2022-08-19 | Discharge: 2022-08-19 | Disposition: A | Discharge status: Home / Self Care | Payer: 59 | Source: Ambulatory Visit | Attending: Student | Admitting: Student

## 2022-08-19 DIAGNOSIS — M4802 Spinal stenosis, cervical region: Secondary | ICD-10-CM | POA: Diagnosis not present

## 2022-08-19 DIAGNOSIS — R200 Anesthesia of skin: Secondary | ICD-10-CM | POA: Diagnosis not present

## 2022-08-19 DIAGNOSIS — M542 Cervicalgia: Secondary | ICD-10-CM

## 2022-08-19 DIAGNOSIS — M50223 Other cervical disc displacement at C6-C7 level: Secondary | ICD-10-CM | POA: Diagnosis not present

## 2022-08-19 DIAGNOSIS — R519 Headache, unspecified: Secondary | ICD-10-CM

## 2022-08-25 ENCOUNTER — Other Ambulatory Visit (HOSPITAL_COMMUNITY): Payer: Self-pay

## 2022-08-25 ENCOUNTER — Other Ambulatory Visit: Payer: Self-pay | Admitting: Certified Nurse Midwife

## 2022-08-25 ENCOUNTER — Other Ambulatory Visit: Payer: Self-pay

## 2022-08-25 MED FILL — Levothyroxine Sodium Tab 75 MCG: ORAL | 30 days supply | Qty: 30 | Fill #2 | Status: AC

## 2022-08-28 ENCOUNTER — Other Ambulatory Visit: Payer: Self-pay

## 2022-08-28 NOTE — Progress Notes (Unsigned)
Virtual Visit via Video Note  I connected with Paula [name] on 08/30/22 at  2:00 PM EDT by a video enabled telemedicine application and verified that I am speaking with the correct person using two identifiers.  Location: Patient: home Provider: office Persons participated in the visit- patient, provider    I discussed the limitations of evaluation and management by telemedicine and the availability of in person appointments. The patient expressed understanding and agreed to proceed.      I discussed the assessment and treatment plan with the patient. The patient was provided an opportunity to ask questions and all were answered. The patient agreed with the plan and demonstrated an understanding of the instructions.   The patient was advised to call back or seek an in-person evaluation if the symptoms worsen or if the condition fails to improve as anticipated.  I provided 20 minutes of non-face-to-face time during this encounter.   [name], MD    [facility] MD/PA/NP OP Progress Note  08/30/2022 2:35 PM Paula [name]  MRN:  [id number]  Chief Complaint:  Chief Complaint  Patient presents with   Follow-up   HPI:  This is a follow-up appointment for depression, anxiety and insomnia.  She states that she has been doing good.  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  She does not think of it as a job as she is enjoying.  She reports good relationship with God, and spends time on praying.  She also has a Product manager through [employer].  She thinks her children and her husband seen the changes in her.  Her husband has been offering help to her, which is a drastic change compared to the past.  She denies feeling depressed.  She feels less anxious.  Although she sleeps better after uptitration of nortriptyline by neurologist, she continues to feel tired even on days she does not take trazodone.  She denies change in appetite.  She denies SI.  She feels comfortable to stay on  the current dose of Effexor.    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 No special class She was born in [place]. Her parents were never married. She had estranged relationship with her biological father since she was a child  Visit Diagnosis:    ICD-10-CM   1. PTSD (post-traumatic stress disorder)  F43.10     2. MDD (major depressive disorder), recurrent, in partial remission  F33.41 CBC    Ferritin    3. GAD (generalized anxiety disorder)  F41.1     4. Fatigue, unspecified type  R53.83 TSH    T4, free    CBC    Ferritin    5. Hypothyroidism, unspecified type  E03.9 TSH    T4, free    6. Insomnia, unspecified type  G47.00       Past Psychiatric History: Please see initial evaluation for full details. I have reviewed the history. No updates at this time.     Past Medical History:  Past Medical History:  Diagnosis Date   Anemia    Asthma    exercise induced   BULIMIA 01/17/2008   Annotation: AGE 66 Qualifier: Diagnosis of  By: Zara Council LPN, Wanda     Complication of anesthesia    vomited during last c/s   CPD (cephalo-pelvic disproportion) 12/22/2014   Cystic fibrosis carrier in second trimester, antepartum 05/29/2014   Depression    Eczema    GERD (gastroesophageal reflux disease)    chronic   Headache    h/o migraines   Hypothyroidism    Nausea    Oligomenorrhea 12/22/2014   PCOS (polycystic ovarian syndrome)    PONV (postoperative nausea and vomiting)    Rhinitis, allergic     Past Surgical History:  Procedure Laterality  Date   CESAREAN SECTION  04/19/2013   LTCS; CPD   CESAREAN SECTION N/A 10/27/2015   Procedure: REPEAT CESAREAN SECTION;  Surgeon: Brayton Mars, MD;  Location: [facility];  Service: Obstetrics;  Laterality: N/A;   ENDOSCOPIC TURBINATE REDUCTION Bilateral 07/26/2017   Procedure: ENDOSCOPIC INFERIOR  TURBINATE REDUCTION;  Surgeon: Margaretha Sheffield, MD;  Location: [place];  Service: ENT;  Laterality: Bilateral;   FRONTAL SINUS EXPLORATION Bilateral 07/26/2017   Procedure: FRONTAL SINUS EXPLORATION;  Surgeon: Margaretha Sheffield, MD;  Location: [place];  Service: ENT;  Laterality: Bilateral;   IMAGE GUIDED SINUS SURGERY Bilateral 07/26/2017   Procedure: IMAGE GUIDED SINUS SURGERY;  Surgeon: Margaretha Sheffield, MD;  Location: [place];  Service: ENT;  Laterality: Bilateral;  GAVE DISK TO CECE 2-12   SEPTOPLASTY WITH ETHMOIDECTOMY, AND MAXILLARY ANTROSTOMY Bilateral 07/26/2017   Procedure: SEPTOPLASTY WITH TOTAL  ETHMOIDECTOMY, AND MAXILLARY ANTROSTOMYWITH REMOVAL OF TISSUE,;  Surgeon: Margaretha Sheffield, MD;  Location: [place];  Service: ENT;  Laterality: Bilateral;   TONSILLECTOMY     WISDOM TOOTH EXTRACTION      Family Psychiatric History: Please see initial evaluation for full details. I have reviewed the history. No updates at this time.     Family History:  Family History  Problem Relation Age of Onset   Endometriosis Mother    Bipolar disorder Mother    Alcohol abuse Mother    Heart disease Maternal Grandmother        valvular problem   Depression Maternal Grandmother    Breast cancer Neg Hx    Ovarian cancer Neg Hx    Colon cancer Neg Hx     Social History:  Social History   Socioeconomic History   Marital status: Married    Spouse name: Not on file   Number of children: Not on file   Years of education: Not on file   Highest education level: Not on file  Occupational History   Occupation: ED tech    Employer: [employer]  Tobacco Use   Smoking  status: Never   Smokeless tobacco: Never  Vaping Use   Vaping Use: Never used  Substance and Sexual Activity   Alcohol use: Yes    Alcohol/week: 2.0 standard drinks of alcohol    Types: 2 Standard drinks or equivalent per week    Comment: Social   Drug use: No   Sexual activity: Yes    Partners: Male    Birth control/protection: Spermicide, Coitus interruptus  Other Topics Concern   Not on file  Social History Narrative   Not on file   Social Determinants of Health   Financial Resource Strain: Not on file  Food Insecurity: Not on file  Transportation Needs: Not on  file  Physical Activity: Not on file  Stress: Not on file  Social Connections: Not on file    Allergies:  Allergies  Allergen Reactions   Iodine Anaphylaxis    Contrast    Shellfish Allergy Hives   Contrast Media [Iodinated Contrast Media]    Flagyl [Metronidazole]     Metabolic Disorder Labs: Lab Results  Component Value Date   HGBA1C 5.2 08/10/2021   Lab Results  Component Value Date   PROLACTIN 35.8 (H) 03/14/2016   PROLACTIN 43.4 12/27/2007   Lab Results  Component Value Date   CHOL 293 (H) 08/10/2021   TRIG 92 08/10/2021   HDL 64 08/10/2021   CHOLHDL 4.6 (H) 08/10/2021   VLDL 17 09/25/2011   LDLCALC 214 (H) 08/10/2021   LDLCALC 178 (H) 03/26/2019   Lab Results  Component Value Date   TSH 2.630 08/10/2021   TSH 2.070 10/06/2020    Therapeutic Level Labs: No results found for: "LITHIUM" No results found for: "VALPROATE" No results found for: "CBMZ"  Current Medications: Current Outpatient Medications  Medication Sig Dispense Refill   albuterol (PROVENTIL HFA;VENTOLIN HFA) 108 (90 Base) MCG/ACT inhaler Inhale 2 puffs into the lungs every 6 (six) hours as needed for wheezing or shortness of breath.     ARAZLO 0.045 % LOTN Apply topically.     azelastine (OPTIVAR) 0.05 % ophthalmic solution 1 drop into affected eye Ophthalmic Twice a day 30 days 6 mL 3   Drospirenone (SLYND) 4 MG  TABS Take 1 tablet (4 mg total) by mouth daily. 28 tablet 2   EPINEPHrine 0.3 mg/0.3 mL IJ SOAJ injection as directed Injection prn 30 days 2 each 1   EPSOLAY 5 % cream Apply 1 Application topically every morning.     fluticasone (FLONASE) 50 MCG/ACT nasal spray 1 spray each nostril Nasally Once a day 30 days 16 g 3   levocetirizine (XYZAL) 5 MG tablet Take 1 tablet (5 mg total) by mouth daily. 30 tablet 6   levothyroxine (SYNTHROID) 75 MCG tablet Take 1 tablet (75 mcg total) by mouth daily before breakfast. 30 tablet 11   mometasone (ELOCON) 0.1 % cream 1 application Externally Twice a day 30 days 90 g 1   montelukast (SINGULAIR) 10 MG tablet Take 10 mg by mouth as needed.     nortriptyline (PAMELOR) 10 MG capsule Take 1 capsule (10 mg total) by mouth at bedtime for 1 week, then increase to 2 capsules (20mg ) at night thereafter 60 capsule 11   nortriptyline (PAMELOR) 10 MG capsule Take 3 capsules (30 mg total) by mouth every evening for 2 weeks, then increase to 4 capsules (40 mg total) nightly and continue this dose. 120 capsule 11   phentermine (ADIPEX-P) 37.5 MG tablet Take 1 tablet (37.5 mg total) by mouth daily before breakfast. 30 tablet 0   spironolactone (ALDACTONE) 100 MG tablet TAKE 2 TABLETS BY MOUTH DAILY 180 tablet 3   traZODone (DESYREL) 50 MG tablet Take 1/4 to 1/2 tablet (12.5 - 25 mg) by mouth at night for sleep. 30 tablet 11   venlafaxine XR (EFFEXOR-XR) 75 MG 24 hr capsule Take 3 capsules (225 mg total) by mouth daily with breakfast. 90 capsule 2   [START ON 09/24/2022] venlafaxine XR (EFFEXOR-XR) 75 MG 24 hr capsule Take 3 capsules (225 mg total) by mouth daily with breakfast. 90 capsule 1   Current Facility-Administered Medications  Medication Dose Route Frequency Provider Last Rate Last Admin   cyanocobalamin ((VITAMIN B-12)) injection 1,000  mcg  1,000 mcg Intramuscular Once Philip Aspen, CNM         Musculoskeletal: Strength & Muscle Tone:  N/A Gait & Station:   N/A Patient leans: N/A  Psychiatric Specialty Exam: Review of Systems  Psychiatric/Behavioral:  Negative for agitation, behavioral problems, confusion, decreased concentration, dysphoric mood, hallucinations, self-injury, sleep disturbance and suicidal ideas. The patient is nervous/anxious. The patient is not hyperactive.   All other systems reviewed and are negative.   There were no vitals taken for this visit.There is no height or weight on file to calculate BMI.  General Appearance: Fairly Groomed  Eye Contact:  Good  Speech:  Clear and Coherent  Volume:  Normal  Mood:   better  Affect:  Appropriate, Congruent, and calm  Thought Process:  Coherent  Orientation:  Full (Time, Place, and Person)  Thought Content: Logical   Suicidal Thoughts:  No  Homicidal Thoughts:  No  Memory:  Immediate;   Good  Judgement:  Good  Insight:  Good  Psychomotor Activity:  Normal  Concentration:  Concentration: Good and Attention Span: Good  Recall:  Good  Fund of Knowledge: Good  Language: Good  Akathisia:  No  Handed:  Right  AIMS (if indicated): not done  Assets:  Communication Skills Desire for Improvement  ADL's:  Intact  Cognition: WNL  Sleep:  Good   Screenings: GAD-7    Flowsheet Row Office Visit from 06/16/2020 in [facility] Visit from 04/28/2020 in [facility]  Total GAD-7 Score 7 8      PHQ2-9    [place] Office Visit from 10/20/2021 in [place] Video Visit from 07/27/2021 in [place] Video Visit from 03/30/2021 in [place] Video Visit from 01/26/2021 in [place] Video Visit from 11/10/2020 in [place]  PHQ-2 Total Score 0 1 0 0 0  PHQ-9 Total Score 1 -- -- -- --      Flowsheet Row Video Visit from 03/30/2021 in [place] Video Visit from 11/10/2020 in [place] Video Visit from 10/06/2020 in [place] No Risk No Risk No Risk        Assessment and Plan:  Paula [name] is a 42 y.o. year old female with a history of PTSD, depression, anxiety, GERD, hypothyroidism, PCOS, who presents for follow up appointment for below.   1. PTSD (post-traumatic stress disorder) 2. MDD (major depressive disorder), recurrent, in partial remission 3. GAD (generalized anxiety disorder) Acute stressors include: conflict with her daughter, occasional marital conflict, recent overdose of her mother  Other stressors include: childhood trauma, neck pain   History:   There has been overall improvement in depressive symptoms and anxiety since the last visit.  Will continue current dose of venlafaxine to target depression, PTSD and anxiety.  Noted that she has been prescribed nortriptyline by her neurologist.  Discussed potential risk of serotonin syndrome.   4. Fatigue, unspecified type 5. Hypothyroidism, unspecified type She reports fatigue through the day.  Will obtain labs to rule out any medical health issues contributing to her symptoms.   # Insomnia Overall improving since nortriptyline was up titrated by her neurologist.  Will continue current dose of trazodone as needed for insomnia.    # Alcohol use She reports history of binge drinking.  She denies any craving for alcohol.  She is not interested in pharmacological treatment.  Will continue to assess this.    plan Continue venlafaxine 225 mg daily Continue Trazodone 12.5 mg at night as needed for insomnia Next appointment: 5/29 at 1 20,  video Obtain labs- TSH, free T4, CBC, ferritin Referred for evaluation of ADHD - on nortriptyline 40 mg at night, prescribed by neurologist - on phentermine   Past trials of medication:  sertraline, fluoxetine (nightmares), L-theanine , Ambien, Trazodone   The patient demonstrates the following risk factors for suicide: Chronic risk factors for suicide include: psychiatric disorder of depression, anxiety. Acute risk factors for suicide include: N/A. Protective factors for this patient include: positive social support, coping skills and hope for the future. Considering these factors, the overall suicide risk at this point appears to be low. Patient is appropriate for outpatient follow up.      Collaboration of Care: Collaboration of Care: Other reviewed notes in Epic  Patient/Guardian was advised Release of Information must be obtained prior to any record release in order to collaborate their care with an outside provider. Patient/Guardian was advised if they have not already done so to contact the registration department to sign all necessary forms in order for [place] to release information regarding their care.   Consent: Patient/Guardian gives verbal consent for treatment and assignment of benefits for services provided during this visit. Patient/Guardian expressed understanding and agreed to proceed.    [name], MD 08/30/2022, 2:35 PM

## 2022-08-30 ENCOUNTER — Encounter: Payer: Self-pay | Admitting: Psychiatry

## 2022-08-30 ENCOUNTER — Other Ambulatory Visit: Payer: Self-pay

## 2022-08-30 ENCOUNTER — Telehealth (INDEPENDENT_AMBULATORY_CARE_PROVIDER_SITE_OTHER): Payer: 59 | Admitting: Psychiatry

## 2022-08-30 DIAGNOSIS — G4700 Insomnia, unspecified: Secondary | ICD-10-CM | POA: Diagnosis not present

## 2022-08-30 DIAGNOSIS — E039 Hypothyroidism, unspecified: Secondary | ICD-10-CM

## 2022-08-30 DIAGNOSIS — F3341 Major depressive disorder, recurrent, in partial remission: Secondary | ICD-10-CM

## 2022-08-30 DIAGNOSIS — F411 Generalized anxiety disorder: Secondary | ICD-10-CM

## 2022-08-30 DIAGNOSIS — F4310 Post-traumatic stress disorder, unspecified: Secondary | ICD-10-CM | POA: Diagnosis not present

## 2022-08-30 DIAGNOSIS — R5383 Other fatigue: Secondary | ICD-10-CM

## 2022-08-30 MED ORDER — VENLAFAXINE HCL ER 75 MG PO CP24
225.0000 mg | ORAL_CAPSULE | Freq: Every day | ORAL | 1 refills | Status: DC
  Filled 2022-08-30 – 2022-10-25 (×2): qty 90, 30d supply, fill #0
  Filled 2022-11-23: qty 90, 30d supply, fill #1

## 2022-08-30 NOTE — Patient Instructions (Signed)
Continue venlafaxine 225 mg daily Continue Trazodone 12.5 mg at night as needed for insomnia Next appointment: 5/29 at 1 20,  video Obtain labs - TSH, free T4, CBC, ferritin

## 2022-09-04 ENCOUNTER — Other Ambulatory Visit: Payer: Self-pay

## 2022-09-04 MED ORDER — GABAPENTIN 100 MG PO CAPS
ORAL_CAPSULE | ORAL | 3 refills | Status: AC
  Filled 2022-09-04 – 2022-09-22 (×2): qty 120, 30d supply, fill #0
  Filled 2022-11-08: qty 120, 30d supply, fill #1

## 2022-09-05 DIAGNOSIS — E039 Hypothyroidism, unspecified: Secondary | ICD-10-CM | POA: Diagnosis not present

## 2022-09-05 DIAGNOSIS — R5383 Other fatigue: Secondary | ICD-10-CM | POA: Diagnosis not present

## 2022-09-05 DIAGNOSIS — F3341 Major depressive disorder, recurrent, in partial remission: Secondary | ICD-10-CM | POA: Diagnosis not present

## 2022-09-06 ENCOUNTER — Encounter: Payer: Self-pay | Admitting: Psychiatry

## 2022-09-06 LAB — CBC
Hematocrit: 41.3 % (ref 34.0–46.6)
Hemoglobin: 14.2 g/dL (ref 11.1–15.9)
MCH: 31.1 pg (ref 26.6–33.0)
MCHC: 34.4 g/dL (ref 31.5–35.7)
MCV: 91 fL (ref 79–97)
Platelets: 261 10*3/uL (ref 150–450)
RBC: 4.56 x10E6/uL (ref 3.77–5.28)
RDW: 12 % (ref 11.7–15.4)
WBC: 6.5 10*3/uL (ref 3.4–10.8)

## 2022-09-06 LAB — T4, FREE: Free T4: 1.1 ng/dL (ref 0.82–1.77)

## 2022-09-06 LAB — FERRITIN: Ferritin: 168 ng/mL — ABNORMAL HIGH (ref 15–150)

## 2022-09-06 LAB — TSH: TSH: 1.04 u[IU]/mL (ref 0.450–4.500)

## 2022-09-18 ENCOUNTER — Other Ambulatory Visit: Payer: Self-pay

## 2022-09-22 ENCOUNTER — Other Ambulatory Visit: Payer: Self-pay | Admitting: Certified Nurse Midwife

## 2022-09-22 ENCOUNTER — Other Ambulatory Visit: Payer: Self-pay

## 2022-09-22 MED ORDER — SLYND 4 MG PO TABS
1.0000 | ORAL_TABLET | Freq: Every day | ORAL | 0 refills | Status: DC
  Filled 2022-09-22: qty 28, 28d supply, fill #0

## 2022-09-22 MED FILL — Levothyroxine Sodium Tab 75 MCG: ORAL | 30 days supply | Qty: 30 | Fill #3 | Status: AC

## 2022-09-22 NOTE — Telephone Encounter (Signed)
Patient has contacted office requesting refill today on birth control, patient has scheduled her annual physical with Guadlupe Spanish on 10/19/22. KW

## 2022-10-03 DIAGNOSIS — M21271 Flexion deformity, right ankle and toes: Secondary | ICD-10-CM | POA: Diagnosis not present

## 2022-10-03 DIAGNOSIS — M2012 Hallux valgus (acquired), left foot: Secondary | ICD-10-CM | POA: Diagnosis not present

## 2022-10-03 DIAGNOSIS — M2011 Hallux valgus (acquired), right foot: Secondary | ICD-10-CM | POA: Diagnosis not present

## 2022-10-03 DIAGNOSIS — L603 Nail dystrophy: Secondary | ICD-10-CM | POA: Diagnosis not present

## 2022-10-03 DIAGNOSIS — M79671 Pain in right foot: Secondary | ICD-10-CM | POA: Diagnosis not present

## 2022-10-03 DIAGNOSIS — M21272 Flexion deformity, left ankle and toes: Secondary | ICD-10-CM | POA: Diagnosis not present

## 2022-10-03 DIAGNOSIS — M205X1 Other deformities of toe(s) (acquired), right foot: Secondary | ICD-10-CM | POA: Diagnosis not present

## 2022-10-03 DIAGNOSIS — M205X2 Other deformities of toe(s) (acquired), left foot: Secondary | ICD-10-CM | POA: Diagnosis not present

## 2022-10-03 DIAGNOSIS — M79672 Pain in left foot: Secondary | ICD-10-CM | POA: Diagnosis not present

## 2022-10-03 DIAGNOSIS — B351 Tinea unguium: Secondary | ICD-10-CM | POA: Diagnosis not present

## 2022-10-04 ENCOUNTER — Ambulatory Visit: Payer: 59 | Admitting: Psychology

## 2022-10-04 DIAGNOSIS — F89 Unspecified disorder of psychological development: Secondary | ICD-10-CM | POA: Diagnosis not present
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
## 2022-10-12 ENCOUNTER — Ambulatory Visit: Payer: 59 | Admitting: Psychology

## 2022-10-12 DIAGNOSIS — F89 Unspecified disorder of psychological development: Secondary | ICD-10-CM

## 2022-10-12 NOTE — Progress Notes (Signed)
Date: 10/12/2022   Appointment Start Time: 9:04am Duration: 90 minutes Provider: Helmut Muster, PsyD Type of Session: Testing Appointment for Evaluation  Location of Patient: Home Location of Provider: Provider's Home (private office) Type of Contact: [employer] Teams video visit with audio  Session Content: Today's appointment was a telepsychological visit due to COVID-19. Alohilani is aware it is her responsibility to secure confidentiality on her end of the session. Prior to proceeding with today's appointment, Etha's physical location at the time of this appointment was obtained as well a phone number she could be reached at in the event of technical difficulties. Keiyana denied anyone else being present in the room or on the virtual appointment. This provider reviewed that video should not be captured, photos should not be taken, nor should testing stimuli be copied or recorded as it would be a copyright violation. Rowynn expressed understanding of the aforementioned, and verbally consented to proceed. The WAIS-IV was administered, scored, and interpreted by this evaluator  Billing codes will be input on the feedback appointment. There are no billing codes for the testing appointment.   Provisional DSM-5 diagnosis(es):  F89 Unspecified Disorder of Psychological Development   Plan: Jamilet was scheduled for a feedback appointment on 10/25/2022 at 2pm via [employer] video visit with audio.                Margarite Gouge, PsyD

## 2022-10-13 DIAGNOSIS — M21271 Flexion deformity, right ankle and toes: Secondary | ICD-10-CM | POA: Diagnosis not present

## 2022-10-13 DIAGNOSIS — Q66221 Congenital metatarsus adductus, right foot: Secondary | ICD-10-CM | POA: Diagnosis not present

## 2022-10-13 DIAGNOSIS — M21272 Flexion deformity, left ankle and toes: Secondary | ICD-10-CM | POA: Diagnosis not present

## 2022-10-13 DIAGNOSIS — M2012 Hallux valgus (acquired), left foot: Secondary | ICD-10-CM | POA: Diagnosis not present

## 2022-10-13 DIAGNOSIS — M205X2 Other deformities of toe(s) (acquired), left foot: Secondary | ICD-10-CM | POA: Diagnosis not present

## 2022-10-13 DIAGNOSIS — Q66222 Congenital metatarsus adductus, left foot: Secondary | ICD-10-CM | POA: Diagnosis not present

## 2022-10-13 DIAGNOSIS — M205X1 Other deformities of toe(s) (acquired), right foot: Secondary | ICD-10-CM | POA: Diagnosis not present

## 2022-10-13 DIAGNOSIS — M25871 Other specified joint disorders, right ankle and foot: Secondary | ICD-10-CM | POA: Diagnosis not present

## 2022-10-13 DIAGNOSIS — M79671 Pain in right foot: Secondary | ICD-10-CM | POA: Diagnosis not present

## 2022-10-13 DIAGNOSIS — M5412 Radiculopathy, cervical region: Secondary | ICD-10-CM | POA: Diagnosis not present

## 2022-10-13 DIAGNOSIS — M2011 Hallux valgus (acquired), right foot: Secondary | ICD-10-CM | POA: Diagnosis not present

## 2022-10-18 ENCOUNTER — Encounter
Admit: 2022-10-18 | Discharge: 2022-10-18 | Payer: PRIVATE HEALTH INSURANCE | Attending: Student in an Organized Health Care Education/Training Program

## 2022-10-18 ENCOUNTER — Inpatient Hospital Stay: Payer: MEDICAID

## 2022-10-18 DIAGNOSIS — Z113 Encounter for screening for infections with a predominantly sexual mode of transmission: Secondary | ICD-10-CM

## 2022-10-18 NOTE — Progress Notes (Signed)
OB/GYN Problem Visit  [facility]     Olivia Castro  10/18/2022                       Primary Care Physician: No primary care provider on file.    CC:   Chief Complaint   Patient presents with    Other     Screen STD         HPI: Olivia Castro is a 42 y.o. female [phone]  here for problem visit and is desires STD testing.    The patient reports she is not currently sexually and hasn't been in about 8 months. She has previously used condoms. She is moving away for school and wants to be tested. She denies any symptoms. The patient declined to discuss her menstrual cycles however reports she is currently on her period. She declines to discuss any aspects of her obstetric, medical, or surgical history at this time because she only came in because she wanted STD testing. She is also requesting a pap smear, education provided about cervical cancer screening. The patient is requesting not to be notified of any results over the phone or through mychart but prefers to have a follow up appointment to discuss anything. Patient's last menstrual period was 10/13/2022 (exact date).     REVIEW OF SYSTEMS:  Constitutional: negative fever, negative chills, negative weight changes   HEENT: negative visual disturbances, negative headaches, negative dizziness, negative hearing loss  Breast: Negative breast abnormalities, negative breast lumps, negative nipple discharge  Respiratory: negative dyspnea, negative cough, negative SOB  Cardiovascular: negative chest pain,  negative palpitations, negative arrhythmia, negative syncope   Gastrointestinal: negative abdominal pain, negative RUQ pain, negative N/V, negative diarrhea, negative constipation, negative bowel changes, negative heartburn   Genitourinary: negative dysuria, negative hematuria, negative urinary incontinence, negative vaginal discharge, positive vaginal bleeding  Dermatological: negative rash, negative pruritis, negative mole or other skin  changes  Hematologic: negative bruising  Immunologic/Lymphatic: negative recent illness, negative recent sick contact  Musculoskeletal: negative back pain, negative myalgias, negative arthralgias  Neurological:  negative dizziness, negative migraines, negative seizures, negative weakness  Behavior/Psych: negative depression, negative anxiety, negative SI, negative HI  ________________________________________________________________________    OBSTETRICAL HISTORY:  OB History   Gravida Para Term Preterm AB Living   3 1 1  0 2 1   SAB IAB Ectopic Molar Multiple Live Births   0 0 2 0 0 1      # Outcome Date GA Lbr Len/2nd Weight Sex Delivery Anes PTL Lv   3 Term 2000    F Vag-Spont   LIV   2 Ectopic            1 Ectopic                PAST MEDICAL HISTORY:  No past medical history on file.    PAST SURGICAL HISTORY:                                                                    Procedure Laterality Date    ECTOPIC PREGNANCY SURGERY         MEDICATIONS:  Current Outpatient Medications   Medication Sig Dispense Refill    levothyroxine (  SYNTHROID) 75 MCG tablet        No current facility-administered medications for this visit.       ALLERGIES:  Allergies as of 10/18/2022 - Fully Reviewed 10/18/2022   Allergen Reaction Noted    Iodine  05/31/2018                                   VITALS:  Vitals:    10/18/22 1448   BP: 116/78   Site: Right Upper Arm   Position: Sitting   Cuff Size: Medium Adult   Pulse: 63   Weight: 86.2 kg (190 lb)   Height: 1.626 m (5\' 4" )                                                                                                                                                                          PHYSICAL EXAM:   General Appearance: Appears healthy.  Alert; in no acute distress.  Skin: Skin color, texture, turgor normal. No rashes or lesions.  HEENT: normocephalic and atraumatic  Respiratory: no conversational dyspnea  Cardiovascular: normal rate and regular rhythm  Abdomen: soft, non-tender,  non-distended, no right upper quadrant tenderness, and no CVA tenderness  Pelvic Exam:   Chaperone for Intimate Exam: Chaperone was offered and declined  External genitalia: normal general appearance without lesions  Urinary system: urethral meatus normal, bladder nontender  Vaginal: normal mucosa, scant blood in vaginal vault  Cervix: normal appearance without discharge or lesions, no CMT  Adnexa: nontender, no masses  Uterus: normal single, mobile, nontender  Musculoskeletal: no gross abnormalities  Extremities: non-tender BLE and non-edematous  Psych:  oriented to time, place and person, mood and affect are within normal limits       ASSESSMENT & PLAN:    Olivia Castro is a 42 y.o. female [phone] requesting STD testing   - Vitals stable   - Vaginitis and GC/C collected   - Hepatitis, HIV, Tpal ordered   - Patient declined to discuss or elaborate on any other aspects of her obstetric, medical or surgical history at this time   - Last pap smear NILM 07/12/20, due 06/2023, no history of abnormal   - Counseling provided with regards to yearly well woman exams and breast cancer screening to start at age 40   - Encourage follow up for annual exam after 11/2022    Patient Active Problem List    Diagnosis Date Noted    History of ectopic pregnancy x2 10/18/2022    Family history of breast cancer 07/12/2020       Return for annual after July 2024.    Patient was seen with  total face to face time of 20 minutes. More than 50% of this visit was on counseling and education regarding her diagnose(s) as listed below and options. She was also counseled on her preventative health maintenance recommendations and follow-up.      Diagnosis Orders   1. Screen for STD (sexually transmitted disease)  Vaginitis DNA Probe    C.trachomatis N.gonorrhoeae DNA    HIV Screen    T. pallidum Ab    16109 - Venipuncture    Hepatitis Panel, Acute          Alver Fisher, DO  [facility]  10/18/2022, 3:18 PM

## 2022-10-19 ENCOUNTER — Ambulatory Visit (INDEPENDENT_AMBULATORY_CARE_PROVIDER_SITE_OTHER): Payer: 59 | Admitting: Obstetrics

## 2022-10-19 ENCOUNTER — Encounter: Payer: Self-pay | Admitting: Obstetrics

## 2022-10-19 ENCOUNTER — Other Ambulatory Visit: Payer: Self-pay

## 2022-10-19 VITALS — BP 100/70 | HR 85 | Ht 59.0 in | Wt 159.0 lb

## 2022-10-19 DIAGNOSIS — E282 Polycystic ovarian syndrome: Secondary | ICD-10-CM

## 2022-10-19 DIAGNOSIS — L680 Hirsutism: Secondary | ICD-10-CM

## 2022-10-19 DIAGNOSIS — R5382 Chronic fatigue, unspecified: Secondary | ICD-10-CM

## 2022-10-19 DIAGNOSIS — Z01419 Encounter for gynecological examination (general) (routine) without abnormal findings: Secondary | ICD-10-CM

## 2022-10-19 DIAGNOSIS — Z1231 Encounter for screening mammogram for malignant neoplasm of breast: Secondary | ICD-10-CM

## 2022-10-19 LAB — HEPATITIS PANEL, ACUTE
Hep A IgM: NONREACTIVE
Hep B Core Ab, IgM: NONREACTIVE
Hepatitis B Surface Ag: NONREACTIVE
Hepatitis C Ab: NONREACTIVE

## 2022-10-19 LAB — C.TRACHOMATIS N.GONORRHOEAE DNA
C. trachomatis DNA: NEGATIVE
N. gonorrhoeae DNA: NEGATIVE

## 2022-10-19 LAB — VAGINITIS DNA PROBE
Candida species: NEGATIVE
GARDNERELLA VAGINALIS: NEGATIVE
Trichomonas: NEGATIVE

## 2022-10-19 LAB — HIV SCREEN: HIV Ag/Ab: NONREACTIVE

## 2022-10-19 LAB — T. PALLIDUM AB: T. pallidum, IgG: NONREACTIVE

## 2022-10-19 MED ORDER — EFLORNITHINE HCL 13.9 % EX CREA
1.0000 "application " | TOPICAL_CREAM | Freq: Two times a day (BID) | CUTANEOUS | 6 refills | Status: AC
  Filled 2022-10-19 – 2022-12-25 (×3): qty 45, 30d supply, fill #0
  Filled 2023-02-28: qty 45, 90d supply, fill #0
  Filled 2023-03-26: qty 45, 30d supply, fill #0
  Filled 2023-04-09: qty 45, fill #0

## 2022-10-19 MED ORDER — SLYND 4 MG PO TABS
1.0000 | ORAL_TABLET | Freq: Every day | ORAL | 3 refills | Status: AC
  Filled 2022-10-19 – 2022-11-08 (×2): qty 84, 84d supply, fill #0
  Filled 2022-12-28 – 2023-01-24 (×3): qty 84, 84d supply, fill #1
  Filled 2023-02-28 – 2023-03-26 (×2): qty 84, 84d supply, fill #2
  Filled 2023-07-14: qty 84, 84d supply, fill #3

## 2022-10-19 NOTE — Progress Notes (Signed)
ANNUAL GYNECOLOGICAL EXAM  SUBJECTIVE  HPI  Paula [name] is a 42 y.o.-year-old G2P2002 who presents for an annual gynecological exam today.  She denies pelvic pain, dyspareunia, abnormal vaginal bleeding or discharge, and UTI symptoms. She has PCOS and is concerned about the regrowth of facial hair despite laser hair removal and spironolactone. She has used estrogen-containing contraceptives in the past, but has had to stop d/t increased BP and other systemic effects. She has gained 20 lbs since November and would like to get back into the weight loss program. She reports that she has no energy and is tired all the time. Her often wakes at night d/t neck pain from bulging discs.   Medical/Surgical History Past Medical History:  Diagnosis Date   Anemia    Asthma    exercise induced   BULIMIA 01/17/2008   Annotation: AGE 77 Qualifier: Diagnosis of  By: Lorrene Reid LPN, Wanda     Complication of anesthesia    vomited during last c/s   CPD (cephalo-pelvic disproportion) 12/22/2014   Cystic fibrosis carrier in second trimester, antepartum 05/29/2014   Depression    Eczema    GERD (gastroesophageal reflux disease)    chronic   Headache    h/o migraines   Hypothyroidism    Nausea    Oligomenorrhea 12/22/2014   PCOS (polycystic ovarian syndrome)    PONV (postoperative nausea and vomiting)    Rhinitis, allergic    Past Surgical History:  Procedure Laterality Date   CESAREAN SECTION  04/19/2013   LTCS; CPD   CESAREAN SECTION N/A 10/27/2015   Procedure: REPEAT CESAREAN SECTION;  Surgeon: Herold Harms, MD;  Location: [facility];  Service: Obstetrics;  Laterality: N/A;   ENDOSCOPIC TURBINATE REDUCTION Bilateral 07/26/2017   Procedure: ENDOSCOPIC INFERIOR  TURBINATE REDUCTION;  Surgeon: Vernie Murders, MD;  Location: [facility];  Service: ENT;  Laterality: Bilateral;   FRONTAL SINUS EXPLORATION Bilateral 07/26/2017   Procedure: FRONTAL SINUS EXPLORATION;  Surgeon: Vernie Murders, MD;  Location: [facility];  Service: ENT;  Laterality: Bilateral;   IMAGE GUIDED SINUS SURGERY Bilateral 07/26/2017   Procedure: IMAGE GUIDED SINUS SURGERY;  Surgeon: Vernie Murders, MD;  Location: [facility];  Service: ENT;  Laterality: Bilateral;  GAVE DISK TO CECE 2-12   SEPTOPLASTY WITH ETHMOIDECTOMY, AND MAXILLARY ANTROSTOMY Bilateral 07/26/2017   Procedure: SEPTOPLASTY WITH TOTAL  ETHMOIDECTOMY, AND MAXILLARY ANTROSTOMYWITH REMOVAL OF TISSUE,;  Surgeon: Vernie Murders, MD;  Location: [facility];  Service: ENT;  Laterality: Bilateral;   TONSILLECTOMY     WISDOM TOOTH EXTRACTION      Social History Lives with husband and children. 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           GYN/Menstrual History No LMP recorded. (Menstrual status: Irregular Periods). Last Pap: 08/10/21, ASCUS with negative HPV.  Contraception: Slynd  Prevention Mammogram: will start at 42 Colonoscopy: at 60   Current Medications Outpatient Medications  Prior to Visit  Medication Sig   albuterol (PROVENTIL HFA;VENTOLIN HFA) 108 (90 Base) MCG/ACT inhaler Inhale 2 puffs into the lungs every 6 (six) hours as needed for wheezing or shortness of breath.   azelastine (OPTIVAR) 0.05 % ophthalmic solution 1 drop into affected eye Ophthalmic Twice a day 30 days   Drospirenone (SLYND) 4 MG TABS Take 1 tablet (4 mg total) by mouth daily.   EPINEPHrine 0.3 mg/0.3 mL IJ SOAJ injection as directed Injection prn 30 days   EPSOLAY 5 % cream Apply 1 Application topically every morning.   fluticasone (FLONASE) 50 MCG/ACT nasal spray 1 spray each nostril Nasally Once a day 30  days   gabapentin (NEURONTIN) 100 MG capsule Take 1 capsule (100 mg total) by mouth 2 (two) times daily for 7 days, THEN 2 capsules (200 mg total) 2 (two) times daily and continue this dose.   levocetirizine (XYZAL) 5 MG tablet Take 1 tablet (5 mg total) by mouth daily.   levothyroxine (SYNTHROID) 75 MCG tablet Take 1 tablet (75 mcg total) by mouth daily before breakfast.   traZODone (DESYREL) 50 MG tablet Take 1/4 to 1/2 tablet (12.5 - 25 mg) by mouth at night for sleep.   venlafaxine XR (EFFEXOR-XR) 75 MG 24 hr capsule Take 3 capsules (225 mg total) by mouth daily with breakfast.   ARAZLO 0.045 % LOTN Apply topically. (Patient not taking: Reported on 10/19/2022)   mometasone (ELOCON) 0.1 % cream 1 application Externally Twice a day 30 days (Patient not taking: Reported on 10/19/2022)   montelukast (SINGULAIR) 10 MG tablet Take 10 mg by mouth as needed. (Patient not taking: Reported on 10/19/2022)   nortriptyline (PAMELOR) 10 MG capsule Take 1 capsule (10 mg total) by mouth at bedtime for 1 week, then increase to 2 capsules (20mg ) at night thereafter (Patient not taking: Reported on 10/19/2022)   phentermine (ADIPEX-P) 37.5 MG tablet Take 1 tablet (37.5 mg total) by mouth daily before breakfast. (Patient not taking: Reported on 10/19/2022)   spironolactone (ALDACTONE) 100 MG tablet TAKE 2 TABLETS BY MOUTH DAILY (Patient not taking: Reported on 10/19/2022)   venlafaxine XR (EFFEXOR-XR) 75 MG 24 hr capsule Take 3 capsules (225 mg total) by mouth daily with breakfast. (Patient not taking: Reported on 10/19/2022)   Facility-Administered Medications Prior to Visit  Medication Dose Route Frequency Provider   cyanocobalamin ((VITAMIN B-12)) injection 1,000 mcg  1,000 mcg Intramuscular Once Doreene Burke, CNM      [facility] - 10/19/22 1530       Pregnancy Intention Screening   Does the patient want to become pregnant in the next year? No    Does the patient's partner want to become pregnant in the next  year? No    Would the patient like to discuss contraceptive options today? No      Contraception Wrap Up   Current Method Oral Contraceptive    End Method Oral Contraceptive    Contraception Counseling Provided No            The pregnancy intention screening data noted above was reviewed. Potential methods of contraception were discussed. The patient elected to proceed with Oral Contraceptive.   ROS Constitutional: Denied constitutional symptoms, night sweats, recent illness, fatigue, fever, insomnia and weight loss.  Eyes: Denied eye symptoms, eye pain, photophobia, vision change and visual disturbance.  Ears/Nose/Throat/Neck: Denied ear, nose, throat or neck symptoms, hearing loss, nasal discharge, sinus congestion and sore throat.  Cardiovascular: Denied cardiovascular symptoms, arrhythmia, chest pain/pressure, edema, exercise  intolerance, orthopnea and palpitations.  Respiratory: Denied pulmonary symptoms, asthma, pleuritic pain, productive sputum, cough, dyspnea and wheezing.  Gastrointestinal: Denied, gastro-esophageal reflux, melena, nausea and vomiting.  Genitourinary: Denied genitourinary symptoms including symptomatic vaginal discharge, pelvic relaxation issues, and urinary complaints.  Musculoskeletal: Denied musculoskeletal symptoms, stiffness, swelling, muscle weakness and myalgia. +neck pain  Dermatologic: +rash under breast  Neurologic: Denied neurology symptoms, dizziness, headache, neck pain and syncope.  Psychiatric: +anxiety, being evaluated for ADHD  Endocrine: Denied endocrine symptoms including hot flashes and night sweats.    OBJECTIVE  BP 100/70   Pulse 85   Ht 4\' 11"  (1.499 m)   Wt 159 lb (72.1 kg)   BMI 32.11 kg/m    Physical examination General NAD, Conversant  HEENT Atraumatic.  Normo-cephalic. Pupils reactive. Anicteric sclerae  Thyroid/Neck Smooth without nodularity or enlargement. Normal ROM.  Neck Supple.  Skin No rashes, lesions or  ulceration. Normal palpated skin turgor. No nodularity.  Breasts: No masses or discharge.  Symmetric.  No axillary adenopathy. Mildly erythematous area under right breast that appears to be contact dermatitis vs yeast  Lungs: Clear to auscultation.No rales or wheezes. Normal Respiratory effort, no retractions.  Heart: NSR.  No murmurs or rubs appreciated. No peripheral edema  Abdomen: Soft.  Non-tender.  No masses.  No HSM. No hernia  Extremities: Moves all appropriately.  Normal ROM for age. No lymphadenopathy.  Neuro: Oriented to PPT.  Normal mood. Normal affect.     Pelvic: declined    ASSESSMENT  1) Annual exam 2) H/o ASCUS Pap with negative HPV 3) Unwanted hair growth 4) Fatigue 5) Desires contraception  PLAN 1) Physical exam as noted. Discussed healthy lifestyle choices and preventive care. Labs: A1C, lipid profile, CMP 2) Per ASCCP guidelines, repeat Pap in 2026 3) Rx for Vaniqa sent to pharmacy 4) Discussed possibility of sleep study. Will check vitamin d and b12 levels. TSH/T4 and CBC WNL. 5) Refill for [facility] sent to pharmacy  Return in one year for annual exam or as needed for concerns. Follow up with Doreene Burke, CNM to discuss potential for resumption of phentermine.  Paula [name], CNM

## 2022-10-25 ENCOUNTER — Ambulatory Visit (INDEPENDENT_AMBULATORY_CARE_PROVIDER_SITE_OTHER): Payer: 59 | Admitting: Psychology

## 2022-10-25 ENCOUNTER — Other Ambulatory Visit (HOSPITAL_COMMUNITY): Payer: Self-pay

## 2022-10-25 ENCOUNTER — Telehealth: Payer: 59 | Admitting: Psychiatry

## 2022-10-25 ENCOUNTER — Other Ambulatory Visit: Payer: Self-pay

## 2022-10-25 DIAGNOSIS — F902 Attention-deficit hyperactivity disorder, combined type: Secondary | ICD-10-CM

## 2022-10-25 DIAGNOSIS — F4310 Post-traumatic stress disorder, unspecified: Secondary | ICD-10-CM
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
## 2022-10-26 ENCOUNTER — Other Ambulatory Visit (HOSPITAL_COMMUNITY): Payer: Self-pay

## 2022-10-26 ENCOUNTER — Other Ambulatory Visit: Payer: Self-pay

## 2022-10-27 ENCOUNTER — Other Ambulatory Visit: Payer: Self-pay

## 2022-10-30 ENCOUNTER — Other Ambulatory Visit: Payer: Self-pay

## 2022-10-30 ENCOUNTER — Telehealth: Payer: Self-pay | Admitting: Psychiatry

## 2022-10-30 NOTE — Telephone Encounter (Signed)
Reviewed a record from [facility], documented 10/26/2022.  Psychological evaluation by Helmut Muster, Psy.D. Dx- ADHD, combined presentation, severe, PTSD

## 2022-11-01 ENCOUNTER — Telehealth: Payer: 59 | Admitting: Psychiatry

## 2022-11-02 DIAGNOSIS — M5412 Radiculopathy, cervical region: Secondary | ICD-10-CM | POA: Diagnosis not present

## 2022-11-03 ENCOUNTER — Other Ambulatory Visit: Payer: Self-pay

## 2022-11-04 ENCOUNTER — Other Ambulatory Visit (HOSPITAL_COMMUNITY): Payer: Self-pay

## 2022-11-05 NOTE — Progress Notes (Signed)
BH MD/PA/NP OP Progress Note  11/09/2022 4:05 PM Paula [name]  MRN:  [id number]  Chief Complaint:  Chief Complaint  Patient presents with   Follow-up   HPI:  This is a follow-up appointment for depression, PTSD, anxiety.   She states that she has been stressed.  Her grandfather is not doing well, and he is losing memory.  She also reports occasional conflict with her husband.  He has been busy, owing lawn care business on top of his work.  She has been busy taking care of her children at work.  They have occasional argument, and she wants some grace.  The relationship has been getting better lately.  She is unsure why she has gained weight.  She is hoping to go back in boot camp after improvement in her neck pain. The patient has mood symptoms as in PHQ-9/GAD-7. She denies SI.   ADHD-she states that she has been forgetful.  She takes small misses such as putting ibuprofen in the refrigerator.  She would like to be a best version of her in front of her children and her husband.  She wants to stay on track.  She tends to feel overwhelmed and exhausted as she is unable to do tasks or multitasking.  She is willing to try the medication for this.   Substance use  Tobacco Alcohol Other substances/  Current denies Glass of wine per week denies  Past denies binge drinking, a bottle of wine , last in Jan 2024  denies  Past Treatment        Wt Readings from Last 3 Encounters:  11/09/22 161 lb 12.8 oz (73.4 kg)  10/19/22 159 lb (72.1 kg)  06/12/22 149 lb (67.6 kg)     Visit Diagnosis:    ICD-10-CM   1. PTSD (post-traumatic stress disorder)  F43.10     2. MDD (major depressive disorder), recurrent, in partial remission (HCC)  F33.41     3. GAD (generalized anxiety disorder)  F41.1     4. Attention deficit hyperactivity disorder (ADHD), unspecified ADHD type  F90.9 Urine drugs of abuse scrn w alc, routine (Ref Lab)      Past Psychiatric History: Please see initial evaluation for  full details. I have reviewed the history. No updates at this time.     Past Medical History:  Past Medical History:  Diagnosis Date   Anemia    Asthma    exercise induced   BULIMIA 01/17/2008   Annotation: AGE 67 Qualifier: Diagnosis of  By: Lorrene Reid LPN, Wanda     Complication of anesthesia    vomited during last c/s   CPD (cephalo-pelvic disproportion) 12/22/2014   Cystic fibrosis carrier in second trimester, antepartum 05/29/2014   Depression    Eczema    GERD (gastroesophageal reflux disease)    chronic   Headache    h/o migraines   Hypothyroidism    Nausea    Oligomenorrhea 12/22/2014   PCOS (polycystic ovarian syndrome)    PONV (postoperative nausea and vomiting)    Rhinitis, allergic     Past Surgical History:  Procedure Laterality Date   CESAREAN SECTION  04/19/2013   LTCS; CPD   CESAREAN SECTION N/A 10/27/2015   Procedure: REPEAT CESAREAN SECTION;  Surgeon: Herold Harms, MD;  Location: [facility];  Service: Obstetrics;  Laterality: N/A;   ENDOSCOPIC TURBINATE REDUCTION Bilateral 07/26/2017   Procedure: ENDOSCOPIC INFERIOR  TURBINATE REDUCTION;  Surgeon: Vernie Murders, MD;  Location: [facility];  Service: ENT;  Laterality: Bilateral;   FRONTAL SINUS EXPLORATION Bilateral 07/26/2017   Procedure: FRONTAL SINUS EXPLORATION;  Surgeon: Vernie Murders, MD;  Location: [facility];  Service: ENT;  Laterality: Bilateral;   IMAGE GUIDED SINUS SURGERY Bilateral 07/26/2017   Procedure: IMAGE GUIDED SINUS SURGERY;  Surgeon: Vernie Murders, MD;  Location: [facility];  Service: ENT;  Laterality: Bilateral;  GAVE DISK TO CECE 2-12   SEPTOPLASTY WITH ETHMOIDECTOMY, AND MAXILLARY ANTROSTOMY Bilateral 07/26/2017   Procedure: SEPTOPLASTY WITH TOTAL  ETHMOIDECTOMY, AND MAXILLARY ANTROSTOMYWITH REMOVAL OF TISSUE,;  Surgeon: Vernie Murders, MD;  Location: [facility];  Service: ENT;  Laterality: Bilateral;   TONSILLECTOMY     WISDOM TOOTH EXTRACTION       Family Psychiatric History: Please see initial evaluation for full details. I have reviewed the history. No updates at this time.     Family History:  Family History  Problem Relation Age of Onset   Endometriosis Mother    Bipolar disorder Mother    Alcohol abuse Mother    Heart disease Maternal Grandmother        valvular problem   Depression Maternal Grandmother    Breast cancer Neg Hx    Ovarian cancer Neg Hx    Colon cancer Neg Hx     Social History:  Social History   Socioeconomic History   Marital status: Married    Spouse name: Not on file   Number of children: Not on file   Years of education: Not on file   Highest education level: Not on file  Occupational History   Occupation: ED tech    Employer: [employer]  Tobacco Use   Smoking status: Never   Smokeless tobacco: Never  Vaping Use   Vaping Use: Never used  Substance and Sexual Activity   Alcohol use: Yes    Alcohol/week: 2.0 standard drinks of alcohol    Types: 2 Standard drinks or equivalent per week    Comment: Social   Drug use: No   Sexual activity: Yes    Partners: Male    Birth control/protection: Pill  Other Topics Concern   Not on file  Social History Narrative   Not on file   Social Determinants of Health   Financial Resource Strain: Not on file  Food Insecurity: Not on file  Transportation Needs: Not on file  Physical Activity: Not on file  Stress: Not on file  Social Connections: Not on file    Allergies:  Allergies  Allergen Reactions   Iodine Anaphylaxis    Contrast    Shellfish Allergy Hives   Contrast Media [Iodinated Contrast Media]    Flagyl [Metronidazole]     Metabolic Disorder Labs: Lab Results  Component Value Date   HGBA1C 5.2 08/10/2021   Lab Results  Component Value Date   PROLACTIN 35.8 (H) 03/14/2016   PROLACTIN 43.4 12/27/2007   Lab Results  Component Value Date   CHOL 293 (H) 08/10/2021   TRIG 92 08/10/2021   HDL 64 08/10/2021   CHOLHDL 4.6 (H)  08/10/2021   VLDL 17 09/25/2011   LDLCALC 214 (H) 08/10/2021   LDLCALC 178 (H) 03/26/2019   Lab Results  Component Value Date   TSH 1.040 09/05/2022   TSH 2.630 08/10/2021    Therapeutic Level Labs: No results found for: "LITHIUM" No results found for: "VALPROATE" No results found for: "CBMZ"  Current Medications: Current Outpatient Medications  Medication Sig Dispense Refill   albuterol (PROVENTIL HFA;VENTOLIN HFA) 108 (90 Base) MCG/ACT inhaler  Inhale 2 puffs into the lungs every 6 (six) hours as needed for wheezing or shortness of breath.     Drospirenone (SLYND) 4 MG TABS Take 1 tablet (4 mg total) by mouth daily. 84 tablet 3   Eflornithine HCl 13.9 % cream Apply 1 application  topically 2 (two) times daily. Apply a thin layer up to twice a day, at least 8 hours apart 45 g 6   EPINEPHrine 0.3 mg/0.3 mL IJ SOAJ injection as directed Injection prn 30 days 2 each 1   fluticasone (FLONASE) 50 MCG/ACT nasal spray 1 spray each nostril Nasally Once a day 30 days 16 g 3   gabapentin (NEURONTIN) 100 MG capsule Take 1 capsule (100 mg total) by mouth 2 (two) times daily for 7 days, THEN 2 capsules (200 mg total) 2 (two) times daily and continue this dose. 120 capsule 3   levocetirizine (XYZAL) 5 MG tablet Take 1 tablet (5 mg total) by mouth daily. 30 tablet 6   levothyroxine (SYNTHROID) 75 MCG tablet Take 1 tablet (75 mcg total) by mouth daily before breakfast. 30 tablet 11   montelukast (SINGULAIR) 10 MG tablet Take 10 mg by mouth as needed.     traZODone (DESYREL) 50 MG tablet Take 1/4 to 1/2 tablet (12.5 - 25 mg) by mouth at night for sleep. 30 tablet 11   venlafaxine XR (EFFEXOR-XR) 75 MG 24 hr capsule Take 3 capsules (225 mg total) by mouth daily with breakfast. 90 capsule 1   ARAZLO 0.045 % LOTN Apply topically. (Patient not taking: Reported on 10/19/2022)     azelastine (OPTIVAR) 0.05 % ophthalmic solution 1 drop into affected eye Ophthalmic Twice a day 30 days 6 mL 3   EPSOLAY 5 %  cream Apply 1 Application topically every morning.     mometasone (ELOCON) 0.1 % cream 1 application Externally Twice a day 30 days (Patient not taking: Reported on 10/19/2022) 90 g 1   nortriptyline (PAMELOR) 10 MG capsule Take 1 capsule (10 mg total) by mouth at bedtime for 1 week, then increase to 2 capsules (20mg ) at night thereafter (Patient not taking: Reported on 10/19/2022) 60 capsule 11   phentermine (ADIPEX-P) 37.5 MG tablet Take 1 tablet (37.5 mg total) by mouth daily before breakfast. (Patient not taking: Reported on 10/19/2022) 30 tablet 0   spironolactone (ALDACTONE) 100 MG tablet TAKE 2 TABLETS BY MOUTH DAILY (Patient not taking: Reported on 10/19/2022) 180 tablet 3   venlafaxine XR (EFFEXOR-XR) 75 MG 24 hr capsule Take 3 capsules (225 mg total) by mouth daily with breakfast. (Patient not taking: Reported on 10/19/2022) 90 capsule 2   Current Facility-Administered Medications  Medication Dose Route Frequency Provider Last Rate Last Admin   cyanocobalamin ((VITAMIN B-12)) injection 1,000 mcg  1,000 mcg Intramuscular Once Doreene Burke, CNM         Musculoskeletal: Strength & Muscle Tone: within normal limits Gait & Station: normal Patient leans: N/A  Psychiatric Specialty Exam: Review of Systems  Psychiatric/Behavioral:  Positive for decreased concentration and dysphoric mood. Negative for agitation, behavioral problems, confusion, hallucinations, self-injury, sleep disturbance and suicidal ideas. The patient is nervous/anxious. The patient is not hyperactive.   All other systems reviewed and are negative.   Blood pressure 130/86, pulse 76, temperature 97.6 F (36.4 C), temperature source Skin, height 4\' 11"  (1.499 m), weight 161 lb 12.8 oz (73.4 kg).Body mass index is 32.68 kg/m.  General Appearance: Fairly Groomed  Eye Contact:  Good  Speech:  Clear and Coherent  Volume:  Normal  Mood:   stressed  Affect:  Appropriate, Congruent, and down at times  Thought Process:   Coherent  Orientation:  Full (Time, Place, and Person)  Thought Content: Logical   Suicidal Thoughts:  No  Homicidal Thoughts:  No  Memory:  Immediate;   Good  Judgement:  Good  Insight:  Good  Psychomotor Activity:  Normal  Concentration:  Concentration: Good and Attention Span: Good  Recall:  Good  Fund of Knowledge: Good  Language: Good  Akathisia:  No  Handed:  Right  AIMS (if indicated): not done  Assets:  Communication Skills Desire for Improvement  ADL's:  Intact  Cognition: WNL  Sleep:  Good   Screenings: GAD-7    Flowsheet Row Office Visit from 06/16/2020 in [facility] Office Visit from 04/28/2020 in [facility]  Total GAD-7 Score 7 8      PHQ2-9    Flowsheet Row Office Visit from 11/09/2022 in [facility] Office Visit from 10/20/2021 in [facility] Video Visit from 07/27/2021 in [facility] Video Visit from 03/30/2021 in [facility] Video Visit from 01/26/2021 in [facility]  PHQ-2 Total Score 0 0 1 0 0  PHQ-9 Total Score -- 1 -- -- --      Flowsheet Row Video Visit from 03/30/2021 in [facility] Video Visit from 11/10/2020 in [facility] Video Visit from 10/06/2020 in [facility]  C-SSRS RISK CATEGORY No Risk No Risk No Risk        Assessment and Plan:  CORELLA DEMCHAK is a 42 y.o. year old female with a history of PTSD, depression, anxiety, GERD, hypothyroidism, PCOS, who presents for follow up appointment for below.   1. PTSD (post-traumatic stress disorder) 2. MDD (major depressive disorder), recurrent, in partial remission (HCC) 3. GAD (generalized anxiety disorder) Acute stressors include: conflict with her  daughter, occasional marital conflict, recent overdose of her mother, concern about her grandfather with memory loss  Other stressors include: childhood trauma, neck pain   History:    He has been overwhelmed with stressors as above, and symptoms secondary to ADHD.  Will continue current medication regimen at this time with the hope that intervention being low will be helpful for her mood.  Will continue venlafaxine to target depression, PTSD and anxiety. Noted that she has been prescribed nortriptyline by her neurologist.  Discussed potential risk of serotonin syndrome.   # ADHD  - neuropsych testing on 10/26/2022. Dx- ADHD, combined presentation, severe She struggles with ADHD symptoms, which has been affecting her function.  Will plan to start Concerta after UDS is reviewed.  Discussed potential risk of hypertension, worsening in anxiety, insomnia.    # Alcohol use Unchanged. She reports history of binge drinking.  She denies any craving for alcohol.  She is not interested in pharmacological treatment.  Will continue to assess this.    plan Continue venlafaxine 225 mg daily Continue Trazodone 12.5 mg at night as needed for insomnia Obtain UDS Plan to start Concerta 18 mg daily after reviewing UDS Next appointment: 8/2 at 8:30, video - on nortriptyline 40 mg at night, prescribed by neurologist    Past trials of medication: sertraline, fluoxetine (nightmares), L-theanine , Ambien, Trazodone   The patient demonstrates the following risk factors for suicide: Chronic risk factors  for suicide include: psychiatric disorder of depression, anxiety. Acute risk factors for suicide include: N/A. Protective factors for this patient include: positive social support, coping skills and hope for the future. Considering these factors, the overall suicide risk at this point appears to be low. Patient is appropriate for outpatient follow up.    Collaboration of Care: Collaboration of Care: Other reviewed  notes in Epic  Patient/Guardian was advised Release of Information must be obtained prior to any record release in order to collaborate their care with an outside provider. Patient/Guardian was advised if they have not already done so to contact the registration department to sign all necessary forms in order for [place] to release information regarding their care.   Consent: Patient/Guardian gives verbal consent for treatment and assignment of benefits for services provided during this visit. Patient/Guardian expressed understanding and agreed to proceed.    Neysa Hotter, MD 11/09/2022, 4:05 PM

## 2022-11-08 ENCOUNTER — Other Ambulatory Visit: Payer: Self-pay

## 2022-11-08 DIAGNOSIS — M5412 Radiculopathy, cervical region: Secondary | ICD-10-CM | POA: Diagnosis not present

## 2022-11-08 MED FILL — Levothyroxine Sodium Tab 75 MCG: ORAL | 30 days supply | Qty: 30 | Fill #4 | Status: AC

## 2022-11-09 ENCOUNTER — Encounter: Payer: Self-pay | Admitting: Psychiatry

## 2022-11-09 ENCOUNTER — Other Ambulatory Visit
Admission: RE | Admit: 2022-11-09 | Discharge: 2022-11-09 | Disposition: A | Discharge status: Home / Self Care | Payer: 59 | Source: Ambulatory Visit | Attending: Psychiatry | Admitting: Psychiatry

## 2022-11-09 ENCOUNTER — Ambulatory Visit (INDEPENDENT_AMBULATORY_CARE_PROVIDER_SITE_OTHER): Payer: 59 | Admitting: Psychiatry

## 2022-11-09 VITALS — BP 130/86 | HR 76 | Temp 97.6°F | Ht 59.0 in | Wt 161.8 lb

## 2022-11-09 DIAGNOSIS — F909 Attention-deficit hyperactivity disorder, unspecified type: Secondary | ICD-10-CM

## 2022-11-09 DIAGNOSIS — F4310 Post-traumatic stress disorder, unspecified: Secondary | ICD-10-CM

## 2022-11-09 DIAGNOSIS — F411 Generalized anxiety disorder: Secondary | ICD-10-CM | POA: Diagnosis not present

## 2022-11-09 DIAGNOSIS — F3341 Major depressive disorder, recurrent, in partial remission: Secondary | ICD-10-CM | POA: Diagnosis not present

## 2022-11-10 ENCOUNTER — Telehealth: Payer: Self-pay

## 2022-11-10 LAB — URINE DRUGS OF ABUSE SCREEN W ALC, ROUTINE (REF LAB)
Amphetamines, Urine: NEGATIVE ng/mL
Barbiturate, Ur: NEGATIVE ng/mL
Benzodiazepine Quant, Ur: NEGATIVE ng/mL
Cannabinoid Quant, Ur: NEGATIVE ng/mL
Cocaine (Metab.): NEGATIVE ng/mL
Ethanol U, Quan: NEGATIVE %
Methadone Screen, Urine: NEGATIVE ng/mL
Opiate Quant, Ur: NEGATIVE ng/mL
Phencyclidine, Ur: NEGATIVE ng/mL
Propoxyphene, Urine: NEGATIVE ng/mL

## 2022-11-10 NOTE — Telephone Encounter (Signed)
pt called she states that she had her labwork done and that she was asking about when new medication would be sent.

## 2022-11-10 NOTE — Telephone Encounter (Signed)
left message that labwork results were still pending and once the labs have been completed then medication would be sent.

## 2022-11-12 ENCOUNTER — Other Ambulatory Visit: Payer: Self-pay

## 2022-11-12 ENCOUNTER — Other Ambulatory Visit: Payer: Self-pay | Admitting: Psychiatry

## 2022-11-12 MED ORDER — METHYLPHENIDATE HCL ER (OSM) 18 MG PO TBCR
18.0000 mg | EXTENDED_RELEASE_TABLET | Freq: Every day | ORAL | 0 refills | Status: DC
  Filled 2022-11-12: qty 30, 30d supply, fill #0

## 2022-11-12 MED ORDER — METHYLPHENIDATE HCL ER (OSM) 18 MG PO TBCR: 18.0000 mg | EXTENDED_RELEASE_TABLET | Freq: Every day | ORAL | 0 refills | Status: DC

## 2022-11-12 NOTE — Telephone Encounter (Signed)
I have reviewed the labs, and they are all negative. Please inform the patient that Concerta 18 mg daily for ADHD has been sent to the pharmacy as discussed

## 2022-11-15 DIAGNOSIS — M5412 Radiculopathy, cervical region: Secondary | ICD-10-CM | POA: Diagnosis not present

## 2022-11-20 DIAGNOSIS — M5412 Radiculopathy, cervical region: Secondary | ICD-10-CM | POA: Diagnosis not present

## 2022-11-21 ENCOUNTER — Telehealth: Payer: Self-pay

## 2022-11-21 NOTE — Telephone Encounter (Addendum)
Could you advise her to take Concerta, two 18 mg capsules for a total of 36 mg daily? Advise her to continue this dose for a week to see how it helps her and let us know, unless she experiences any side effects.

## 2022-11-21 NOTE — Telephone Encounter (Signed)
pt left message that she does not feel any diffent. she doesn't think medication is doing anything. she still feel overstimulated and brain fog.  Pt was last seen on 6-13 next appt  8-2

## 2022-11-22 ENCOUNTER — Telehealth: Payer: Self-pay | Admitting: Psychiatry

## 2022-11-22 NOTE — Telephone Encounter (Signed)
Could you contact her to advise increasing the dose of Concerta (I just routed you the message)? Sorry for the delay. Although the message was communicated to the other nurse regarding her medication yesterday, she is sick, and this was not conveyed to the patient. I have not received any MyChart message from her, fyi.

## 2022-11-22 NOTE — Telephone Encounter (Signed)
Patient has been taking Concerta medication and states she is not seeing any results from medication and that medication is not working. Patient has been on medicine for the past 2 weeks and wants to know if medication needs to be altered. Patient states she has been messaging Dr.Hisada via mychart, but has had no luck with getting in contact with her. Patient was given our contact number and is aware to leave a voicemail message if necessary.-Please advise

## 2022-11-23 ENCOUNTER — Other Ambulatory Visit: Payer: Self-pay

## 2022-11-23 ENCOUNTER — Other Ambulatory Visit: Payer: Self-pay | Admitting: Psychiatry

## 2022-11-23 NOTE — Telephone Encounter (Signed)
left message with instructions

## 2022-11-24 ENCOUNTER — Other Ambulatory Visit: Payer: Self-pay

## 2022-11-27 NOTE — Telephone Encounter (Signed)
Called no answer left voicemail for patient to return call to office

## 2022-11-28 ENCOUNTER — Other Ambulatory Visit: Payer: Self-pay

## 2022-11-28 DIAGNOSIS — M5412 Radiculopathy, cervical region: Secondary | ICD-10-CM | POA: Diagnosis not present

## 2022-11-28 MED ORDER — METHYLPHENIDATE HCL ER 36 MG PO TB24
36.0000 mg | ORAL_TABLET | Freq: Every day | ORAL | 0 refills | Status: DC
  Filled 2022-11-29: qty 30, 30d supply, fill #0

## 2022-11-29 ENCOUNTER — Other Ambulatory Visit: Payer: Self-pay

## 2022-12-01 ENCOUNTER — Other Ambulatory Visit: Payer: Self-pay

## 2022-12-05 ENCOUNTER — Other Ambulatory Visit: Payer: Self-pay

## 2022-12-08 DIAGNOSIS — M5412 Radiculopathy, cervical region: Secondary | ICD-10-CM | POA: Diagnosis not present

## 2022-12-11 ENCOUNTER — Other Ambulatory Visit (HOSPITAL_COMMUNITY): Payer: Self-pay

## 2022-12-25 ENCOUNTER — Other Ambulatory Visit: Payer: Self-pay

## 2022-12-25 ENCOUNTER — Other Ambulatory Visit: Payer: Self-pay | Admitting: Psychiatry

## 2022-12-25 MED FILL — Levothyroxine Sodium Tab 75 MCG: ORAL | 30 days supply | Qty: 30 | Fill #5 | Status: AC

## 2022-12-25 NOTE — Progress Notes (Deleted)
BH MD/PA/NP OP Progress Note  12/25/2022 8:19 AM Paula [name]  MRN:  [id number]  Chief Complaint: No chief complaint on file.  HPI: ***   Concerta 36 mg daily   Visit Diagnosis: No diagnosis found.  Past Psychiatric History: Please see initial evaluation for full details. I have reviewed the history. No updates at this time.     Past Medical History:  Past Medical History:  Diagnosis Date   Anemia    Asthma    exercise induced   BULIMIA 01/17/2008   Annotation: AGE 42 Qualifier: Diagnosis of  By: Lorrene Reid LPN, Wanda     Complication of anesthesia    vomited during last c/s   CPD (cephalo-pelvic disproportion) 12/22/2014   Cystic fibrosis carrier in second trimester, antepartum 05/29/2014   Depression    Eczema    GERD (gastroesophageal reflux disease)    chronic   Headache    h/o migraines   Hypothyroidism    Nausea    Oligomenorrhea 12/22/2014   PCOS (polycystic ovarian syndrome)    PONV (postoperative nausea and vomiting)    Rhinitis, allergic     Past Surgical History:  Procedure Laterality Date   CESAREAN SECTION  04/19/2013   LTCS; CPD   CESAREAN SECTION N/A 10/27/2015   Procedure: REPEAT CESAREAN SECTION;  Surgeon: Herold Harms, MD;  Location: [facility];  Service: Obstetrics;  Laterality: N/A;   ENDOSCOPIC TURBINATE REDUCTION Bilateral 07/26/2017   Procedure: ENDOSCOPIC INFERIOR  TURBINATE REDUCTION;  Surgeon: Vernie Murders, MD;  Location: [facility];  Service: ENT;  Laterality: Bilateral;   FRONTAL SINUS EXPLORATION Bilateral 07/26/2017   Procedure: FRONTAL SINUS EXPLORATION;  Surgeon: Vernie Murders, MD;  Location: [facility];  Service: ENT;  Laterality: Bilateral;   IMAGE GUIDED SINUS SURGERY Bilateral 07/26/2017   Procedure: IMAGE GUIDED SINUS SURGERY;  Surgeon: Vernie Murders, MD;  Location: [facility];  Service: ENT;  Laterality: Bilateral;  GAVE DISK TO CECE 2-12   SEPTOPLASTY WITH ETHMOIDECTOMY, AND MAXILLARY  ANTROSTOMY Bilateral 07/26/2017   Procedure: SEPTOPLASTY WITH TOTAL  ETHMOIDECTOMY, AND MAXILLARY ANTROSTOMYWITH REMOVAL OF TISSUE,;  Surgeon: Vernie Murders, MD;  Location: [facility];  Service: ENT;  Laterality: Bilateral;   TONSILLECTOMY     WISDOM TOOTH EXTRACTION      Family Psychiatric History: Please see initial evaluation for full details. I have reviewed the history. No updates at this time.    Family History:  Family History  Problem Relation Age of Onset   Endometriosis Mother    Bipolar disorder Mother    Alcohol abuse Mother    Heart disease Maternal Grandmother        valvular problem   Depression Maternal Grandmother    Breast cancer Neg Hx    Ovarian cancer Neg Hx    Colon cancer Neg Hx     Social History:  Social History   Socioeconomic History   Marital status: Married    Spouse name: Not on file   Number of children: Not on file   Years of education: Not on file   Highest education level: Not on file  Occupational History   Occupation: ED tech    Employer: [employer]  Tobacco Use   Smoking status: Never   Smokeless tobacco: Never  Vaping Use   Vaping status: Never Used  Substance and Sexual Activity   Alcohol use: Yes    Alcohol/week: 2.0 standard drinks of alcohol    Types: 2 Standard drinks or equivalent per week  Comment: Social   Drug use: No   Sexual activity: Yes    Partners: Male    Birth control/protection: Pill  Other Topics Concern   Not on file  Social History Narrative   Not on file   Social Determinants of Health   Financial Resource Strain: Not on file  Food Insecurity: Not on file  Transportation Needs: Not on file  Physical Activity: Not on file  Stress: Not on file  Social Connections: Not on file    Allergies:  Allergies  Allergen Reactions   Iodine Anaphylaxis    Contrast    Shellfish Allergy Hives   Contrast Media [Iodinated Contrast Media]    Flagyl [Metronidazole]     Metabolic Disorder Labs: Lab  Results  Component Value Date   HGBA1C 5.2 08/10/2021   Lab Results  Component Value Date   PROLACTIN 35.8 (H) 03/14/2016   PROLACTIN 43.4 12/27/2007   Lab Results  Component Value Date   CHOL 293 (H) 08/10/2021   TRIG 92 08/10/2021   HDL 64 08/10/2021   CHOLHDL 4.6 (H) 08/10/2021   VLDL 17 09/25/2011   LDLCALC 214 (H) 08/10/2021   LDLCALC 178 (H) 03/26/2019   Lab Results  Component Value Date   TSH 1.040 09/05/2022   TSH 2.630 08/10/2021    Therapeutic Level Labs: No results found for: "LITHIUM" No results found for: "VALPROATE" No results found for: "CBMZ"  Current Medications: Current Outpatient Medications  Medication Sig Dispense Refill   methylphenidate (CONCERTA) 18 MG PO CR tablet Take 1 tablet (18 mg total) by mouth daily before breakfast. 30 tablet 0   albuterol (PROVENTIL HFA;VENTOLIN HFA) 108 (90 Base) MCG/ACT inhaler Inhale 2 puffs into the lungs every 6 (six) hours as needed for wheezing or shortness of breath.     ARAZLO 0.045 % LOTN Apply topically. (Patient not taking: Reported on 10/19/2022)     azelastine (OPTIVAR) 0.05 % ophthalmic solution 1 drop into affected eye Ophthalmic Twice a day 30 days 6 mL 3   Drospirenone (SLYND) 4 MG TABS Take 1 tablet (4 mg total) by mouth daily. 84 tablet 3   Eflornithine HCl 13.9 % cream Apply 1 application  topically 2 (two) times daily. Apply a thin layer up to twice a day, at least 8 hours apart 45 g 6   EPINEPHrine 0.3 mg/0.3 mL IJ SOAJ injection as directed Injection prn 30 days 2 each 1   EPSOLAY 5 % cream Apply 1 Application topically every morning.     fluticasone (FLONASE) 50 MCG/ACT nasal spray 1 spray each nostril Nasally Once a day 30 days 16 g 3   gabapentin (NEURONTIN) 100 MG capsule Take 1 capsule (100 mg total) by mouth 2 (two) times daily for 7 days, THEN 2 capsules (200 mg total) 2 (two) times daily and continue this dose. 120 capsule 3   levocetirizine (XYZAL) 5 MG tablet Take 1 tablet (5 mg total) by  mouth daily. 30 tablet 6   levothyroxine (SYNTHROID) 75 MCG tablet Take 1 tablet (75 mcg total) by mouth daily before breakfast. 30 tablet 11   methylphenidate 36 MG PO CR tablet Take 1 tablet (36 mg total) by mouth daily. 30 tablet 0   mometasone (ELOCON) 0.1 % cream 1 application Externally Twice a day 30 days (Patient not taking: Reported on 10/19/2022) 90 g 1   montelukast (SINGULAIR) 10 MG tablet Take 10 mg by mouth as needed.     nortriptyline (PAMELOR) 10 MG capsule Take 1  capsule (10 mg total) by mouth at bedtime for 1 week, then increase to 2 capsules (20mg ) at night thereafter (Patient not taking: Reported on 10/19/2022) 60 capsule 11   phentermine (ADIPEX-P) 37.5 MG tablet Take 1 tablet (37.5 mg total) by mouth daily before breakfast. (Patient not taking: Reported on 10/19/2022) 30 tablet 0   spironolactone (ALDACTONE) 100 MG tablet TAKE 2 TABLETS BY MOUTH DAILY (Patient not taking: Reported on 10/19/2022) 180 tablet 3   traZODone (DESYREL) 50 MG tablet Take 1/4 to 1/2 tablet (12.5 - 25 mg) by mouth at night for sleep. 30 tablet 11   venlafaxine XR (EFFEXOR-XR) 75 MG 24 hr capsule Take 3 capsules (225 mg total) by mouth daily with breakfast. (Patient not taking: Reported on 10/19/2022) 90 capsule 2   venlafaxine XR (EFFEXOR-XR) 75 MG 24 hr capsule Take 3 capsules (225 mg total) by mouth daily with breakfast. 90 capsule 1   Current Facility-Administered Medications  Medication Dose Route Frequency Provider Last Rate Last Admin   cyanocobalamin ((VITAMIN B-12)) injection 1,000 mcg  1,000 mcg Intramuscular Once Doreene Burke, CNM         Musculoskeletal: Strength & Muscle Tone:  N/A Gait & Station:  N/A Patient leans: N/A  Psychiatric Specialty Exam: Review of Systems  There were no vitals taken for this visit.There is no height or weight on file to calculate BMI.  General Appearance: {Appearance:22683}  Eye Contact:  {B[facility] EYE CONTACT:22684}  Speech:  Clear and Coherent  Volume:   Normal  Mood:  {BHH MOOD:22306}  Affect:  {Affect (PAA):22687}  Thought Process:  Coherent  Orientation:  Full (Time, Place, and Person)  Thought Content: Logical   Suicidal Thoughts:  {ST/HT (PAA):22692}  Homicidal Thoughts:  {ST/HT (PAA):22692}  Memory:  Immediate;   Good  Judgement:  {Judgement (PAA):22694}  Insight:  {Insight (PAA):22695}  Psychomotor Activity:  Normal  Concentration:  Concentration: Good and Attention Span: Good  Recall:  Good  Fund of Knowledge: Good  Language: Good  Akathisia:  No  Handed:  Right  AIMS (if indicated): not done  Assets:  Communication Skills Desire for Improvement  ADL's:  Intact  Cognition: WNL  Sleep:  {B[facility] GOOD/FAIR/POOR:22877}   Screenings: GAD-7    Flowsheet Row Office Visit from 06/16/2020 in [facility] Office Visit from 04/28/2020 in [facility]  Total GAD-7 Score 7 8      PHQ2-9    Flowsheet Row Office Visit from 11/09/2022 in [facility] Office Visit from 10/20/2021 in [facility] Video Visit from 07/27/2021 in [facility] Video Visit from 03/30/2021 in [facility] Video Visit from 01/26/2021 in [facility]  PHQ-2 Total Score 0 0 1 0 0  PHQ-9 Total Score -- 1 -- -- --      Flowsheet Row Video Visit from 03/30/2021 in [facility] Video Visit from 11/10/2020 in [facility] Video Visit from 10/06/2020 in [facility]  C-SSRS RISK CATEGORY No Risk No Risk No Risk        Assessment and Plan:  ANNUM BRAUTIGAM is a 42 y.o. year old female with a history of PTSD, depression, anxiety, GERD, hypothyroidism, PCOS, who presents for follow up appointment for below.    1. PTSD (post-traumatic  stress disorder) 2. MDD (major depressive disorder), recurrent, in partial remission (HCC) 3.  GAD (generalized anxiety disorder) Acute stressors include: conflict with her daughter, occasional marital conflict, recent overdose of her mother, concern about her grandfather with memory loss  Other stressors include: childhood trauma, neck pain   History:    He has been overwhelmed with stressors as above, and symptoms secondary to ADHD.  Will continue current medication regimen at this time with the hope that intervention being low will be helpful for her mood.  Will continue venlafaxine to target depression, PTSD and anxiety. Noted that she has been prescribed nortriptyline by her neurologist.  Discussed potential risk of serotonin syndrome.    # ADHD  - neuropsych testing on 10/26/2022. Dx- ADHD, combined presentation, severe She struggles with ADHD symptoms, which has been affecting her function.  Will plan to start Concerta after UDS is reviewed.  Discussed potential risk of hypertension, worsening in anxiety, insomnia.      # Alcohol use Unchanged. She reports history of binge drinking.  She denies any craving for alcohol.  She is not interested in pharmacological treatment.  Will continue to assess this.    plan Continue venlafaxine 225 mg daily Continue Trazodone 12.5 mg at night as needed for insomnia Obtain UDS Plan to start Concerta 18 mg daily after reviewing UDS Next appointment: 8/2 at 8:30, video - on nortriptyline 40 mg at night, prescribed by neurologist     Past trials of medication: sertraline, fluoxetine (nightmares), L-theanine , Ambien, Trazodone   The patient demonstrates the following risk factors for suicide: Chronic risk factors for suicide include: psychiatric disorder of depression, anxiety. Acute risk factors for suicide include: N/A. Protective factors for this patient include: positive social support, coping skills and hope for the future. Considering these  factors, the overall suicide risk at this point appears to be low. Patient is appropriate for outpatient follow up.    Collaboration of Care: Collaboration of Care: {BH OP Collaboration of Care:21014065}  Patient/Guardian was advised Release of Information must be obtained prior to any record release in order to collaborate their care with an outside provider. Patient/Guardian was advised if they have not already done so to contact the registration department to sign all necessary forms in order for [place] to release information regarding their care.   Consent: Patient/Guardian gives verbal consent for treatment and assignment of benefits for services provided during this visit. Patient/Guardian expressed understanding and agreed to proceed.    Neysa Hotter, MD 12/25/2022, 8:19 AM

## 2022-12-26 ENCOUNTER — Other Ambulatory Visit: Payer: Self-pay

## 2022-12-26 ENCOUNTER — Other Ambulatory Visit: Payer: Self-pay | Admitting: Psychiatry

## 2022-12-26 MED ORDER — VENLAFAXINE HCL ER 75 MG PO CP24
225.0000 mg | ORAL_CAPSULE | Freq: Every day | ORAL | 1 refills | Status: DC
  Filled 2022-12-26: qty 270, 90d supply, fill #0
  Filled 2023-03-26: qty 270, 90d supply, fill #1
  Filled 2023-03-27 (×2): qty 90, 30d supply, fill #1
  Filled 2023-03-27: qty 7, 3d supply, fill #1
  Filled 2023-03-27: qty 263, 87d supply, fill #1

## 2022-12-28 ENCOUNTER — Other Ambulatory Visit: Payer: Self-pay

## 2022-12-29 ENCOUNTER — Other Ambulatory Visit: Payer: Self-pay

## 2022-12-29 ENCOUNTER — Telehealth: Payer: 59 | Admitting: Psychiatry

## 2022-12-31 ENCOUNTER — Other Ambulatory Visit: Payer: Self-pay

## 2023-01-01 ENCOUNTER — Other Ambulatory Visit: Payer: Self-pay

## 2023-01-01 ENCOUNTER — Telehealth: Payer: 59 | Admitting: Psychiatry

## 2023-01-01 ENCOUNTER — Encounter: Payer: Self-pay | Admitting: Psychiatry

## 2023-01-01 DIAGNOSIS — F4310 Post-traumatic stress disorder, unspecified: Secondary | ICD-10-CM | POA: Diagnosis not present

## 2023-01-01 DIAGNOSIS — F411 Generalized anxiety disorder: Secondary | ICD-10-CM

## 2023-01-01 DIAGNOSIS — F3341 Major depressive disorder, recurrent, in partial remission: Secondary | ICD-10-CM

## 2023-01-01 DIAGNOSIS — F909 Attention-deficit hyperactivity disorder, unspecified type: Secondary | ICD-10-CM | POA: Diagnosis not present

## 2023-01-01 MED ORDER — METHYLPHENIDATE HCL ER (OSM) 36 MG PO TBCR
36.0000 mg | EXTENDED_RELEASE_TABLET | Freq: Every day | ORAL | 0 refills | Status: DC
  Filled 2023-01-04: qty 30, 30d supply, fill #0
  Filled ????-??-??: fill #0

## 2023-01-01 MED ORDER — METHYLPHENIDATE HCL 10 MG PO TABS
10.0000 mg | ORAL_TABLET | Freq: Every day | ORAL | 0 refills | Status: DC
  Filled 2023-01-01: qty 30, 30d supply, fill #0

## 2023-01-01 MED ORDER — METHYLPHENIDATE HCL 10 MG PO TABS
10.0000 mg | ORAL_TABLET | Freq: Every day | ORAL | 0 refills | Status: DC
  Filled 2023-02-07 – 2023-02-21 (×2): qty 30, 30d supply, fill #0

## 2023-01-01 MED ORDER — METHYLPHENIDATE HCL ER (OSM) 36 MG PO TBCR: 36.0000 mg | EXTENDED_RELEASE_TABLET | Freq: Every day | ORAL | 0 refills | Status: DC

## 2023-01-01 NOTE — Progress Notes (Signed)
Virtual Visit via Video Note  I connected with Paula [name] on 01/01/23 at  4:00 PM EDT by a video enabled telemedicine application and verified that I am speaking with the correct person using two identifiers.  Location: Patient: home Provider: office Persons participated in the visit- patient, provider    I discussed the limitations of evaluation and management by telemedicine and the availability of in person appointments. The patient expressed understanding and agreed to proceed.   I discussed the assessment and treatment plan with the patient. The patient was provided an opportunity to ask questions and all were answered. The patient agreed with the plan and demonstrated an understanding of the instructions.   The patient was advised to call back or seek an in-person evaluation if the symptoms worsen or if the condition fails to improve as anticipated.  I provided 25 minutes of non-face-to-face time during this encounter.   Paula Hotter, MD     [facility] MD/PA/NP OP Progress Note  01/01/2023 4:35 PM Paula [name]  MRN:  [id number]  Chief Complaint:  Chief Complaint  Patient presents with   Follow-up   HPI:  This is a follow-up appointment for depression, anxiety, PTSD and ADHD.  She states that she has been doing good.  She feels very motivated and has been able to get things done since starting Concerta.  She does not feel overstimulated.  However, she notices that the medication wears off around 2 PM.  She takes it and a 5 AM, and starts work at 730.  She does not think she can take the medication later in the day as she is very busy at work.  She thinks she has been feeling more gentle.  She misses her kids when they are not around her.  She states that one of her daughter is enjoying horseback riding.  Another daughter is doing carate.  She thinks that all of them has been working on things that in the summer, and it has been going very well.  She states that it is very  beautiful to see connection, referring to her childhood of not receiving growing up.  She sleeps well with current dose of trazodone.  She denies feeling depressed or anxiety.  She denies SI.  She is willing to try the middle dose of Ritalin at this time.   Substance use   Tobacco Alcohol Other substances/  Current denies 2-3 Glasses of [place] per week denies  Past denies binge drinking, a bottle of wine , last in Jan 2024  denies  Past Treatment           Visit Diagnosis:    ICD-10-CM   1. PTSD (post-traumatic stress disorder)  F43.10     2. MDD (major depressive disorder), recurrent, in partial remission (HCC)  F33.41     3. GAD (generalized anxiety disorder)  F41.1     4. Attention deficit hyperactivity disorder (ADHD), unspecified ADHD type  F90.9       Past Psychiatric History: Please see initial evaluation for full details. I have reviewed the history. No updates at this time.     Past Medical History:  Past Medical History:  Diagnosis Date   Anemia    Asthma    exercise induced   BULIMIA 01/17/2008   Annotation: AGE 75 Qualifier: Diagnosis of  By: Lorrene Reid LPN, Wanda     Complication of anesthesia    vomited during last c/s   CPD (cephalo-pelvic disproportion) 12/22/2014   Cystic fibrosis  carrier in second trimester, antepartum 05/29/2014   Depression    Eczema    GERD (gastroesophageal reflux disease)    chronic   Headache    h/o migraines   Hypothyroidism    Nausea    Oligomenorrhea 12/22/2014   PCOS (polycystic ovarian syndrome)    PONV (postoperative nausea and vomiting)    Rhinitis, allergic     Past Surgical History:  Procedure Laterality Date   CESAREAN SECTION  04/19/2013   LTCS; CPD   CESAREAN SECTION N/A 10/27/2015   Procedure: REPEAT CESAREAN SECTION;  Surgeon: Herold Harms, MD;  Location: [facility];  Service: Obstetrics;  Laterality: N/A;   ENDOSCOPIC TURBINATE REDUCTION Bilateral 07/26/2017   Procedure: ENDOSCOPIC INFERIOR  TURBINATE  REDUCTION;  Surgeon: Vernie Murders, MD;  Location: [facility];  Service: ENT;  Laterality: Bilateral;   FRONTAL SINUS EXPLORATION Bilateral 07/26/2017   Procedure: FRONTAL SINUS EXPLORATION;  Surgeon: Vernie Murders, MD;  Location: [facility];  Service: ENT;  Laterality: Bilateral;   IMAGE GUIDED SINUS SURGERY Bilateral 07/26/2017   Procedure: IMAGE GUIDED SINUS SURGERY;  Surgeon: Vernie Murders, MD;  Location: [facility];  Service: ENT;  Laterality: Bilateral;  GAVE DISK TO CECE 2-12   SEPTOPLASTY WITH ETHMOIDECTOMY, AND MAXILLARY ANTROSTOMY Bilateral 07/26/2017   Procedure: SEPTOPLASTY WITH TOTAL  ETHMOIDECTOMY, AND MAXILLARY ANTROSTOMYWITH REMOVAL OF TISSUE,;  Surgeon: Vernie Murders, MD;  Location: [facility];  Service: ENT;  Laterality: Bilateral;   TONSILLECTOMY     WISDOM TOOTH EXTRACTION      Family Psychiatric History: Please see initial evaluation for full details. I have reviewed the history. No updates at this time.     Family History:  Family History  Problem Relation Age of Onset   Endometriosis Mother    Bipolar disorder Mother    Alcohol abuse Mother    Heart disease Maternal Grandmother        valvular problem   Depression Maternal Grandmother    Breast cancer Neg Hx    Ovarian cancer Neg Hx    Colon cancer Neg Hx     Social History:  Social History   Socioeconomic History   Marital status: Married    Spouse name: Not on file   Number of children: Not on file   Years of education: Not on file   Highest education level: Not on file  Occupational History   Occupation: ED tech    Employer: [employer]  Tobacco Use   Smoking status: Never   Smokeless tobacco: Never  Vaping Use   Vaping status: Never Used  Substance and Sexual Activity   Alcohol use: Yes    Alcohol/week: 2.0 standard drinks of alcohol    Types: 2 Standard drinks or equivalent per week    Comment: Social   Drug use: No   Sexual activity: Yes    Partners: Male     Birth control/protection: Pill  Other Topics Concern   Not on file  Social History Narrative   Not on file   Social Determinants of Health   Financial Resource Strain: Not on file  Food Insecurity: Not on file  Transportation Needs: Not on file  Physical Activity: Not on file  Stress: Not on file  Social Connections: Not on file    Allergies:  Allergies  Allergen Reactions   Iodine Anaphylaxis    Contrast    Shellfish Allergy Hives   Contrast Media [Iodinated Contrast Media]    Flagyl [Metronidazole]  Metabolic Disorder Labs: Lab Results  Component Value Date   HGBA1C 5.2 08/10/2021   Lab Results  Component Value Date   PROLACTIN 35.8 (H) 03/14/2016   PROLACTIN 43.4 12/27/2007   Lab Results  Component Value Date   CHOL 293 (H) 08/10/2021   TRIG 92 08/10/2021   HDL 64 08/10/2021   CHOLHDL 4.6 (H) 08/10/2021   VLDL 17 09/25/2011   LDLCALC 214 (H) 08/10/2021   LDLCALC 178 (H) 03/26/2019   Lab Results  Component Value Date   TSH 1.040 09/05/2022   TSH 2.630 08/10/2021    Therapeutic Level Labs: No results found for: "LITHIUM" No results found for: "VALPROATE" No results found for: "CBMZ"  Current Medications: Current Outpatient Medications  Medication Sig Dispense Refill   [START ON 01/04/2023] methylphenidate (CONCERTA) 36 MG PO CR tablet Take 1 tablet (36 mg total) by mouth daily before breakfast. 30 tablet 0   [START ON 02/03/2023] methylphenidate (CONCERTA) 36 MG PO CR tablet Take 1 tablet (36 mg total) by mouth daily before breakfast. 30 tablet 0   methylphenidate (RITALIN) 10 MG tablet Take 1 tablet (10 mg total) by mouth daily at 12 noon. 30 tablet 0   [START ON 01/31/2023] methylphenidate (RITALIN) 10 MG tablet Take 1 tablet (10 mg total) by mouth daily at 12 noon. 30 tablet 0   albuterol (PROVENTIL HFA;VENTOLIN HFA) 108 (90 Base) MCG/ACT inhaler Inhale 2 puffs into the lungs every 6 (six) hours as needed for wheezing or shortness of breath.      ARAZLO 0.045 % LOTN Apply topically. (Patient not taking: Reported on 10/19/2022)     azelastine (OPTIVAR) 0.05 % ophthalmic solution 1 drop into affected eye Ophthalmic Twice a day 30 days 6 mL 3   Drospirenone (SLYND) 4 MG TABS Take 1 tablet (4 mg total) by mouth daily. 84 tablet 3   Eflornithine HCl 13.9 % cream Apply 1 application  topically 2 (two) times daily. Apply a thin layer up to twice a day, at least 8 hours apart 45 g 6   EPINEPHrine 0.3 mg/0.3 mL IJ SOAJ injection as directed Injection prn 30 days 2 each 1   EPSOLAY 5 % cream Apply 1 Application topically every morning.     fluticasone (FLONASE) 50 MCG/ACT nasal spray 1 spray each nostril Nasally Once a day 30 days 16 g 3   gabapentin (NEURONTIN) 100 MG capsule Take 1 capsule (100 mg total) by mouth 2 (two) times daily for 7 days, THEN 2 capsules (200 mg total) 2 (two) times daily and continue this dose. 120 capsule 3   levocetirizine (XYZAL) 5 MG tablet Take 1 tablet (5 mg total) by mouth daily. 30 tablet 6   levothyroxine (SYNTHROID) 75 MCG tablet Take 1 tablet (75 mcg total) by mouth daily before breakfast. 30 tablet 11   methylphenidate 36 MG PO CR tablet Take 1 tablet (36 mg total) by mouth daily. 30 tablet 0   mometasone (ELOCON) 0.1 % cream 1 application Externally Twice a day 30 days (Patient not taking: Reported on 10/19/2022) 90 g 1   montelukast (SINGULAIR) 10 MG tablet Take 10 mg by mouth as needed.     spironolactone (ALDACTONE) 100 MG tablet TAKE 2 TABLETS BY MOUTH DAILY (Patient not taking: Reported on 10/19/2022) 180 tablet 3   traZODone (DESYREL) 50 MG tablet Take 1/4 to 1/2 tablet (12.5 - 25 mg) by mouth at night for sleep. 30 tablet 11   venlafaxine XR (EFFEXOR-XR) 75 MG 24 hr capsule  Take 3 capsules (225 mg total) by mouth daily with breakfast. 270 capsule 1   Current Facility-Administered Medications  Medication Dose Route Frequency Provider Last Rate Last Admin   cyanocobalamin ((VITAMIN B-12)) injection 1,000 mcg   1,000 mcg Intramuscular Once Doreene Burke, CNM         Musculoskeletal: Strength & Muscle Tone:  N/A Gait & Station:  N/A Patient leans: N/A  Psychiatric Specialty Exam: Review of Systems  There were no vitals taken for this visit.There is no height or weight on file to calculate BMI.  General Appearance: Fairly Groomed  Eye Contact:  Good  Speech:  Clear and Coherent  Volume:  Normal  Mood:   good  Affect:  Appropriate, Congruent, and Full Range  Thought Process:  Coherent  Orientation:  Full (Time, Place, and Person)  Thought Content: Logical   Suicidal Thoughts:  No  Homicidal Thoughts:  No  Memory:  Immediate;   Good  Judgement:  Good  Insight:  Good  Psychomotor Activity:  Normal  Concentration:  Concentration: Good and Attention Span: Good  Recall:  Good  Fund of Knowledge: Good  Language: Good  Akathisia:  No  Handed:  Right  AIMS (if indicated): not done  Assets:  Communication Skills Desire for Improvement  ADL's:  Intact  Cognition: WNL  Sleep:  Good   Screenings: GAD-7    Flowsheet Row Office Visit from 06/16/2020 in [facility] Office Visit from 04/28/2020 in [facility]  Total GAD-7 Score 7 8      PHQ2-9    Flowsheet Row Office Visit from 11/09/2022 in [facility] Office Visit from 10/20/2021 in [facility] Video Visit from 07/27/2021 in [facility] Video Visit from 03/30/2021 in [facility] Video Visit from 01/26/2021 in [facility]  PHQ-2 Total Score 0 0 1 0 0  PHQ-9 Total Score -- 1 -- -- --      Flowsheet Row Video Visit from 03/30/2021 in [facility] Video Visit from 11/10/2020 in [facility] Video Visit from 10/06/2020 in [facility]  C-SSRS RISK CATEGORY No Risk No Risk No Risk        Assessment and Plan:  Paula [name] is a 42 y.o. year old female with a history of PTSD, depression, anxiety, GERD, hypothyroidism, PCOS, who presents for follow up appointment for below.   1. PTSD (post-traumatic stress disorder) 2. MDD (major depressive disorder), recurrent, in partial remission (HCC) 3. GAD (generalized anxiety disorder) Acute stressors include: conflict with her daughter, occasional marital conflict, recent overdose of her mother, concern about her grandfather with memory loss  Other stressors include: childhood trauma, neck pain   History:    There has been steady improvement in depressive symptoms and anxiety since intervention being low for ADHD.  Will continue current dose of venlafaxine to target depression, PTSD and anxiety.   4. Attention deficit hyperactivity disorder (ADHD), unspecified ADHD type - neuropsych testing on 10/26/2022. Dx- ADHD, combined presentation, severe. UDS negative  10/2022 Significant improvement since starting Concerta.  Will add low-dose of Ritalin later in the day to address wearing off.  Discussed potential risk of hypertension, palpitation, anxiety and insomnia.    # Alcohol use Unchanged. She reports history of binge drinking.  She denies any craving for alcohol.  She is not  interested in pharmacological treatment.  Will continue to assess this.    plan Continue venlafaxine 225 mg daily Continue Trazodone 12.5 mg at night as needed for insomnia- drowsiness from higher dose Continue Concerta 36 mg daily  Start Ritalin 10 mg at noon Next appointment: 9/30 at 1 pm, IP     Past trials of medication: sertraline, fluoxetine (nightmares), L-theanine , Ambien, Trazodone   The patient demonstrates the following risk factors for suicide: Chronic risk factors for suicide include: psychiatric disorder of depression, anxiety. Acute risk factors for  suicide include: N/A. Protective factors for this patient include: positive social support, coping skills and hope for the future. Considering these factors, the overall suicide risk at this point appears to be low. Patient is appropriate for outpatient follow up.  Collaboration of Care: Collaboration of Care: Other reviewed notes in Epic  Patient/Guardian was advised Release of Information must be obtained prior to any record release in order to collaborate their care with an outside provider. Patient/Guardian was advised if they have not already done so to contact the registration department to sign all necessary forms in order for [place] to release information regarding their care.   Consent: Patient/Guardian gives verbal consent for treatment and assignment of benefits for services provided during this visit. Patient/Guardian expressed understanding and agreed to proceed.    Paula Hotter, MD 01/01/2023, 4:35 PM

## 2023-01-01 NOTE — Patient Instructions (Signed)
Continue venlafaxine 225 mg daily Continue Trazodone 12.5 mg at night as needed for insomnia Continue Concerta 36 mg daily  Start Ritalin 10 mg at noon Next appointment: 9/30 at 1 pm

## 2023-01-02 ENCOUNTER — Other Ambulatory Visit: Payer: Self-pay

## 2023-01-03 ENCOUNTER — Other Ambulatory Visit: Payer: Self-pay

## 2023-01-03 MED ORDER — LEVOCETIRIZINE DIHYDROCHLORIDE 5 MG PO TABS
5.0000 mg | ORAL_TABLET | Freq: Every day | ORAL | 3 refills | Status: AC
  Filled 2023-01-03 – 2023-01-24 (×2): qty 30, 30d supply, fill #0
  Filled 2023-02-28: qty 30, 30d supply, fill #1
  Filled 2023-05-28: qty 30, 30d supply, fill #2

## 2023-01-04 ENCOUNTER — Other Ambulatory Visit: Payer: Self-pay

## 2023-01-04 ENCOUNTER — Other Ambulatory Visit: Payer: Self-pay | Admitting: Psychiatry

## 2023-01-04 MED ORDER — METHYLPHENIDATE HCL ER 36 MG PO TB24: 36.0000 mg | ORAL_TABLET | Freq: Every day | ORAL | 0 refills | Status: DC

## 2023-01-05 ENCOUNTER — Ambulatory Visit
Admission: RE | Admit: 2023-01-05 | Discharge: 2023-01-05 | Disposition: A | Discharge status: Home / Self Care | Payer: 59 | Source: Ambulatory Visit | Attending: Obstetrics | Admitting: Obstetrics

## 2023-01-05 ENCOUNTER — Other Ambulatory Visit: Payer: Self-pay | Admitting: Certified Nurse Midwife

## 2023-01-05 DIAGNOSIS — R5382 Chronic fatigue, unspecified: Secondary | ICD-10-CM

## 2023-01-05 DIAGNOSIS — Z1231 Encounter for screening mammogram for malignant neoplasm of breast: Secondary | ICD-10-CM

## 2023-01-05 DIAGNOSIS — L680 Hirsutism: Secondary | ICD-10-CM

## 2023-01-05 DIAGNOSIS — Z01419 Encounter for gynecological examination (general) (routine) without abnormal findings: Secondary | ICD-10-CM

## 2023-01-05 DIAGNOSIS — E282 Polycystic ovarian syndrome: Secondary | ICD-10-CM

## 2023-01-22 ENCOUNTER — Encounter: Payer: Self-pay | Admitting: Certified Nurse Midwife

## 2023-01-23 ENCOUNTER — Other Ambulatory Visit: Payer: Self-pay

## 2023-01-23 ENCOUNTER — Other Ambulatory Visit: Payer: 59

## 2023-01-23 ENCOUNTER — Other Ambulatory Visit: Payer: Self-pay | Admitting: Certified Nurse Midwife

## 2023-01-23 DIAGNOSIS — Z1329 Encounter for screening for other suspected endocrine disorder: Secondary | ICD-10-CM

## 2023-01-23 DIAGNOSIS — Z01419 Encounter for gynecological examination (general) (routine) without abnormal findings: Secondary | ICD-10-CM

## 2023-01-23 DIAGNOSIS — R5382 Chronic fatigue, unspecified: Secondary | ICD-10-CM

## 2023-01-23 DIAGNOSIS — Z13220 Encounter for screening for lipoid disorders: Secondary | ICD-10-CM

## 2023-01-23 DIAGNOSIS — Z130 Encounter for screening for diseases of the blood and blood-forming organs and certain disorders involving the immune mechanism: Secondary | ICD-10-CM

## 2023-01-24 ENCOUNTER — Other Ambulatory Visit: Payer: Self-pay

## 2023-01-24 ENCOUNTER — Other Ambulatory Visit: Payer: Self-pay | Admitting: Psychiatry

## 2023-01-24 MED FILL — Levothyroxine Sodium Tab 75 MCG: ORAL | 30 days supply | Qty: 30 | Fill #6 | Status: AC

## 2023-01-25 ENCOUNTER — Other Ambulatory Visit: Payer: 59

## 2023-01-25 DIAGNOSIS — Z1329 Encounter for screening for other suspected endocrine disorder: Secondary | ICD-10-CM | POA: Diagnosis not present

## 2023-01-25 DIAGNOSIS — Z130 Encounter for screening for diseases of the blood and blood-forming organs and certain disorders involving the immune mechanism: Secondary | ICD-10-CM | POA: Diagnosis not present

## 2023-01-25 DIAGNOSIS — Z13220 Encounter for screening for lipoid disorders: Secondary | ICD-10-CM | POA: Diagnosis not present

## 2023-01-25 DIAGNOSIS — Z01419 Encounter for gynecological examination (general) (routine) without abnormal findings: Secondary | ICD-10-CM | POA: Diagnosis not present

## 2023-01-25 DIAGNOSIS — R5382 Chronic fatigue, unspecified: Secondary | ICD-10-CM | POA: Diagnosis not present

## 2023-01-26 LAB — COMPREHENSIVE METABOLIC PANEL
ALT: 59 IU/L — ABNORMAL HIGH (ref 0–32)
AST: 37 IU/L (ref 0–40)
Albumin: 4.7 g/dL (ref 3.9–4.9)
Alkaline Phosphatase: 71 IU/L (ref 44–121)
BUN/Creatinine Ratio: 16 (ref 9–23)
BUN: 14 mg/dL (ref 6–24)
Bilirubin Total: 0.9 mg/dL (ref 0.0–1.2)
CO2: 26 mmol/L (ref 20–29)
Calcium: 9.9 mg/dL (ref 8.7–10.2)
Chloride: 102 mmol/L (ref 96–106)
Creatinine, Ser: 0.87 mg/dL (ref 0.57–1.00)
Globulin, Total: 2.5 g/dL (ref 1.5–4.5)
Glucose: 86 mg/dL (ref 70–99)
Potassium: 4.7 mmol/L (ref 3.5–5.2)
Sodium: 139 mmol/L (ref 134–144)
Total Protein: 7.2 g/dL (ref 6.0–8.5)
eGFR: 86 mL/min/{1.73_m2} (ref >59)

## 2023-01-26 LAB — CBC
Hematocrit: 44.4 % (ref 34.0–46.6)
Hemoglobin: 14.5 g/dL (ref 11.1–15.9)
MCH: 29.8 pg (ref 26.6–33.0)
MCHC: 32.7 g/dL (ref 31.5–35.7)
MCV: 91 fL (ref 79–97)
Platelets: 288 10*3/uL (ref 150–450)
RBC: 4.86 x10E6/uL (ref 3.77–5.28)
RDW: 11.9 % (ref 11.7–15.4)
WBC: 5 10*3/uL (ref 3.4–10.8)

## 2023-01-26 LAB — VITAMIN B12: Vitamin B-12: 1161 pg/mL (ref 232–1245)

## 2023-01-26 LAB — VITAMIN D 25 HYDROXY (VIT D DEFICIENCY, FRACTURES): Vit D, 25-Hydroxy: 42.3 ng/mL (ref 30.0–100.0)

## 2023-01-26 LAB — LIPID PANEL
Chol/HDL Ratio: 5.4 ratio — ABNORMAL HIGH (ref 0.0–4.4)
Cholesterol, Total: 295 mg/dL — ABNORMAL HIGH (ref 100–199)
HDL: 55 mg/dL (ref >39)
LDL Chol Calc (NIH): 228 mg/dL — ABNORMAL HIGH (ref 0–99)
Triglycerides: 74 mg/dL (ref 0–149)
VLDL Cholesterol Cal: 12 mg/dL (ref 5–40)

## 2023-01-26 LAB — TSH+FREE T4
Free T4: 1.21 ng/dL (ref 0.82–1.77)
TSH: 1.85 u[IU]/mL (ref 0.450–4.500)

## 2023-01-26 LAB — HEMOGLOBIN A1C
Est. average glucose Bld gHb Est-mCnc: 100 mg/dL
Hgb A1c MFr Bld: 5.1 % (ref 4.8–5.6)

## 2023-01-29 ENCOUNTER — Encounter: Payer: Self-pay | Admitting: Certified Nurse Midwife

## 2023-02-02 ENCOUNTER — Encounter (INDEPENDENT_AMBULATORY_CARE_PROVIDER_SITE_OTHER): Payer: 59

## 2023-02-02 DIAGNOSIS — F909 Attention-deficit hyperactivity disorder, unspecified type: Secondary | ICD-10-CM | POA: Diagnosis not present

## 2023-02-05 ENCOUNTER — Other Ambulatory Visit: Payer: Self-pay

## 2023-02-05 MED ORDER — DEXMETHYLPHENIDATE HCL ER 15 MG PO CP24
15.0000 mg | ORAL_CAPSULE | Freq: Every day | ORAL | 0 refills | Status: DC
  Filled 2023-02-05: qty 30, 30d supply, fill #0

## 2023-02-05 NOTE — Telephone Encounter (Signed)
I've spent a total time of 7 minutes providing service to this patient-generated inquiry in the MyChart message  

## 2023-02-07 ENCOUNTER — Other Ambulatory Visit: Payer: Self-pay

## 2023-02-19 NOTE — Progress Notes (Deleted)
BH MD/PA/NP OP Progress Note  02/19/2023 3:33 PM Paula [name]  MRN:  [id number]  Chief Complaint: No chief complaint on file.  HPI: ***  Focalin replace for headache From concerta  Visit Diagnosis: No diagnosis found.  Past Psychiatric History: Please see initial evaluation for full details. I have reviewed the history. No updates at this time.     Past Medical History:  Past Medical History:  Diagnosis Date   Anemia    Asthma    exercise induced   BULIMIA 01/17/2008   Annotation: AGE 50 Qualifier: Diagnosis of  By: Paula Reid LPN, Wanda     Complication of anesthesia    vomited during last c/s   CPD (cephalo-pelvic disproportion) 12/22/2014   Cystic fibrosis carrier in second trimester, antepartum 05/29/2014   Depression    Eczema    GERD (gastroesophageal reflux disease)    chronic   Headache    h/o migraines   Hypothyroidism    Nausea    Oligomenorrhea 12/22/2014   PCOS (polycystic ovarian syndrome)    PONV (postoperative nausea and vomiting)    Rhinitis, allergic     Past Surgical History:  Procedure Laterality Date   CESAREAN SECTION  04/19/2013   LTCS; CPD   CESAREAN SECTION N/A 10/27/2015   Procedure: REPEAT CESAREAN SECTION;  Surgeon: Paula Harms, MD;  Location: [facility];  Service: Obstetrics;  Laterality: N/A;   ENDOSCOPIC TURBINATE REDUCTION Bilateral 07/26/2017   Procedure: ENDOSCOPIC INFERIOR  TURBINATE REDUCTION;  Surgeon: Paula Murders, MD;  Location: [facility];  Service: ENT;  Laterality: Bilateral;   FRONTAL SINUS EXPLORATION Bilateral 07/26/2017   Procedure: FRONTAL SINUS EXPLORATION;  Surgeon: Paula Murders, MD;  Location: [facility];  Service: ENT;  Laterality: Bilateral;   IMAGE GUIDED SINUS SURGERY Bilateral 07/26/2017   Procedure: IMAGE GUIDED SINUS SURGERY;  Surgeon: Paula Murders, MD;  Location: [facility];  Service: ENT;  Laterality: Bilateral;  GAVE DISK TO CECE 2-12   SEPTOPLASTY WITH ETHMOIDECTOMY, AND  MAXILLARY ANTROSTOMY Bilateral 07/26/2017   Procedure: SEPTOPLASTY WITH TOTAL  ETHMOIDECTOMY, AND MAXILLARY ANTROSTOMYWITH REMOVAL OF TISSUE,;  Surgeon: Paula Murders, MD;  Location: [facility];  Service: ENT;  Laterality: Bilateral;   TONSILLECTOMY     WISDOM TOOTH EXTRACTION      Family Psychiatric History: Please see initial evaluation for full details. I have reviewed the history. No updates at this time.     Family History:  Family History  Problem Relation Age of Onset   Endometriosis Mother    Bipolar disorder Mother    Alcohol abuse Mother    Heart disease Maternal Grandmother        valvular problem   Depression Maternal Grandmother    Breast cancer Neg Hx    Ovarian cancer Neg Hx    Colon cancer Neg Hx     Social History:  Social History   Socioeconomic History   Marital status: Married    Spouse name: Not on file   Number of children: Not on file   Years of education: Not on file   Highest education level: Not on file  Occupational History   Occupation: ED tech    Employer: [employer]  Tobacco Use   Smoking status: Never   Smokeless tobacco: Never  Vaping Use   Vaping status: Never Used  Substance and Sexual Activity   Alcohol use: Yes    Alcohol/week: 2.0 standard drinks of alcohol    Types: 2 Standard drinks or equivalent per  week    Comment: Social   Drug use: No   Sexual activity: Yes    Partners: Male    Birth control/protection: Pill  Other Topics Concern   Not on file  Social History Narrative   Not on file   Social Determinants of Health   Financial Resource Strain: Not on file  Food Insecurity: Not on file  Transportation Needs: Not on file  Physical Activity: Not on file  Stress: Not on file  Social Connections: Not on file    Allergies:  Allergies  Allergen Reactions   Iodine Anaphylaxis    Contrast    Shellfish Allergy Hives   Contrast Media [Iodinated Contrast Media]    Flagyl [Metronidazole]     Metabolic Disorder  Labs: Lab Results  Component Value Date   HGBA1C 5.1 01/25/2023   Lab Results  Component Value Date   PROLACTIN 35.8 (H) 03/14/2016   PROLACTIN 43.4 12/27/2007   Lab Results  Component Value Date   CHOL 295 (H) 01/25/2023   TRIG 74 01/25/2023   HDL 55 01/25/2023   CHOLHDL 5.4 (H) 01/25/2023   VLDL 17 09/25/2011   LDLCALC 228 (H) 01/25/2023   LDLCALC 214 (H) 08/10/2021   Lab Results  Component Value Date   TSH 1.850 01/25/2023   TSH 1.040 09/05/2022    Therapeutic Level Labs: No results found for: "LITHIUM" No results found for: "VALPROATE" No results found for: "CBMZ"  Current Medications: Current Outpatient Medications  Medication Sig Dispense Refill   albuterol (PROVENTIL HFA;VENTOLIN HFA) 108 (90 Base) MCG/ACT inhaler Inhale 2 puffs into the lungs every 6 (six) hours as needed for wheezing or shortness of breath.     ARAZLO 0.045 % LOTN Apply topically. (Patient not taking: Reported on 10/19/2022)     azelastine (OPTIVAR) 0.05 % ophthalmic solution 1 drop into affected eye Ophthalmic Twice a day 30 days 6 mL 3   dexmethylphenidate (FOCALIN XR) 15 MG 24 hr capsule Take 1 capsule (15 mg total) by mouth daily. 30 capsule 0   Drospirenone (SLYND) 4 MG TABS Take 1 tablet (4 mg total) by mouth daily. 84 tablet 3   Eflornithine HCl 13.9 % cream Apply 1 application  topically 2 (two) times daily. Apply a thin layer up to twice a day, at least 8 hours apart 45 g 6   EPINEPHrine 0.3 mg/0.3 mL IJ SOAJ injection as directed Injection prn 30 days 2 each 1   EPSOLAY 5 % cream Apply 1 Application topically every morning.     fluticasone (FLONASE) 50 MCG/ACT nasal spray 1 spray each nostril Nasally Once a day 30 days 16 g 3   gabapentin (NEURONTIN) 100 MG capsule Take 1 capsule (100 mg total) by mouth 2 (two) times daily for 7 days, THEN 2 capsules (200 mg total) 2 (two) times daily and continue this dose. 120 capsule 3   levocetirizine (XYZAL) 5 MG tablet Take 1 tablet (5 mg total) by  mouth daily. 30 tablet 3   levothyroxine (SYNTHROID) 75 MCG tablet Take 1 tablet (75 mcg total) by mouth daily before breakfast. 30 tablet 11   methylphenidate (CONCERTA) 36 MG PO CR tablet Take 1 tablet (36 mg total) by mouth daily before breakfast. 30 tablet 0   methylphenidate (RITALIN) 10 MG tablet Take 1 tablet (10 mg total) by mouth daily at 12 noon. 30 tablet 0   methylphenidate (RITALIN) 10 MG tablet Take 1 tablet (10 mg total) by mouth daily at 12 noon. 30 tablet  0   methylphenidate 36 MG PO CR tablet Take 1 tablet (36 mg total) by mouth daily. 30 tablet 0   mometasone (ELOCON) 0.1 % cream 1 application Externally Twice a day 30 days (Patient not taking: Reported on 10/19/2022) 90 g 1   montelukast (SINGULAIR) 10 MG tablet Take 10 mg by mouth as needed.     spironolactone (ALDACTONE) 100 MG tablet TAKE 2 TABLETS BY MOUTH DAILY (Patient not taking: Reported on 10/19/2022) 180 tablet 3   traZODone (DESYREL) 50 MG tablet Take 1/4 to 1/2 tablet (12.5 - 25 mg) by mouth at night for sleep. 30 tablet 11   venlafaxine XR (EFFEXOR-XR) 75 MG 24 hr capsule Take 3 capsules (225 mg total) by mouth daily with breakfast. 270 capsule 1   Current Facility-Administered Medications  Medication Dose Route Frequency Provider Last Rate Last Admin   cyanocobalamin ((VITAMIN B-12)) injection 1,000 mcg  1,000 mcg Intramuscular Once Doreene Burke, CNM         Musculoskeletal: Strength & Muscle Tone: within normal limits Gait & Station: normal Patient leans: N/A  Psychiatric Specialty Exam: Review of Systems  There were no vitals taken for this visit.There is no height or weight on file to calculate BMI.  General Appearance: {Appearance:22683}  Eye Contact:  {B[facility] EYE CONTACT:22684}  Speech:  Clear and Coherent  Volume:  Normal  Mood:  {BHH MOOD:22306}  Affect:  {Affect (PAA):22687}  Thought Process:  Coherent  Orientation:  Full (Time, Place, and Person)  Thought Content: Logical   Suicidal  Thoughts:  {ST/HT (PAA):22692}  Homicidal Thoughts:  {ST/HT (PAA):22692}  Memory:  Immediate;   Good  Judgement:  {Judgement (PAA):22694}  Insight:  {Insight (PAA):22695}  Psychomotor Activity:  Normal  Concentration:  Concentration: Good and Attention Span: Good  Recall:  Good  Fund of Knowledge: Good  Language: Good  Akathisia:  No  Handed:  Right  AIMS (if indicated): not done  Assets:  Communication Skills Desire for Improvement  ADL's:  Intact  Cognition: WNL  Sleep:  {B[facility] GOOD/FAIR/POOR:22877}   Screenings: GAD-7    Flowsheet Row Office Visit from 06/16/2020 in [facility] Office Visit from 04/28/2020 in [facility]  Total GAD-7 Score 7 8      PHQ2-9    Flowsheet Row Office Visit from 11/09/2022 in [facility] Office Visit from 10/20/2021 in [facility] Video Visit from 07/27/2021 in [facility] Video Visit from 03/30/2021 in [facility] Video Visit from 01/26/2021 in [facility]  PHQ-2 Total Score 0 0 1 0 0  PHQ-9 Total Score -- 1 -- -- --      Flowsheet Row Video Visit from 03/30/2021 in [facility] Video Visit from 11/10/2020 in [facility] Video Visit from 10/06/2020 in [facility]  C-SSRS RISK CATEGORY No Risk No Risk No Risk        Assessment and Plan:  Paula [name] is a 42 y.o. year old female with a history of PTSD, depression, anxiety, GERD, hypothyroidism, PCOS, who presents for follow up appointment for below.    1. PTSD (post-traumatic stress disorder) 2. MDD (major depressive disorder), recurrent, in partial remission (HCC) 3. GAD (generalized anxiety disorder) Acute stressors include: conflict with her daughter,  occasional marital conflict, recent overdose of her mother, concern about her grandfather with memory  loss  Other stressors include: childhood trauma, neck pain   History:    There has been steady improvement in depressive symptoms and anxiety since intervention being low for ADHD.  Will continue current dose of venlafaxine to target depression, PTSD and anxiety.    4. Attention deficit hyperactivity disorder (ADHD), unspecified ADHD type - neuropsych testing on 10/26/2022. Dx- ADHD, combined presentation, severe. UDS negative  10/2022 Significant improvement since starting Concerta.  Will add low-dose of Ritalin later in the day to address wearing off.  Discussed potential risk of hypertension, palpitation, anxiety and insomnia.    # Alcohol use Unchanged. She reports history of binge drinking.  She denies any craving for alcohol.  She is not interested in pharmacological treatment.  Will continue to assess this.    plan Continue venlafaxine 225 mg daily Continue Trazodone 12.5 mg at night as needed for insomnia- drowsiness from higher dose Continue Concerta 36 mg daily  Start Ritalin 10 mg at noon Next appointment: 9/30 at 1 pm, IP     Past trials of medication: sertraline, fluoxetine (nightmares), L-theanine , Ambien, Trazodone   The patient demonstrates the following risk factors for suicide: Chronic risk factors for suicide include: psychiatric disorder of depression, anxiety. Acute risk factors for suicide include: N/A. Protective factors for this patient include: positive social support, coping skills and hope for the future. Considering these factors, the overall suicide risk at this point appears to be low. Patient is appropriate for outpatient follow up.  Collaboration of Care: Collaboration of Care: {BH OP Collaboration of Care:21014065}  Patient/Guardian was advised Release of Information must be obtained prior to any record release in order to collaborate their care with an  outside provider. Patient/Guardian was advised if they have not already done so to contact the registration department to sign all necessary forms in order for [place] to release information regarding their care.   Consent: Patient/Guardian gives verbal consent for treatment and assignment of benefits for services provided during this visit. Patient/Guardian expressed understanding and agreed to proceed.    Neysa Hotter, MD 02/19/2023, 3:33 PM

## 2023-02-20 ENCOUNTER — Other Ambulatory Visit: Payer: Self-pay

## 2023-02-21 ENCOUNTER — Other Ambulatory Visit: Payer: Self-pay

## 2023-02-26 ENCOUNTER — Ambulatory Visit: Payer: 59 | Admitting: Psychiatry

## 2023-02-28 ENCOUNTER — Other Ambulatory Visit: Payer: Self-pay

## 2023-02-28 ENCOUNTER — Other Ambulatory Visit: Payer: Self-pay | Admitting: Psychiatry

## 2023-02-28 DIAGNOSIS — E039 Hypothyroidism, unspecified: Secondary | ICD-10-CM | POA: Diagnosis not present

## 2023-02-28 DIAGNOSIS — Z23 Encounter for immunization: Secondary | ICD-10-CM | POA: Diagnosis not present

## 2023-02-28 DIAGNOSIS — G8929 Other chronic pain: Secondary | ICD-10-CM | POA: Diagnosis not present

## 2023-02-28 DIAGNOSIS — F324 Major depressive disorder, single episode, in partial remission: Secondary | ICD-10-CM | POA: Diagnosis not present

## 2023-02-28 DIAGNOSIS — F411 Generalized anxiety disorder: Secondary | ICD-10-CM | POA: Diagnosis not present

## 2023-02-28 DIAGNOSIS — M25511 Pain in right shoulder: Secondary | ICD-10-CM | POA: Diagnosis not present

## 2023-02-28 DIAGNOSIS — F4310 Post-traumatic stress disorder, unspecified: Secondary | ICD-10-CM | POA: Diagnosis not present

## 2023-02-28 DIAGNOSIS — Z0000 Encounter for general adult medical examination without abnormal findings: Secondary | ICD-10-CM | POA: Diagnosis not present

## 2023-02-28 MED ORDER — DEXMETHYLPHENIDATE HCL ER 15 MG PO CP24
15.0000 mg | ORAL_CAPSULE | Freq: Every day | ORAL | 0 refills | Status: DC
  Filled 2023-03-05 – 2023-03-06 (×2): qty 30, 30d supply, fill #0

## 2023-02-28 MED ORDER — ATORVASTATIN CALCIUM 40 MG PO TABS
40.0000 mg | ORAL_TABLET | Freq: Every day | ORAL | 5 refills | Status: AC
  Filled 2023-02-28: qty 30, 30d supply, fill #0
  Filled 2023-03-26: qty 30, 30d supply, fill #1
  Filled 2023-04-28 – 2023-04-30 (×3): qty 30, 30d supply, fill #2

## 2023-02-28 MED ORDER — METHYLPHENIDATE HCL 10 MG PO TABS
10.0000 mg | ORAL_TABLET | Freq: Every day | ORAL | 0 refills | Status: DC
  Filled 2023-02-28: qty 30, 30d supply, fill #0

## 2023-02-28 MED FILL — Levothyroxine Sodium Tab 75 MCG: ORAL | 30 days supply | Qty: 30 | Fill #7 | Status: AC

## 2023-03-02 NOTE — Progress Notes (Unsigned)
BH MD/PA/NP OP Progress Note  03/06/2023 5:19 PM Paula [name]  MRN:  [id number]  Chief Complaint:  Chief Complaint  Patient presents with   Follow-up   HPI:  This is a follow-up appointment for PTSD, depression, anxiety and ADHD.  She states that she has been doing well.  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  She states she sees children like her, and would like to make a positive impact.  She had alopecia, which she attributes to stress.  It has happened before, and she denies much concern about this.  Although she was concerned about her husband, who has been bitter constantly, he has been doing better lately.  Both of her children are doing great. One enjoys hoarse back riding, and another enjoys karate.  She is able to enjoy them growing.  She feels she is in a good place.  She denies feeling depressed.  She has been sleeping better since drinking chamomile tea, and supplements as recommended by her primary care.  She denies SI.   ADHD-she likes the current medication regimen.  She believes it has helped her significantly without much headache.  Although she had migraine on Sunday through the day, it is much better compared to Focalin.      Tobacco Alcohol Other substances/  Current denies Not drank since the last week.  Used to drink 2-3 Glasses of [place] per week denies  Past denies binge drinking, a bottle of wine , last in Jan 2024  denies  Past Treatment           Wt Readings from Last 3 Encounters:  03/06/23 160 lb 3.2 oz (72.7 kg)  11/09/22 161 lb 12.8 oz (73.4 kg)  10/19/22 159 lb (72.1 kg)     Visit Diagnosis:    ICD-10-CM   1. PTSD (post-traumatic stress disorder)  F43.10     2. MDD (major depressive disorder), recurrent, in partial remission (HCC)  F33.41     3. GAD (generalized anxiety disorder)  F41.1     4. Attention deficit hyperactivity disorder (ADHD), unspecified ADHD type [F90.9]  F90.9     5. Insomnia,  unspecified type  G47.00       Past Psychiatric History: Please see initial evaluation for full details. I have reviewed the history. No updates at this time.     Past Medical History:  Past Medical History:  Diagnosis Date   Anemia    Asthma    exercise induced   BULIMIA 01/17/2008   Annotation: AGE 18 Qualifier: Diagnosis of  By: Lorrene Reid LPN, Wanda     Complication of anesthesia    vomited during last c/s   CPD (cephalo-pelvic disproportion) 12/22/2014   Cystic fibrosis carrier in second trimester, antepartum 05/29/2014   Depression    Eczema    GERD (gastroesophageal reflux disease)    chronic   Headache    h/o migraines   Hypothyroidism    Nausea    Oligomenorrhea 12/22/2014   PCOS (polycystic ovarian syndrome)    PONV (postoperative nausea and vomiting)    Rhinitis, allergic     Past Surgical History:  Procedure Laterality Date   CESAREAN SECTION  04/19/2013   LTCS; CPD   CESAREAN SECTION N/A 10/27/2015   Procedure: REPEAT CESAREAN SECTION;  Surgeon: Herold Harms, MD;  Location: [facility] OR[facility];  Service: Obstetrics;  Laterality: N/A;   ENDOSCOPIC TURBINATE REDUCTION Bilateral 07/26/2017   Procedure: ENDOSCOPIC INFERIOR  TURBINATE  REDUCTION;  Surgeon: Vernie Murders, MD;  Location: [facility];  Service: ENT;  Laterality: Bilateral;   FRONTAL SINUS EXPLORATION Bilateral 07/26/2017   Procedure: FRONTAL SINUS EXPLORATION;  Surgeon: Vernie Murders, MD;  Location: [facility];  Service: ENT;  Laterality: Bilateral;   IMAGE GUIDED SINUS SURGERY Bilateral 07/26/2017   Procedure: IMAGE GUIDED SINUS SURGERY;  Surgeon: Vernie Murders, MD;  Location: [facility];  Service: ENT;  Laterality: Bilateral;  GAVE DISK TO CECE 2-12   SEPTOPLASTY WITH ETHMOIDECTOMY, AND MAXILLARY ANTROSTOMY Bilateral 07/26/2017   Procedure: SEPTOPLASTY WITH TOTAL  ETHMOIDECTOMY, AND MAXILLARY ANTROSTOMYWITH REMOVAL OF TISSUE,;  Surgeon: Vernie Murders, MD;  Location: [facility];   Service: ENT;  Laterality: Bilateral;   TONSILLECTOMY     WISDOM TOOTH EXTRACTION      Family Psychiatric History: Please see initial evaluation for full details. I have reviewed the history. No updates at this time.     Family History:  Family History  Problem Relation Age of Onset   Endometriosis Mother    Bipolar disorder Mother    Alcohol abuse Mother    Heart disease Maternal Grandmother        valvular problem   Depression Maternal Grandmother    Breast cancer Neg Hx    Ovarian cancer Neg Hx    Colon cancer Neg Hx     Social History:  Social History   Socioeconomic History   Marital status: Married    Spouse name: Not on file   Number of children: Not on file   Years of education: Not on file   Highest education level: Not on file  Occupational History   Occupation: ED tech    Employer: [employer]  Tobacco Use   Smoking status: Never   Smokeless tobacco: Never  Vaping Use   Vaping status: Never Used  Substance and Sexual Activity   Alcohol use: Yes    Alcohol/week: 2.0 standard drinks of alcohol    Types: 2 Standard drinks or equivalent per week    Comment: Social   Drug use: No   Sexual activity: Yes    Partners: Male    Birth control/protection: Pill  Other Topics Concern   Not on file  Social History Narrative   Not on file   Social Determinants of Health   Financial Resource Strain: Low Risk  (02/27/2023)   Received from [facility]   Overall Financial Resource Strain (CARDIA)    Difficulty of Paying Living Expenses: Not hard at all  Food Insecurity: No Food Insecurity (02/27/2023)   Received from [facility]   Hunger Vital Sign    Worried About Running Out of Food in the Last Year: Never true    Ran Out of Food in the Last Year: Never true  Transportation Needs: No Transportation Needs (02/27/2023)   Received from [facility] - Transportation    In the past 12 months, has lack of  transportation kept you from medical appointments or from getting medications?: No    Lack of Transportation (Non-Medical): No  Physical Activity: Not on file  Stress: Not on file  Social Connections: Not on file    Allergies:  Allergies  Allergen Reactions   Iodine Anaphylaxis    Contrast    Shellfish Allergy Hives   Contrast Media [Iodinated Contrast Media]    Flagyl [Metronidazole]     Metabolic Disorder Labs: Lab Results  Component Value Date   HGBA1C  5.1 01/25/2023   Lab Results  Component Value Date   PROLACTIN 35.8 (H) 03/14/2016   PROLACTIN 43.4 12/27/2007   Lab Results  Component Value Date   CHOL 295 (H) 01/25/2023   TRIG 74 01/25/2023   HDL 55 01/25/2023   CHOLHDL 5.4 (H) 01/25/2023   VLDL 17 09/25/2011   LDLCALC 228 (H) 01/25/2023   LDLCALC 214 (H) 08/10/2021   Lab Results  Component Value Date   TSH 1.850 01/25/2023   TSH 1.040 09/05/2022    Therapeutic Level Labs: No results found for: "LITHIUM" No results found for: "VALPROATE" No results found for: "CBMZ"  Current Medications: Current Outpatient Medications  Medication Sig Dispense Refill   [START ON 04/05/2023] dexmethylphenidate (FOCALIN XR) 15 MG 24 hr capsule Take 1 capsule (15 mg total) by mouth every morning. 30 capsule 0   [START ON 05/05/2023] dexmethylphenidate (FOCALIN XR) 15 MG 24 hr capsule Take 1 capsule (15 mg total) by mouth every morning. 30 capsule 0   [START ON 03/23/2023] methylphenidate (RITALIN) 10 MG tablet Take 1 tablet (10 mg total) by mouth daily before breakfast. 30 tablet 0   [START ON 04/22/2023] methylphenidate (RITALIN) 10 MG tablet Take 1 tablet (10 mg total) by mouth daily before breakfast. 30 tablet 0   albuterol (PROVENTIL HFA;VENTOLIN HFA) 108 (90 Base) MCG/ACT inhaler Inhale 2 puffs into the lungs every 6 (six) hours as needed for wheezing or shortness of breath.     ARAZLO 0.045 % LOTN Apply topically. (Patient not taking: Reported on 10/19/2022)      atorvastatin (LIPITOR) 40 MG tablet Take 1 tablet (40 mg total) by mouth daily. 30 tablet 5   azelastine (OPTIVAR) 0.05 % ophthalmic solution 1 drop into affected eye Ophthalmic Twice a day 30 days 6 mL 3   Drospirenone (SLYND) 4 MG TABS Take 1 tablet (4 mg total) by mouth daily. 84 tablet 3   Eflornithine HCl 13.9 % cream Apply 1 application  topically 2 (two) times daily. Apply a thin layer up to twice a day, at least 8 hours apart 45 g 6   EPINEPHrine 0.3 mg/0.3 mL IJ SOAJ injection as directed Injection prn 30 days 2 each 1   EPSOLAY 5 % cream Apply 1 Application topically every morning.     fluticasone (FLONASE) 50 MCG/ACT nasal spray 1 spray each nostril Nasally Once a day 30 days 16 g 3   gabapentin (NEURONTIN) 100 MG capsule Take 1 capsule (100 mg total) by mouth 2 (two) times daily for 7 days, THEN 2 capsules (200 mg total) 2 (two) times daily and continue this dose. 120 capsule 3   levocetirizine (XYZAL) 5 MG tablet Take 1 tablet (5 mg total) by mouth daily. 30 tablet 3   levothyroxine (SYNTHROID) 75 MCG tablet Take 1 tablet (75 mcg total) by mouth daily before breakfast. 30 tablet 11   methylphenidate (RITALIN) 10 MG tablet Take 1 tablet (10 mg total) by mouth daily at 12 noon. 30 tablet 0   methylphenidate (RITALIN) 10 MG tablet Take 1 tablet (10 mg total) by mouth daily at 12 noon. 30 tablet 0   mometasone (ELOCON) 0.1 % cream 1 application Externally Twice a day 30 days (Patient not taking: Reported on 10/19/2022) 90 g 1   montelukast (SINGULAIR) 10 MG tablet Take 10 mg by mouth as needed.     spironolactone (ALDACTONE) 100 MG tablet TAKE 2 TABLETS BY MOUTH DAILY (Patient not taking: Reported on 10/19/2022) 180 tablet 3   traZODone (  DESYREL) 50 MG tablet Take 1/4 to 1/2 tablet (12.5 - 25 mg) by mouth at night for sleep. 30 tablet 11   venlafaxine XR (EFFEXOR-XR) 75 MG 24 hr capsule Take 3 capsules (225 mg total) by mouth daily with breakfast. 270 capsule 1   Current  Facility-Administered Medications  Medication Dose Route Frequency Provider Last Rate Last Admin   cyanocobalamin ((VITAMIN B-12)) injection 1,000 mcg  1,000 mcg Intramuscular Once Doreene Burke, CNM         Musculoskeletal: Strength & Muscle Tone: within normal limits Gait & Station: normal Patient leans: N/A  Psychiatric Specialty Exam: Review of Systems  Psychiatric/Behavioral:  Negative for agitation, behavioral problems, confusion, decreased concentration, dysphoric mood, hallucinations, self-injury, sleep disturbance and suicidal ideas. The patient is nervous/anxious. The patient is not hyperactive.   All other systems reviewed and are negative.   Blood pressure 133/89, pulse 82, temperature 97.6 F (36.4 C), temperature source Skin, height 4\' 11"  (1.499 m), weight 160 lb 3.2 oz (72.7 kg).Body mass index is 32.36 kg/m.  General Appearance: Well Groomed  Eye Contact:  Good  Speech:  Clear and Coherent  Volume:  Normal  Mood:   good  Affect:  Appropriate, Congruent, and Full Range  Thought Process:  Coherent  Orientation:  Full (Time, Place, and Person)  Thought Content: Logical   Suicidal Thoughts:  No  Homicidal Thoughts:  No  Memory:  Immediate;   Good  Judgement:  Good  Insight:  Good  Psychomotor Activity:  Normal  Concentration:  Concentration: Good and Attention Span: Good  Recall:  Good  Fund of Knowledge: Good  Language: Good  Akathisia:  No  Handed:  Right  AIMS (if indicated): not done  Assets:  Communication Skills Desire for Improvement  ADL's:  Intact  Cognition: WNL  Sleep:  Good   Screenings: GAD-7    Flowsheet Row Office Visit from 03/06/2023 in [facility] Office Visit from 06/16/2020 in [facility] Office Visit from 04/28/2020 in [facility]  Total GAD-7 Score 0 7 8      PHQ2-9    Flowsheet Row Office Visit from 03/06/2023 in [facility]  Office Visit from 11/09/2022 in [facility] Office Visit from 10/20/2021 in [facility] Video Visit from 07/27/2021 in [facility] Video Visit from 03/30/2021 in [facility]  PHQ-2 Total Score 0 0 0 1 0  PHQ-9 Total Score -- -- 1 -- --      Flowsheet Row Video Visit from 03/30/2021 in [facility] Video Visit from 11/10/2020 in [facility] Video Visit from 10/06/2020 in [facility]  C-SSRS RISK CATEGORY No Risk No Risk No Risk        Assessment and Plan:  DHRITHI RICHE is a 42 y.o. year old female with a history of PTSD, depression, anxiety, GERD, hypothyroidism, PCOS, who presents for follow up appointment for below.   1. PTSD (post-traumatic stress disorder) 2. MDD (major depressive disorder), recurrent, in partial remission (HCC) 3. GAD (generalized anxiety disorder) Acute stressors include: conflict with her daughter, occasional marital conflict, recent overdose of her mother, concern about her grandfather with memory loss  Other stressors include: childhood trauma, neck pain   History:    There has been steady improvement in her mood symptoms.  Will continue  current dose of venlafaxine to target PTSD, and anxiety.   4. Attention deficit hyperactivity disorder (ADHD), unspecified ADHD type [F90.9] - neuropsych testing on 10/26/2022. Dx- ADHD, combined presentation, severe. UDS negative  10/2022  She reports significant benefit since switching from Concerta to current medication regimen with less migraine.  Will continue current dose of Focalin along with Ritalin to target ADHD.   # Insomnia Overall improving since starting chamomile, supplement along with trazodone.  Will continue current dose of trazodone  as needed for insomnia.    # Alcohol use Improving.  She reports history of binge drinking.  She denies any craving for alcohol.  She is not interested in pharmacological treatment.  Will continue to assess this.    plan Continue venlafaxine 225 mg daily Continue Trazodone 12.5 mg at night as needed for insomnia- drowsiness from higher dose Continue Focalin 15 mg daily - [facility] Continue Ritalin 10 mg daily  Next appointment: 12/20 at 10 am, video     Past trials of medication: sertraline, fluoxetine (nightmares), L-theanine , Ambien, Trazodone   The patient demonstrates the following risk factors for suicide: Chronic risk factors for suicide include: psychiatric disorder of depression, anxiety. Acute risk factors for suicide include: N/A. Protective factors for this patient include: positive social support, coping skills and hope for the future. Considering these factors, the overall suicide risk at this point appears to be low. Patient is appropriate for outpatient follow up.  Collaboration of Care: Collaboration of Care: Other reviewed notes in Epic  Patient/Guardian was advised Release of Information must be obtained prior to any record release in order to collaborate their care with an outside provider. Patient/Guardian was advised if they have not already done so to contact the registration department to sign all necessary forms in order for [place] to release information regarding their care.   Consent: Patient/Guardian gives verbal consent for treatment and assignment of benefits for services provided during this visit. Patient/Guardian expressed understanding and agreed to proceed.    Paula Hotter, MD 03/06/2023, 5:19 PM

## 2023-03-05 ENCOUNTER — Other Ambulatory Visit: Payer: Self-pay

## 2023-03-06 ENCOUNTER — Ambulatory Visit (INDEPENDENT_AMBULATORY_CARE_PROVIDER_SITE_OTHER): Payer: 59 | Admitting: Psychiatry

## 2023-03-06 ENCOUNTER — Other Ambulatory Visit: Payer: Self-pay

## 2023-03-06 ENCOUNTER — Encounter: Payer: Self-pay | Admitting: Psychiatry

## 2023-03-06 VITALS — BP 133/89 | HR 82 | Temp 97.6°F | Ht 59.0 in | Wt 160.2 lb

## 2023-03-06 DIAGNOSIS — G4700 Insomnia, unspecified: Secondary | ICD-10-CM | POA: Diagnosis not present

## 2023-03-06 DIAGNOSIS — F909 Attention-deficit hyperactivity disorder, unspecified type: Secondary | ICD-10-CM

## 2023-03-06 DIAGNOSIS — F4310 Post-traumatic stress disorder, unspecified: Secondary | ICD-10-CM

## 2023-03-06 DIAGNOSIS — F3341 Major depressive disorder, recurrent, in partial remission: Secondary | ICD-10-CM | POA: Diagnosis not present

## 2023-03-06 DIAGNOSIS — F411 Generalized anxiety disorder: Secondary | ICD-10-CM

## 2023-03-06 MED ORDER — DEXMETHYLPHENIDATE HCL ER 15 MG PO CP24
15.0000 mg | ORAL_CAPSULE | ORAL | 0 refills | Status: DC
  Filled 2023-04-04: qty 10, 10d supply, fill #0
  Filled 2023-04-04: qty 20, 20d supply, fill #0

## 2023-03-06 MED ORDER — METHYLPHENIDATE HCL 10 MG PO TABS
10.0000 mg | ORAL_TABLET | Freq: Every day | ORAL | 0 refills | Status: DC
Start: 2023-03-23 — End: 2023-05-18
  Filled 2023-04-28: qty 30, 30d supply, fill #0

## 2023-03-06 MED ORDER — DEXMETHYLPHENIDATE HCL ER 15 MG PO CP24
15.0000 mg | ORAL_CAPSULE | ORAL | 0 refills | Status: DC
  Filled 2023-05-10: qty 30, 30d supply, fill #0

## 2023-03-06 MED ORDER — DEXMETHYLPHENIDATE HCL ER 15 MG PO CP24: 15.0000 mg | ORAL_CAPSULE | Freq: Every day | ORAL | 0 refills | Status: DC

## 2023-03-06 MED ORDER — METHYLPHENIDATE HCL 10 MG PO TABS
10.0000 mg | ORAL_TABLET | Freq: Every day | ORAL | 0 refills | Status: DC
Start: 2023-04-22 — End: 2023-08-13

## 2023-03-06 NOTE — Patient Instructions (Signed)
Continue venlafaxine 225 mg daily Continue Trazodone 12.5 mg at night as needed for insomnia Continue Focalin 15 mg daily - [facility] Continue Ritalin 10 mg daily  Next appointment: 12/20 at 10 am

## 2023-03-26 ENCOUNTER — Other Ambulatory Visit (HOSPITAL_COMMUNITY): Payer: Self-pay

## 2023-03-26 MED FILL — Levothyroxine Sodium Tab 75 MCG: ORAL | 30 days supply | Qty: 30 | Fill #8 | Status: AC

## 2023-03-27 ENCOUNTER — Other Ambulatory Visit (HOSPITAL_COMMUNITY): Payer: Self-pay

## 2023-03-27 ENCOUNTER — Other Ambulatory Visit: Payer: Self-pay

## 2023-03-27 ENCOUNTER — Other Ambulatory Visit (HOSPITAL_BASED_OUTPATIENT_CLINIC_OR_DEPARTMENT_OTHER): Payer: Self-pay

## 2023-04-04 ENCOUNTER — Other Ambulatory Visit: Payer: Self-pay

## 2023-04-09 ENCOUNTER — Other Ambulatory Visit: Payer: Self-pay

## 2023-04-17 ENCOUNTER — Other Ambulatory Visit: Payer: Self-pay | Admitting: Internal Medicine

## 2023-04-17 DIAGNOSIS — G8929 Other chronic pain: Secondary | ICD-10-CM

## 2023-04-28 ENCOUNTER — Ambulatory Visit
Admission: RE | Admit: 2023-04-28 | Discharge: 2023-04-28 | Disposition: A | Discharge status: Home / Self Care | Payer: 59 | Source: Ambulatory Visit | Attending: Internal Medicine | Admitting: Internal Medicine

## 2023-04-28 ENCOUNTER — Other Ambulatory Visit: Payer: Self-pay | Admitting: Psychiatry

## 2023-04-28 DIAGNOSIS — M25511 Pain in right shoulder: Secondary | ICD-10-CM | POA: Insufficient documentation

## 2023-04-28 DIAGNOSIS — M19011 Primary osteoarthritis, right shoulder: Secondary | ICD-10-CM | POA: Diagnosis not present

## 2023-04-28 DIAGNOSIS — S96911A Strain of unspecified muscle and tendon at ankle and foot level, right foot, initial encounter: Secondary | ICD-10-CM | POA: Diagnosis not present

## 2023-04-28 DIAGNOSIS — G8929 Other chronic pain: Secondary | ICD-10-CM | POA: Diagnosis not present

## 2023-04-28 MED FILL — Levothyroxine Sodium Tab 75 MCG: ORAL | 30 days supply | Qty: 30 | Fill #9 | Status: CN

## 2023-04-29 ENCOUNTER — Other Ambulatory Visit: Payer: Self-pay

## 2023-04-30 ENCOUNTER — Other Ambulatory Visit: Payer: Self-pay

## 2023-04-30 MED FILL — Levothyroxine Sodium Tab 75 MCG: ORAL | 30 days supply | Qty: 30 | Fill #9 | Status: AC

## 2023-05-01 ENCOUNTER — Other Ambulatory Visit (HOSPITAL_COMMUNITY): Payer: Self-pay

## 2023-05-04 ENCOUNTER — Emergency Department: Payer: 59

## 2023-05-04 ENCOUNTER — Emergency Department
Admission: EM | Admit: 2023-05-04 | Discharge: 2023-05-04 | Disposition: A | Discharge status: Home / Self Care | Payer: 59 | Attending: Emergency Medicine | Admitting: Emergency Medicine

## 2023-05-04 ENCOUNTER — Other Ambulatory Visit: Payer: Self-pay

## 2023-05-04 ENCOUNTER — Encounter: Payer: Self-pay | Admitting: Emergency Medicine

## 2023-05-04 DIAGNOSIS — G500 Trigeminal neuralgia: Secondary | ICD-10-CM | POA: Insufficient documentation

## 2023-05-04 DIAGNOSIS — R519 Headache, unspecified: Secondary | ICD-10-CM | POA: Diagnosis present

## 2023-05-04 DIAGNOSIS — R7309 Other abnormal glucose: Secondary | ICD-10-CM | POA: Diagnosis not present

## 2023-05-04 DIAGNOSIS — R4182 Altered mental status, unspecified: Secondary | ICD-10-CM | POA: Diagnosis not present

## 2023-05-04 DIAGNOSIS — I10 Essential (primary) hypertension: Secondary | ICD-10-CM | POA: Diagnosis not present

## 2023-05-04 DIAGNOSIS — R200 Anesthesia of skin: Secondary | ICD-10-CM | POA: Diagnosis not present

## 2023-05-04 HISTORY — DX: Attention-deficit hyperactivity disorder, unspecified type: F90.9

## 2023-05-04 LAB — CBC
HCT: 40.9 % (ref 36.0–46.0)
HCT: 39.3 % (ref 36.0–46.0)
Hemoglobin: 14.2 g/dL (ref 12.0–15.0)
Hemoglobin: 13.7 g/dL (ref 12.0–15.0)
MCH: 31 pg (ref 26.0–34.0)
MCH: 30.9 pg (ref 26.0–34.0)
MCHC: 34.7 g/dL (ref 30.0–36.0)
MCHC: 34.9 g/dL (ref 30.0–36.0)
MCV: 89.3 fL (ref 80.0–100.0)
MCV: 88.5 fL (ref 80.0–100.0)
Platelets: 303 10*3/uL (ref 150–400)
Platelets: 292 10*3/uL (ref 150–400)
RBC: 4.58 MIL/uL (ref 3.87–5.11)
RBC: 4.44 MIL/uL (ref 3.87–5.11)
RDW: 11.7 % (ref 11.5–15.5)
RDW: 11.7 % (ref 11.5–15.5)
WBC: 8.3 10*3/uL (ref 4.0–10.5)
WBC: 8.8 10*3/uL (ref 4.0–10.5)
nRBC: 0 % (ref 0.0–0.2)
nRBC: 0 % (ref 0.0–0.2)

## 2023-05-04 LAB — APTT: aPTT: 29 s (ref 24–36)

## 2023-05-04 LAB — COMPREHENSIVE METABOLIC PANEL
ALT: 54 U/L — ABNORMAL HIGH (ref 0–44)
AST: 28 U/L (ref 15–41)
Albumin: 4.5 g/dL (ref 3.5–5.0)
Alkaline Phosphatase: 81 U/L (ref 38–126)
Anion gap: 8 (ref 5–15)
BUN: 16 mg/dL (ref 6–20)
CO2: 25 mmol/L (ref 22–32)
Calcium: 9.2 mg/dL (ref 8.9–10.3)
Chloride: 104 mmol/L (ref 98–111)
Creatinine, Ser: 0.8 mg/dL (ref 0.44–1.00)
GFR, Estimated: >60 mL/min (ref >60)
Glucose, Bld: 85 mg/dL (ref 70–99)
Potassium: 3.4 mmol/L — ABNORMAL LOW (ref 3.5–5.1)
Sodium: 137 mmol/L (ref 135–145)
Total Bilirubin: 1.1 mg/dL (ref <1.2)
Total Protein: 7.6 g/dL (ref 6.5–8.1)

## 2023-05-04 LAB — URINALYSIS, ROUTINE W REFLEX MICROSCOPIC
Bilirubin Urine: NEGATIVE
Glucose, UA: NEGATIVE mg/dL
Hgb urine dipstick: NEGATIVE
Ketones, ur: NEGATIVE mg/dL
Nitrite: NEGATIVE
Protein, ur: NEGATIVE mg/dL
Specific Gravity, Urine: 1.02 (ref 1.005–1.030)
pH: 5 (ref 5.0–8.0)

## 2023-05-04 LAB — PROTIME-INR
INR: 1 (ref 0.8–1.2)
Prothrombin Time: 12.9 s (ref 11.4–15.2)

## 2023-05-04 LAB — DIFFERENTIAL
Abs Immature Granulocytes: 0.02 10*3/uL (ref 0.00–0.07)
Basophils Absolute: 0.1 10*3/uL (ref 0.0–0.1)
Basophils Relative: 1 %
Eosinophils Absolute: 0.2 10*3/uL (ref 0.0–0.5)
Eosinophils Relative: 2 %
Immature Granulocytes: 0 %
Lymphocytes Relative: 33 %
Lymphs Abs: 2.9 10*3/uL (ref 0.7–4.0)
Monocytes Absolute: 0.5 10*3/uL (ref 0.1–1.0)
Monocytes Relative: 6 %
Neutro Abs: 5.1 10*3/uL (ref 1.7–7.7)
Neutrophils Relative %: 58 %

## 2023-05-04 LAB — POC URINE PREG, ED: Preg Test, Ur: NEGATIVE

## 2023-05-04 LAB — ETHANOL: Alcohol, Ethyl (B): <10 mg/dL (ref <10)

## 2023-05-04 LAB — CBG MONITORING, ED: Glucose-Capillary: 73 mg/dL (ref 70–99)

## 2023-05-04 MED ORDER — GADOBUTROL 1 MMOL/ML IV SOLN
7.0000 mL | Freq: Once | INTRAVENOUS | Status: AC | PRN
  Administered 2023-05-04: 7 mL via INTRAVENOUS

## 2023-05-04 MED ORDER — LORAZEPAM 2 MG/ML IJ SOLN
1.0000 mg | Freq: Once | INTRAMUSCULAR | Status: AC
Start: 2023-05-04 — End: 2023-05-04
  Administered 2023-05-04: 1 mg via INTRAVENOUS
  Filled 2023-05-04: qty 1

## 2023-05-04 MED ORDER — CARBAMAZEPINE ER 100 MG PO TB12: 100.0000 mg | ORAL_TABLET | Freq: Two times a day (BID) | ORAL | 2 refills | Status: AC

## 2023-05-04 MED ORDER — CARBAMAZEPINE 200 MG PO TABS
200.0000 mg | ORAL_TABLET | Freq: Once | ORAL | Status: AC
  Administered 2023-05-04: 200 mg via ORAL
  Filled 2023-05-04: qty 1

## 2023-05-04 MED ORDER — SODIUM CHLORIDE 0.9% FLUSH
3.0000 mL | Freq: Once | INTRAVENOUS | Status: AC
  Administered 2023-05-04: 3 mL via INTRAVENOUS

## 2023-05-04 NOTE — ED Provider Triage Note (Signed)
Emergency Medicine Provider Triage Evaluation Note  Paula [name] , a 42 y.o. female  was evaluated in triage.  Pt complains of not feeling right, twitching in right eye, numbness in right arm, sx have been on and off for awhile but worsened this morning, states her boss made her come to the ER.  Patient works at surgical center.  Review of Systems  Positive:  Negative:   Physical Exam  BP (!) 167/101 (BP Location: Left Arm)   Pulse 88   Temp 98.9 F (37.2 C) (Oral)   Resp 18   SpO2 100%  Gen:   Awake, no distress, right eye twitching constantly, Resp:  Normal effort  MSK:   Moves extremities without difficulty  Other:  Slurred speech, no pronator drift, cranial nerves II through XII grossly intact  Medical Decision Making  Medically screening exam initiated at 1:33 PM.  Appropriate orders placed.  Paula [name] was informed that the remainder of the evaluation will be completed by another provider, this initial triage assessment does not replace that evaluation, and the importance of remaining in the ED until their evaluation is complete.     Faythe Ghee, PA-C 05/04/23 1334

## 2023-05-04 NOTE — ED Triage Notes (Signed)
Patient to ED via POV for hypertension and headache. Started today- BP taken at work and elevated. Had scans done for nerve issues recently. States she is having trouble thinking. Right eye noted to be twitching.

## 2023-05-04 NOTE — ED Notes (Signed)
Pt's husband stating to this RN that pt's symptoms are getting worse. This RN made Lauren PA aware and this RN and PA went to assess pt. Pt with right sided facial droop and right arm appears locked up and fingers are contracted. Pt also reports HA. RN went to consult MD Derrill Kay who advised to call CODE STROKE for worsening symptoms/acute change. Pt taken to CT 3. Family taken to 18H.

## 2023-05-04 NOTE — ED Notes (Signed)
Code stroke called to [facility]- bed 18h assigned

## 2023-05-04 NOTE — ED Notes (Signed)
Code stroke called to [facility] with right side weakness and onset of symptoms 30 minutes ago per Cathie Beams RN

## 2023-05-04 NOTE — Discharge Instructions (Signed)
Please seek medical attention for any high fevers, chest pain, shortness of breath, change in behavior, persistent vomiting, bloody stool or any other new or concerning symptoms.  

## 2023-05-04 NOTE — Progress Notes (Signed)
   05/04/23 1600  Spiritual Encounters  Type of Visit Initial  Care provided to: Pt and family  Referral source Code page  Reason for visit Urgent spiritual support  OnCall Visit Yes  Spiritual Framework  Patient Stress Factors Lack of knowledge  Family Stress Factors Lack of knowledge  Interventions  Spiritual Care Interventions Made Established relationship of care and support;Compassionate presence;Prayer  Intervention Outcomes  Outcomes Connection to spiritual care;Awareness of support  Spiritual Care Plan  Spiritual Care Issues Still Outstanding No further spiritual care needs at this time (see row info)   Spoke with patient and spouse of patient. They asked me to prayer with them for some encouragement. Let them know that Spiritual Care is here 24/7 if they needed to speak to someone later.

## 2023-05-04 NOTE — ED Provider Notes (Signed)
[facility] Provider Note    Event Date/Time   First MD Initiated Contact with Patient 05/04/23 1540     (approximate)   History   Right facial tics and pain   HPI  Paula [name] is a 42 y.o. female Paula [name] to the emergency department today because of concerns for right facial eye tics and pain.  Patient states she has been having some right facial pain for some time.  Has seen her PCP as well as neurology for this.  In addition she states she has been having some right hand numbness.  However today she started having right eye twitching.  This was new for her.  The spasms come and go.  It is quite painful when it is there.     Physical Exam   Triage Vital Signs: ED Triage Vitals  Encounter Vitals Group     BP 05/04/23 1324 (!) 167/101     Systolic BP Percentile --      Diastolic BP Percentile --      Pulse Rate 05/04/23 1324 88     Resp 05/04/23 1324 18     Temp 05/04/23 1329 98.9 F (37.2 C)     Temp Source 05/04/23 1329 Oral     SpO2 05/04/23 1324 100 %     Weight --      Height --      Head Circumference --      Peak Flow --      Pain Score 05/04/23 1325 2     Pain Loc --      Pain Education --      Exclude from Growth Chart --     Most recent vital signs: Vitals:   05/04/23 1324 05/04/23 1329  BP: (!) 167/101   Pulse: 88   Resp: 18   Temp:  98.9 F (37.2 C)  SpO2: 100%    General: Awake, alert, oriented. CV:  Good peripheral perfusion.  Resp:  Normal effort.  Abd:  No distention.    ED Results / Procedures / Treatments   Labs (all labs ordered are listed, but only abnormal results are displayed) Labs Reviewed  COMPREHENSIVE METABOLIC PANEL - Abnormal; Notable for the following components:      Result Value   Potassium 3.4 (*)    ALT 54 (*)    All other components within normal limits  URINALYSIS, ROUTINE W REFLEX MICROSCOPIC - Abnormal; Notable for the following components:   Color, Urine YELLOW (*)    APPearance  CLEAR (*)    Leukocytes,Ua TRACE (*)    Bacteria, UA RARE (*)    All other components within normal limits  CBC  PROTIME-INR  APTT  ETHANOL  CBC  DIFFERENTIAL  CBG MONITORING, ED  POC URINE PREG, ED  POC URINE PREG, ED     EKG  I, Phineas Semen, attending physician, personally viewed and interpreted this EKG  EKG Time: 1329 Rate: 72 Rhythm: normal sinus rhythm Axis: normal Intervals: qtc 429 QRS: low voltage QRS ST changes: no st elevation Impression: abnormal ekg   RADIOLOGY I independently interpreted and visualized the CT head. My interpretation: No bleed Radiology interpretation:  IMPRESSION:  1. No acute intracranial process.  2. ASPECTS is 10.    I independently interpreted and visualized the MR brain. My interpretation: No mass Radiology interpretation:  IMPRESSION:  1. The right superior cerebellar artery appears to be in close  proximity to the superior aspect of the right trigeminal  nerve,  which could be a cause of trigeminal neuralgia.  2. No acute intracranial process.       PROCEDURES:  Critical Care performed: No    MEDICATIONS ORDERED IN ED: Medications  sodium chloride flush (NS) 0.9 % injection 3 mL (has no administration in time range)  carbamazepine (TEGRETOL) tablet 200 mg (has no administration in time range)  LORazepam (ATIVAN) injection 1 mg (has no administration in time range)     IMPRESSION / MDM / ASSESSMENT AND PLAN / ED COURSE  I reviewed the triage vital signs and the nursing notes.                              Differential diagnosis includes, but is not limited to, CVA, trigeminal neuralgia  Patient's presentation is most consistent with acute presentation with potential threat to life or bodily function.   The patient is on the cardiac monitor to evaluate for evidence of arrhythmia and/or significant heart rate changes.  Patient presented to the emergency department today because of concerns for right face  pain, eye twitching and right hand numbness.  Patient was called a code stroke.  Dr. Selina Cooley with neurology evaluated the patient.  At this time she thinks trigeminal neuralgia most likely cause of the patient's symptoms.  Recommended medications and MRI to further evaluate.  Patient felt better after medication.  MRI without any mass.  Will plan on discharging on carbamazepine.  Will have patient follow-up with her neurologist.      FINAL CLINICAL IMPRESSION(S) / ED DIAGNOSES   Final diagnoses:  Trigeminal neuralgia      Note:  This document was prepared using Dragon voice recognition software and may include unintentional dictation errors.    Phineas Semen, MD 05/04/23 [phone]

## 2023-05-04 NOTE — Consult Note (Addendum)
NEUROLOGY CONSULTATION NOTE   Date of service: May 04, 2023 Patient Name: Paula [name] MRN:  [id number] DOB:  [birth year] Reason for consult: stroke code Requesting physician: Dr. Phineas Semen _ _ _   _ __   _ __ _ _  __ __   _ __   __ _  History of Present Illness   This is a 42 year old woman with a past medical history significant for migraine headaches which are typically accompanied by right facial droop.  Stroke code was called for right hemifacial tics, eye pain, and right eye droop.  On my examination she does not have a droop of her lower face only her right eyelid and this is typical for her with her migraines.  She has minimal weakness in her right upper extremity which she states is chronic.  Her stroke scale was 0.  TNK was not administered due to symptoms consistent with her stereotypical migraines and not with stroke as well as mild and nondisabling symptoms.  Initially she reported eyelid twitching however when I was speaking with her later she developed what repeated tics of her right face around her eye down to just above her mouth that were associated with paroxysms of sharp pain which she described as equal in intensity to the pain of labor with her children.  She has never had the painful tics before.   ROS   Per HPI: all other systems reviewed and are negative  Past History   I have reviewed the following:  Past Medical History:  Diagnosis Date   ADHD    Anemia    Asthma    exercise induced   BULIMIA 01/17/2008   Annotation: AGE 57 Qualifier: Diagnosis of  By: Lorrene Reid LPN, Wanda     Complication of anesthesia    vomited during last c/s   CPD (cephalo-pelvic disproportion) 12/22/2014   Cystic fibrosis carrier in second trimester, antepartum 05/29/2014   Depression    Eczema    GERD (gastroesophageal reflux disease)    chronic   Headache    h/o migraines   Hypothyroidism    Nausea    Oligomenorrhea 12/22/2014   PCOS (polycystic ovarian syndrome)     PONV (postoperative nausea and vomiting)    Rhinitis, allergic    Past Surgical History:  Procedure Laterality Date   CESAREAN SECTION  04/19/2013   LTCS; CPD   CESAREAN SECTION N/A 10/27/2015   Procedure: REPEAT CESAREAN SECTION;  Surgeon: Herold Harms, MD;  Location: [facility];  Service: Obstetrics;  Laterality: N/A;   ENDOSCOPIC TURBINATE REDUCTION Bilateral 07/26/2017   Procedure: ENDOSCOPIC INFERIOR  TURBINATE REDUCTION;  Surgeon: Vernie Murders, MD;  Location: [facility];  Service: ENT;  Laterality: Bilateral;   FRONTAL SINUS EXPLORATION Bilateral 07/26/2017   Procedure: FRONTAL SINUS EXPLORATION;  Surgeon: Vernie Murders, MD;  Location: [facility];  Service: ENT;  Laterality: Bilateral;   IMAGE GUIDED SINUS SURGERY Bilateral 07/26/2017   Procedure: IMAGE GUIDED SINUS SURGERY;  Surgeon: Vernie Murders, MD;  Location: [facility];  Service: ENT;  Laterality: Bilateral;  GAVE DISK TO CECE 2-12   SEPTOPLASTY WITH ETHMOIDECTOMY, AND MAXILLARY ANTROSTOMY Bilateral 07/26/2017   Procedure: SEPTOPLASTY WITH TOTAL  ETHMOIDECTOMY, AND MAXILLARY ANTROSTOMYWITH REMOVAL OF TISSUE,;  Surgeon: Vernie Murders, MD;  Location: [facility];  Service: ENT;  Laterality: Bilateral;   TONSILLECTOMY     WISDOM TOOTH EXTRACTION     Family History  Problem Relation Age of Onset   Endometriosis Mother  Bipolar disorder Mother    Alcohol abuse Mother    Heart disease Maternal Grandmother        valvular problem   Depression Maternal Grandmother    Breast cancer Neg Hx    Ovarian cancer Neg Hx    Colon cancer Neg Hx    Social History   Socioeconomic History   Marital status: Married    Spouse name: Not on file   Number of children: Not on file   Years of education: Not on file   Highest education level: Not on file  Occupational History   Occupation: ED tech    Employer: [employer]  Tobacco Use   Smoking status: Never   Smokeless tobacco: Never  Vaping Use    Vaping status: Never Used  Substance and Sexual Activity   Alcohol use: Yes    Alcohol/week: 2.0 standard drinks of alcohol    Types: 2 Standard drinks or equivalent per week    Comment: Social   Drug use: No   Sexual activity: Yes    Partners: Male    Birth control/protection: Pill  Other Topics Concern   Not on file  Social History Narrative   Not on file   Social Determinants of Health   Financial Resource Strain: Low Risk  (02/27/2023)   Received from [facility]   Overall Financial Resource Strain (CARDIA)    Difficulty of Paying Living Expenses: Not hard at all  Food Insecurity: No Food Insecurity (02/27/2023)   Received from [facility]   Hunger Vital Sign    Worried About Running Out of Food in the Last Year: Never true    Ran Out of Food in the Last Year: Never true  Transportation Needs: No Transportation Needs (02/27/2023)   Received from [facility] - [facility]    In the past 12 months, has lack of transportation kept you from medical appointments or from getting medications?: No    Lack of Transportation (Non-Medical): No  Physical Activity: Not on file  Stress: Not on file  Social Connections: Not on file   Allergies  Allergen Reactions   Iodine Anaphylaxis    Contrast    Shellfish Allergy Hives   Contrast Media [Iodinated Contrast Media]    Flagyl [Metronidazole]     Medications   (Not in a hospital admission)     Current Facility-Administered Medications:    cyanocobalamin ((VITAMIN B-12)) injection 1,000 mcg, 1,000 mcg, Intramuscular, Once, Doreene Burke, CNM  Current Outpatient Medications:    carbamazepine (TEGRETOL-XR) 100 MG 12 hr tablet, Take 1 tablet (100 mg total) by mouth 2 (two) times daily., Disp: 60 tablet, Rfl: 2   albuterol (PROVENTIL HFA;VENTOLIN HFA) 108 (90 Base) MCG/ACT inhaler, Inhale 2 puffs into the lungs every 6 (six) hours as needed for wheezing or  shortness of breath., Disp: , Rfl:    ARAZLO 0.045 % LOTN, Apply topically. (Patient not taking: Reported on 10/19/2022), Disp: , Rfl:    atorvastatin (LIPITOR) 40 MG tablet, Take 1 tablet (40 mg total) by mouth daily., Disp: 30 tablet, Rfl: 5   azelastine (OPTIVAR) 0.05 % ophthalmic solution, 1 drop into affected eye Ophthalmic Twice a day 30 days, Disp: 6 mL, Rfl: 3   dexmethylphenidate (FOCALIN XR) 15 MG 24 hr capsule, Take 1 capsule (15 mg total) by mouth daily., Disp: 30 capsule, Rfl: 0   dexmethylphenidate (FOCALIN XR) 15 MG 24 hr capsule, Take 1 capsule (15 mg total) by  mouth every morning., Disp: 30 capsule, Rfl: 0   [START ON 05/05/2023] dexmethylphenidate (FOCALIN XR) 15 MG 24 hr capsule, Take 1 capsule (15 mg total) by mouth every morning., Disp: 30 capsule, Rfl: 0   Drospirenone (SLYND) 4 MG TABS, Take 1 tablet (4 mg total) by mouth daily., Disp: 84 tablet, Rfl: 3   Eflornithine HCl 13.9 % cream, Apply a thin layer up to twice a day, at least 8 hours apart, Disp: 45 g, Rfl: 6   EPINEPHrine 0.3 mg/0.3 mL IJ SOAJ injection, as directed Injection prn 30 days, Disp: 2 each, Rfl: 1   EPSOLAY 5 % cream, Apply 1 Application topically every morning., Disp: , Rfl:    fluticasone (FLONASE) 50 MCG/ACT nasal spray, 1 spray each nostril Nasally Once a day 30 days, Disp: 16 g, Rfl: 3   gabapentin (NEURONTIN) 100 MG capsule, Take 1 capsule (100 mg total) by mouth 2 (two) times daily for 7 days, THEN 2 capsules (200 mg total) 2 (two) times daily and continue this dose., Disp: 120 capsule, Rfl: 3   levocetirizine (XYZAL) 5 MG tablet, Take 1 tablet (5 mg total) by mouth daily., Disp: 30 tablet, Rfl: 3   levothyroxine (SYNTHROID) 75 MCG tablet, Take 1 tablet (75 mcg total) by mouth daily before breakfast., Disp: 30 tablet, Rfl: 11   methylphenidate (RITALIN) 10 MG tablet, Take 1 tablet (10 mg total) by mouth daily at 12 noon., Disp: 30 tablet, Rfl: 0   methylphenidate (RITALIN) 10 MG tablet, Take 1 tablet (10  mg total) by mouth daily at 12 noon., Disp: 30 tablet, Rfl: 0   methylphenidate (RITALIN) 10 MG tablet, Take 1 tablet (10 mg total) by mouth daily before breakfast., Disp: 30 tablet, Rfl: 0   methylphenidate (RITALIN) 10 MG tablet, Take 1 tablet (10 mg total) by mouth daily before breakfast., Disp: 30 tablet, Rfl: 0   mometasone (ELOCON) 0.1 % cream, 1 application Externally Twice a day 30 days (Patient not taking: Reported on 10/19/2022), Disp: 90 g, Rfl: 1   montelukast (SINGULAIR) 10 MG tablet, Take 10 mg by mouth as needed., Disp: , Rfl:    spironolactone (ALDACTONE) 100 MG tablet, TAKE 2 TABLETS BY MOUTH DAILY (Patient not taking: Reported on 10/19/2022), Disp: 180 tablet, Rfl: 3   traZODone (DESYREL) 50 MG tablet, Take 1/4 to 1/2 tablet (12.5 - 25 mg) by mouth at night for sleep., Disp: 30 tablet, Rfl: 11   venlafaxine XR (EFFEXOR-XR) 75 MG 24 hr capsule, Take 3 capsules (225 mg total) by mouth daily with breakfast., Disp: 270 capsule, Rfl: 1  Vitals   Vitals:   05/04/23 1324 05/04/23 1329 05/04/23 1819  BP: (!) 167/101  (!) 180/96  Pulse: 88  74  Resp: 18  16  Temp:  98.9 F (37.2 C) 98.1 F (36.7 C)  TempSrc:  Oral Oral  SpO2: 100%  100%     There is no height or weight on file to calculate BMI.  Physical Exam   Physical Exam Gen: A&O x4, NAD HEENT: Atraumatic, normocephalic;mucous membranes moist; oropharynx clear, tongue without atrophy or fasciculations. Neck: Supple, trachea midline. Resp: CTAB, no w/r/r CV: RRR, no m/g/r; nml S1 and S2. 2+ symmetric peripheral pulses. Abd: soft/NT/ND; nabs x 4 quad Extrem: Nml bulk; no cyanosis, clubbing, or edema.  Neuro: *MS: A&O x4. Follows multi-step commands.  *Speech: fluid, nondysarthric, able to name and repeat *CN:    I: Deferred   II,III: PERRLA, VFF by confrontation, optic discs unable to  be visualized 2/2 pupillary constriction   III,IV,VI: EOMI w/o nystagmus, no ptosis   V: Sensation intact from V1 to V3 to LT    VII: Eyelid closure was full.  Smile symmetric.   VIII: Hearing intact to voice   IX,X: Voice normal, palate elevates symmetrically    XI: SCM/trap 5/5 bilat   XII: Tongue protrudes midline, no atrophy or fasciculations   *Motor:   Normal bulk.  No tremor, rigidity or bradykinesia. No pronator drift.    Strength: Dlt Bic Tri WrE WrF FgS Gr HF KnF KnE PlF DoF    Left 5 5 5 5 5 5 5 5 5 5 5 5     Right 5 5 5 5 5 5 5 5 5 5 5 5     *Sensory: Intact to light touch, pinprick, temperature vibration throughout. Symmetric. Propioception intact bilat.  No double-simultaneous extinction.  *Coordination:  Finger-to-nose, heel-to-shin, rapid alternating motions were intact. *Reflexes:  2+ and symmetric throughout without clonus; toes down-going bilat *Gait: deferred  NIHSS = 0   Premorbid mRS = 0   Labs   CBC:  Recent Labs  Lab 05/04/23 1329 05/04/23 1625  WBC 8.3 8.8  NEUTROABS  --  5.1  HGB 14.2 13.7  HCT 40.9 39.3  MCV 89.3 88.5  PLT 303 292    Basic Metabolic Panel:  Lab Results  Component Value Date   NA 137 05/04/2023   K 3.4 (L) 05/04/2023   CO2 25 05/04/2023   GLUCOSE 85 05/04/2023   BUN 16 05/04/2023   CREATININE 0.80 05/04/2023   CALCIUM 9.2 05/04/2023   GFRNONAA >60 05/04/2023   GFRAA >60 12/08/2019   Lipid Panel:  Lab Results  Component Value Date   LDLCALC 228 (H) 01/25/2023   HgbA1c:  Lab Results  Component Value Date   HGBA1C 5.1 01/25/2023   Urine Drug Screen:     Component Value Date/Time   COCAINSCRNUR Negative 11/09/2022 1516   LABBENZ Negative 03/31/2015 1139   AMPHETMU Negative 03/31/2015 1139   LABBARB Negative 11/09/2022 1516    Alcohol Level     Component Value Date/Time   ETH <10 05/04/2023 1615    CT Head without contrast: No acute process on personal review  Impression   This is a 42 yo woman who presents with involuntary right facial tics associated with severe shooting paroxysmal pain along the right trigeminal distribution  consistent with trigeminal neuralgia.  There is no concern for stroke.  Recommendations   - Carbamazepine 200mg  now, continue 100mg  bid - MRI brain wwo, if no significant findings can be discharged on carbamazepine to f/u with her outpatient neurologist as scheduled ______________________________________________________________________   Thank you for the opportunity to take part in the care of this patient. If you have any further questions, please contact the neurology consultation attending.  Signed,  Bing Neighbors, MD [facility] [phone]  If 7pm- 7am, please page neurology on call as listed in AMION.  **Any copied and pasted documentation in this note was written by me in another application not billed for and pasted by me into this document.  Addendum 05/16/23 in response to clarification of last known well. LKW 2 hours prior to presentation ~11:20AM.  Bing Neighbors, MD [facility] [phone]  If 7pm- 7am, please page neurology on call as listed in AMION.

## 2023-05-04 NOTE — ED Notes (Signed)
1531 Activation for pt in CT.  ED RN advises pt had worsening of condition and acute development of r side facial droop.  1532 Dr Selina Cooley in Ct to assess pt 1535 Cancelled by Dr Selina Cooley as she reports LKWT over 4.5 hours.

## 2023-05-08 DIAGNOSIS — F411 Generalized anxiety disorder: Secondary | ICD-10-CM | POA: Diagnosis not present

## 2023-05-08 DIAGNOSIS — F4310 Post-traumatic stress disorder, unspecified: Secondary | ICD-10-CM | POA: Diagnosis not present

## 2023-05-08 DIAGNOSIS — M25511 Pain in right shoulder: Secondary | ICD-10-CM | POA: Diagnosis not present

## 2023-05-08 DIAGNOSIS — G500 Trigeminal neuralgia: Secondary | ICD-10-CM | POA: Diagnosis not present

## 2023-05-08 DIAGNOSIS — F324 Major depressive disorder, single episode, in partial remission: Secondary | ICD-10-CM | POA: Diagnosis not present

## 2023-05-08 DIAGNOSIS — G8929 Other chronic pain: Secondary | ICD-10-CM | POA: Diagnosis not present

## 2023-05-10 ENCOUNTER — Other Ambulatory Visit: Payer: Self-pay

## 2023-05-10 DIAGNOSIS — M79601 Pain in right arm: Secondary | ICD-10-CM | POA: Diagnosis not present

## 2023-05-10 DIAGNOSIS — M79604 Pain in right leg: Secondary | ICD-10-CM | POA: Diagnosis not present

## 2023-05-10 DIAGNOSIS — R519 Headache, unspecified: Secondary | ICD-10-CM | POA: Diagnosis not present

## 2023-05-10 DIAGNOSIS — R200 Anesthesia of skin: Secondary | ICD-10-CM | POA: Diagnosis not present

## 2023-05-10 DIAGNOSIS — M542 Cervicalgia: Secondary | ICD-10-CM | POA: Diagnosis not present

## 2023-05-13 NOTE — Progress Notes (Signed)
Virtual Visit via Video Note  I connected with Paula [name] on 05/18/23 at 10:00 AM EST by a video enabled telemedicine application and verified that I am speaking with the correct person using two identifiers.  Location: Patient: car Provider: office Persons participated in the visit- patient, provider    I discussed the limitations of evaluation and management by telemedicine and the availability of in person appointments. The patient expressed understanding and agreed to proceed.   I discussed the assessment and treatment plan with the patient. The patient was provided an opportunity to ask questions and all were answered. The patient agreed with the plan and demonstrated an understanding of the instructions.   The patient was advised to call back or seek an in-person evaluation if the symptoms worsen or if the condition fails to improve as anticipated.  I provided 30 minutes of non-face-to-face time during this encounter.   Neysa Hotter, MD     [facility] MD/PA/NP OP Progress Note  05/18/2023 10:31 AM Paula [name]  MRN:  [id number]  Chief Complaint:  Chief Complaint  Patient presents with   Follow-up   HPI:  - According to the chart review, the following events have occurred since the last visit: The patient was seen by neurology for right facial pain. Dx with trigeminal neuralgia, and was started on carbamazepine.   This is a follow-up appointment for depression, PTSD and insomnia.  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  It has been fulfilling.  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 It warms her heart when one of the boy allows her to touch him. She has her time with [name]. She is doing Special educational needs teacher, and is planning to see her pediatric.  She thinks her focus has been good.  She sleeps well with trazodone.  She denies feeling depressed or anxiety.  She denies SI.  She feels comfortable to stay on the current  medication regimen.   Household:  Husband, 2 daughters Marital status: married Number of children: 2 ([name], age 70, Michelle Piper) She was [place]. Her parents were never married. She had estranged relationship with her biological father since she was a child.    Blood Pressure 144/88 05/10/2023 3:37 PM EST       Tobacco Alcohol Other substances/  Current denies On weekend, one drink denies  Past denies binge drinking, a bottle of wine , last in Jan 2024  denies  Past Treatment             Visit Diagnosis:    ICD-10-CM   1. PTSD (post-traumatic stress disorder)  F43.10     2. MDD (major depressive disorder), recurrent, in partial remission (HCC)  F33.41     3. GAD (generalized anxiety disorder)  F41.1     4. Attention deficit hyperactivity disorder (ADHD), unspecified ADHD type [F90.9]  F90.9     5. Insomnia, unspecified type  G47.00       Past Psychiatric History: Please see initial evaluation for full details. I have reviewed the history. No updates at this time.     Past Medical History:  Past Medical History:  Diagnosis Date   ADHD    Anemia    Asthma    exercise induced   BULIMIA 01/17/2008   Annotation: AGE 66 Qualifier: Diagnosis of  By: Lorrene Reid LPN, Wanda     Complication of anesthesia    vomited during last c/s   CPD (cephalo-pelvic  disproportion) 12/22/2014   Cystic fibrosis carrier in second trimester, antepartum 05/29/2014   Depression    Eczema    GERD (gastroesophageal reflux disease)    chronic   Headache    h/o migraines   Hypothyroidism    Nausea    Oligomenorrhea 12/22/2014   PCOS (polycystic ovarian syndrome)    PONV (postoperative nausea and vomiting)    Rhinitis, allergic     Past Surgical History:  Procedure Laterality Date   CESAREAN SECTION  04/19/2013   LTCS; CPD   CESAREAN SECTION N/A 10/27/2015   Procedure: REPEAT CESAREAN SECTION;  Surgeon: Herold Harms, MD;  Location: [facility] ORS;  Service: Obstetrics;  Laterality:  N/A;   ENDOSCOPIC TURBINATE REDUCTION Bilateral 07/26/2017   Procedure: ENDOSCOPIC INFERIOR  TURBINATE REDUCTION;  Surgeon: Vernie Murders, MD;  Location: [facility];  Service: ENT;  Laterality: Bilateral;   FRONTAL SINUS EXPLORATION Bilateral 07/26/2017   Procedure: FRONTAL SINUS EXPLORATION;  Surgeon: Vernie Murders, MD;  Location: [facility];  Service: ENT;  Laterality: Bilateral;   IMAGE GUIDED SINUS SURGERY Bilateral 07/26/2017   Procedure: IMAGE GUIDED SINUS SURGERY;  Surgeon: Vernie Murders, MD;  Location: [facility];  Service: ENT;  Laterality: Bilateral;  GAVE DISK TO CECE 2-12   SEPTOPLASTY WITH ETHMOIDECTOMY, AND MAXILLARY ANTROSTOMY Bilateral 07/26/2017   Procedure: SEPTOPLASTY WITH TOTAL  ETHMOIDECTOMY, AND MAXILLARY ANTROSTOMYWITH REMOVAL OF TISSUE,;  Surgeon: Vernie Murders, MD;  Location: [facility];  Service: ENT;  Laterality: Bilateral;   TONSILLECTOMY     WISDOM TOOTH EXTRACTION      Family Psychiatric History: Please see initial evaluation for full details. I have reviewed the history. No updates at this time.     Family History:  Family History  Problem Relation Age of Onset   Endometriosis Mother    Bipolar disorder Mother    Alcohol abuse Mother    Heart disease Maternal Grandmother        valvular problem   Depression Maternal Grandmother    Breast cancer Neg Hx    Ovarian cancer Neg Hx    Colon cancer Neg Hx     Social History:  Social History   Socioeconomic History   Marital status: Married    Spouse name: Not on file   Number of children: Not on file   Years of education: Not on file   Highest education level: Not on file  Occupational History   Occupation: ED tech    Employer: [employer]  Tobacco Use   Smoking status: Never   Smokeless tobacco: Never  Vaping Use   Vaping status: Never Used  Substance and Sexual Activity   Alcohol use: Yes    Alcohol/week: 2.0 standard drinks of alcohol    Types: 2 Standard drinks or  equivalent per week    Comment: Social   Drug use: No   Sexual activity: Yes    Partners: Male    Birth control/protection: Pill  Other Topics Concern   Not on file  Social History Narrative   Not on file   Social Drivers of Health   Financial Resource Strain: Low Risk  (05/08/2023)   Received from [facility]   Overall Financial Resource Strain (CARDIA)    Difficulty of Paying Living Expenses: Not hard at all  Food Insecurity: No Food Insecurity (05/08/2023)   Received from [facility]   Hunger Vital Sign    Worried About Running Out of Food in the Last Year: Never  true    Ran Out of Food in the Last Year: Never true  Transportation Needs: No Transportation Needs (05/08/2023)   Received from [facility] - Transportation    In the past 12 months, has lack of transportation kept you from medical appointments or from getting medications?: No    Lack of Transportation (Non-Medical): No  Physical Activity: Not on file  Stress: Not on file  Social Connections: Not on file    Allergies:  Allergies  Allergen Reactions   Iodine Anaphylaxis    Contrast    Shellfish Allergy Hives   Contrast Media [Iodinated Contrast Media]    Flagyl [Metronidazole]     Metabolic Disorder Labs: Lab Results  Component Value Date   HGBA1C 5.1 01/25/2023   Lab Results  Component Value Date   PROLACTIN 35.8 (H) 03/14/2016   PROLACTIN 43.4 12/27/2007   Lab Results  Component Value Date   CHOL 295 (H) 01/25/2023   TRIG 74 01/25/2023   HDL 55 01/25/2023   CHOLHDL 5.4 (H) 01/25/2023   VLDL 17 09/25/2011   LDLCALC 228 (H) 01/25/2023   LDLCALC 214 (H) 08/10/2021   Lab Results  Component Value Date   TSH 1.850 01/25/2023   TSH 1.040 09/05/2022    Therapeutic Level Labs: No results found for: "LITHIUM" No results found for: "VALPROATE" No results found for: "CBMZ"  Current Medications: Current Outpatient Medications   Medication Sig Dispense Refill   [START ON 06/09/2023] dexmethylphenidate (FOCALIN XR) 15 MG 24 hr capsule Take 1 capsule (15 mg total) by mouth daily. 30 capsule 0   [START ON 07/09/2023] dexmethylphenidate (FOCALIN XR) 15 MG 24 hr capsule Take 1 capsule (15 mg total) by mouth daily. 30 capsule 0   [START ON 08/08/2023] dexmethylphenidate (FOCALIN XR) 15 MG 24 hr capsule Take 1 capsule (15 mg total) by mouth daily at 12 noon. 30 capsule 0   traZODone (DESYREL) 50 MG tablet Take 0.5 tablets (25 mg total) by mouth at bedtime as needed for sleep. 45 tablet 0   albuterol (PROVENTIL HFA;VENTOLIN HFA) 108 (90 Base) MCG/ACT inhaler Inhale 2 puffs into the lungs every 6 (six) hours as needed for wheezing or shortness of breath.     ARAZLO 0.045 % LOTN Apply topically. (Patient not taking: Reported on 10/19/2022)     atorvastatin (LIPITOR) 40 MG tablet Take 1 tablet (40 mg total) by mouth daily. 30 tablet 5   azelastine (OPTIVAR) 0.05 % ophthalmic solution 1 drop into affected eye Ophthalmic Twice a day 30 days 6 mL 3   carbamazepine (TEGRETOL-XR) 100 MG 12 hr tablet Take 1 tablet (100 mg total) by mouth 2 (two) times daily. 60 tablet 2   dexmethylphenidate (FOCALIN XR) 15 MG 24 hr capsule Take 1 capsule (15 mg total) by mouth daily. 30 capsule 0   dexmethylphenidate (FOCALIN XR) 15 MG 24 hr capsule Take 1 capsule (15 mg total) by mouth every morning. 30 capsule 0   dexmethylphenidate (FOCALIN XR) 15 MG 24 hr capsule Take 1 capsule (15 mg total) by mouth every morning. 30 capsule 0   Drospirenone (SLYND) 4 MG TABS Take 1 tablet (4 mg total) by mouth daily. 84 tablet 3   Eflornithine HCl 13.9 % cream Apply a thin layer up to twice a day, at least 8 hours apart 45 g 6   EPINEPHrine 0.3 mg/0.3 mL IJ SOAJ injection as directed Injection prn 30 days 2 each 1   EPSOLAY 5 % cream  Apply 1 Application topically every morning.     fluticasone (FLONASE) 50 MCG/ACT nasal spray 1 spray each nostril Nasally Once a day 30  days 16 g 3   gabapentin (NEURONTIN) 100 MG capsule Take 1 capsule (100 mg total) by mouth 2 (two) times daily for 7 days, THEN 2 capsules (200 mg total) 2 (two) times daily and continue this dose. 120 capsule 3   levocetirizine (XYZAL) 5 MG tablet Take 1 tablet (5 mg total) by mouth daily. 30 tablet 3   levothyroxine (SYNTHROID) 75 MCG tablet Take 1 tablet (75 mcg total) by mouth daily before breakfast. 30 tablet 11   methylphenidate (RITALIN) 10 MG tablet Take 1 tablet (10 mg total) by mouth daily before breakfast. 30 tablet 0   [START ON 05/30/2023] methylphenidate (RITALIN) 10 MG tablet Take 1 tablet (10 mg total) by mouth daily before breakfast. 30 tablet 0   [START ON 06/29/2023] methylphenidate (RITALIN) 10 MG tablet Take 1 tablet (10 mg total) by mouth daily at 12 noon. 30 tablet 0   [START ON 07/29/2023] methylphenidate (RITALIN) 10 MG tablet Take 1 tablet (10 mg total) by mouth daily at 12 noon. 30 tablet 0   mometasone (ELOCON) 0.1 % cream 1 application Externally Twice a day 30 days (Patient not taking: Reported on 10/19/2022) 90 g 1   montelukast (SINGULAIR) 10 MG tablet Take 10 mg by mouth as needed.     spironolactone (ALDACTONE) 100 MG tablet TAKE 2 TABLETS BY MOUTH DAILY (Patient not taking: Reported on 10/19/2022) 180 tablet 3   [START ON 06/25/2023] venlafaxine XR (EFFEXOR-XR) 75 MG 24 hr capsule Take 3 capsules (225 mg total) by mouth daily with breakfast. 270 capsule 1   Current Facility-Administered Medications  Medication Dose Route Frequency Provider Last Rate Last Admin   cyanocobalamin ((VITAMIN B-12)) injection 1,000 mcg  1,000 mcg Intramuscular Once Doreene Burke, CNM         Musculoskeletal: Strength & Muscle Tone:  N/A Gait & Station:  N/A Patient leans: N/A  Psychiatric Specialty Exam: Review of Systems  Psychiatric/Behavioral:  Negative for agitation, behavioral problems, confusion, decreased concentration, dysphoric mood, hallucinations, self-injury, sleep  disturbance and suicidal ideas. The patient is not nervous/anxious and is not hyperactive.   All other systems reviewed and are negative.   There were no vitals taken for this visit.There is no height or weight on file to calculate BMI.  General Appearance: Fairly Groomed  Eye Contact:  Good  Speech:  Clear and Coherent  Volume:  Normal  Mood:   good  Affect:  Appropriate, Congruent, and Full Range  Thought Process:  Coherent  Orientation:  Full (Time, Place, and Person)  Thought Content: Logical   Suicidal Thoughts:  No  Homicidal Thoughts:  No  Memory:  Immediate;   Good  Judgement:  Good  Insight:  Good  Psychomotor Activity:  Normal  Concentration:  Concentration: Good and Attention Span: Good  Recall:  Good  Fund of Knowledge: Good  Language: Good  Akathisia:  No  Handed:  Right  AIMS (if indicated): not done  Assets:  Communication Skills Desire for Improvement  ADL's:  Intact  Cognition: WNL  Sleep:  Good   Screenings: GAD-7    Flowsheet Row Office Visit from 03/06/2023 in [facility] Office Visit from 06/16/2020 in [facility] Office Visit from 04/28/2020 in [facility]  Total GAD-7 Score 0 7 8      PHQ2-9  Flowsheet Row Office Visit from 03/06/2023 in [facility] Office Visit from 11/09/2022 in [facility] Office Visit from 10/20/2021 in [facility] Video Visit from 07/27/2021 in [facility] Video Visit from 03/30/2021 in [facility]  PHQ-2 Total Score 0 0 0 1 0  PHQ-9 Total Score -- -- 1 -- --      Flowsheet Row ED from 05/04/2023 in [facility] Video Visit from 03/30/2021 in [facility] Video Visit from 11/10/2020 in [facility]  C-SSRS RISK CATEGORY No Risk No Risk No Risk        Assessment and Plan:  ASIA PSOMAS is a 42 y.o. year old female with a history of PTSD, depression, anxiety, GERD, hypothyroidism, PCOS, who presents for follow up appointment for below.   1. PTSD (post-traumatic stress disorder) 2. MDD (major depressive disorder), recurrent, in partial remission (HCC) 3. GAD (generalized anxiety disorder) Acute stressors include: conflict with her daughter, occasional marital conflict, recent overdose of her mother, concern about her grandfather with memory loss  Other stressors include: childhood trauma, neck pain   He denies any significant mood symptoms since the last visit.  Will continue current dose of venlafaxine to target PTSD, anxiety.   4. Attention deficit hyperactivity disorder (ADHD), unspecified ADHD type [F90.9] - neuropsych testing on 10/26/2022. Dx- ADHD, combined presentation, severe. UDS negative  10/2022   Stable.  She reports significant benefit since switching from Concerta, and experiences less migraine.  Will continue current dose of Focalin along with Ritalin to target ADHD.   # Insomnia Improving.  Will continue trazodone as needed for insomnia.   # Hypertension She feels the ED in the setting of hypertension, and trigeminal neuralgia.  Blood pressure was within acceptable range on recent visit with her neurologist.  Discussed possible risk of hypertension from venlafaxine, Focalin and Ritalin, although her blood pressure has been in good control until recently.  She agrees to monitor home BP.  She expressed understanding that medication regimen will be changed if she has hypertension due to possible adverse reaction on blood pressure.    # Alcohol use Improving.  She denies any recent episode of binge drinking. She denies any craving for alcohol. Will continue to assess.    Plan - she would like all meds to be sent to  [facility] Continue venlafaxine 225 mg daily Continue Trazodone 12.5 mg at night as needed for insomnia- drowsiness from higher dose Continue Focalin 15 mg daily Continue Ritalin 10 mg daily  Next appointment: 3/17 at 8 am for 30 mins, IP     Past trials of medication: sertraline, fluoxetine (nightmares), L-theanine , Ambien, Trazodone   The patient demonstrates the following risk factors for suicide: Chronic risk factors for suicide include: psychiatric disorder of depression, anxiety. Acute risk factors for suicide include: N/A. Protective factors for this patient include: positive social support, coping skills and hope for the future. Considering these factors, the overall suicide risk at this point appears to be low. Patient is appropriate for outpatient follow up.    Collaboration of Care: Collaboration of Care: Other reviewed notes in Epic  Patient/Guardian was advised Release of Information must be obtained prior to any record release in order to collaborate their care with an outside provider. Patient/Guardian was advised if they have not already done so  to contact the registration department to sign all necessary forms in order for [place] to release information regarding their care.   Consent: Patient/Guardian gives verbal consent for treatment and assignment of benefits for services provided during this visit. Patient/Guardian expressed understanding and agreed to proceed.    Neysa Hotter, MD 05/18/2023, 10:31 AM

## 2023-05-18 ENCOUNTER — Telehealth (INDEPENDENT_AMBULATORY_CARE_PROVIDER_SITE_OTHER): Payer: 59 | Admitting: Psychiatry

## 2023-05-18 ENCOUNTER — Encounter: Payer: Self-pay | Admitting: Psychiatry

## 2023-05-18 DIAGNOSIS — F411 Generalized anxiety disorder: Secondary | ICD-10-CM

## 2023-05-18 DIAGNOSIS — F3341 Major depressive disorder, recurrent, in partial remission: Secondary | ICD-10-CM

## 2023-05-18 DIAGNOSIS — F4310 Post-traumatic stress disorder, unspecified: Secondary | ICD-10-CM | POA: Diagnosis not present

## 2023-05-18 DIAGNOSIS — F909 Attention-deficit hyperactivity disorder, unspecified type: Secondary | ICD-10-CM

## 2023-05-18 DIAGNOSIS — G4700 Insomnia, unspecified: Secondary | ICD-10-CM | POA: Diagnosis not present

## 2023-05-18 MED ORDER — DEXMETHYLPHENIDATE HCL ER 15 MG PO CP24: 15.0000 mg | ORAL_CAPSULE | Freq: Every day | ORAL | 0 refills | Status: DC

## 2023-05-18 MED ORDER — VENLAFAXINE HCL ER 75 MG PO CP24: 225.0000 mg | ORAL_CAPSULE | Freq: Every day | ORAL | 1 refills | Status: DC

## 2023-05-18 MED ORDER — METHYLPHENIDATE HCL 10 MG PO TABS: 10.0000 mg | ORAL_TABLET | Freq: Every day | ORAL | 0 refills | Status: DC

## 2023-05-18 MED ORDER — TRAZODONE HCL 50 MG PO TABS: 25.0000 mg | ORAL_TABLET | Freq: Every evening | ORAL | 0 refills | Status: DC | PRN

## 2023-05-18 NOTE — Patient Instructions (Signed)
Continue venlafaxine 225 mg daily Continue Trazodone 12.5 mg at night as needed for insomnia Continue Focalin 15 mg daily Continue Ritalin 10 mg daily  Next appointment: 3/17 at 8 am

## 2023-05-21 ENCOUNTER — Encounter: Payer: MEDICAID | Attending: Student in an Organized Health Care Education/Training Program

## 2023-05-22 NOTE — Telephone Encounter (Signed)
Called patient to make aware that Dr Vanetta Shawl is currently out of the office and she should call back when she returns to discuss medication changes

## 2023-06-01 ENCOUNTER — Other Ambulatory Visit: Payer: Self-pay | Admitting: Certified Nurse Midwife

## 2023-06-01 MED ORDER — LEVOTHYROXINE SODIUM 75 MCG PO TABS: 75.0000 ug | ORAL_TABLET | Freq: Every day | ORAL | 11 refills | Status: AC

## 2023-06-04 ENCOUNTER — Ambulatory Visit
Admit: 2023-06-04 | Discharge: 2023-06-04 | Payer: MEDICAID | Attending: Student in an Organized Health Care Education/Training Program | Admitting: Student in an Organized Health Care Education/Training Program

## 2023-06-04 VITALS — BP 116/78 | HR 84 | Ht 64.0 in | Wt 196.0 lb

## 2023-06-04 DIAGNOSIS — Z01419 Encounter for gynecological examination (general) (routine) without abnormal findings: Principal | ICD-10-CM

## 2023-06-04 NOTE — Progress Notes (Signed)
 OB/GYN Annual Visit  [facility]     Olivia Castro  06/04/2023                       Primary Care Physician: No primary care provider on file.    CC:   Chief Complaint   Patient presents with    Annual Exam         HPI: Olivia Castro is a 43 y.o. female [phone] here for an annual visit. She has no complaints.     Patient's last menstrual period was 06/01/2023 (exact date). She reports regular periods lasting about 5 days. The first three days are heavy and then they lighten up. She has some mild cramps. She is not interested in medical management for these at this time.    Her bowel habits are regular. She denies any bloating.  She denies dysuria. She denies urinary leaking.  She denies vaginal discharge.  She is not sexually active currently. She denies any breast complaints.    Depression Screen: Symptoms of decreased mood absent  Symptoms of anhedonia absent    REVIEW OF SYSTEMS:   Constitutional: negative fever, negative chills, negative weight changes   HEENT: negative visual disturbances, negative headaches, negative dizziness, negative hearing loss  Breast: Negative breast abnormalities, negative breast lumps, negative nipple discharge  Respiratory: negative dyspnea, negative cough, negative SOB  Cardiovascular: negative chest pain,  negative palpitations, negative arrhythmia, negative syncope   Gastrointestinal: negative abdominal pain, negative RUQ pain, negative N/V, negative diarrhea, negative constipation, negative bowel changes, negative heartburn   Genitourinary: negative pelvic pain, negative dysuria, negative hematuria, negative urinary incontinence, negative vaginal discharge, positive vaginal bleeding  Dermatological: negative rash, negative pruritis, negative mole or other skin changes  Hematologic: negative bruising  Immunologic/Lymphatic: negative recent illness, negative recent sick contact  Musculoskeletal: negative back pain, negative myalgias, negative  arthralgias  Neurological:  negative dizziness, negative migraines, negative seizures, negative weakness  Behavior/Psych: negative depression, negative anxiety, negative SI, negative HI    ________________________________________________________________________    GYNECOLOGICAL HISTORY:  Age of Menarche: 14  Age of Menopause: n/a     Sexually Active: not currently sexually active  STD History: denies    Pap History: NILM, HPV neg 07/14/2020  Hx Abnormal Pap: denies    Permanent Sterilization: no  Reversible Birth Control: no    HEALTH MAINTENANCE:  Gardasil immunization: discussed  Date of Last Mammogram: patient has never had a mammogram, she has no interest in screening until age 43  Date of Last Colonoscopy: n/a    OBSTETRICAL HISTORY:  OB History   Gravida Para Term Preterm AB Living   3 1 1  0 2 1   SAB IAB Ectopic Molar Multiple Live Births   0 0 2 0 0 1      # Outcome Date GA Lbr Len/2nd Weight Sex Type Anes PTL Lv   3 Term 2000    F Vag-Spont   LIV   2 Ectopic            1 Ectopic                PAST MEDICAL HISTORY:   has a past medical history of Hypothyroid.    PAST SURGICAL HISTORY:   has a past surgical history that includes Ectopic pregnancy surgery.    ALLERGIES:  is allergic to iodine.    MEDICATIONS:  Prior to Admission medications    Medication Sig Start Date End  Date Taking? Authorizing Provider   levothyroxine (SYNTHROID) 75 MCG tablet  08/14/22  Yes [provider]       FAMILY HISTORY:  Family History of Breast, Ovarian, Colon or Uterine Cancer: Yes   Paternal aunt with breast cancer in 40s   family history includes Breast Cancer in her paternal aunt; Diabetes in her father and mother; High Blood Pressure in her father and mother; Hypertension in her father and mother; Lung Cancer in her maternal grandmother; Prostate Cancer in her father.    SOCIAL HISTORY:   reports that she has never smoked. She has never used smokeless tobacco. She reports that she does not drink alcohol and does not  use drugs.      VITALS:  Vitals:    06/04/23 0853   BP: 116/78   Site: Left Upper Arm   Position: Sitting   Cuff Size: Medium Adult   Pulse: 84   Weight: 88.9 kg (196 lb)   Height: 1.626 m (5' 4)                                                                                                                                                                          PHYSICAL EXAM:   General Appearance: Appears healthy.  Alert; in no acute distress.  Pleasant.  Skin: Skin color, texture, turgor normal. No rashes or lesions.  HEENT: normocephalic and atraumatic  Respiratory: no conversational dyspnea  Cardiovascular: normal rate and regular rhythm  Breast:  (Chest): normal appearance, no masses or tenderness, No nipple retraction or dimpling, No nipple discharge or bleeding, No axillary or supraclavicular adenopathy  Abdomen: soft, non-tender, non-distended, no right upper quadrant tenderness, and no CVA tenderness  Pelvic Exam:   Chaperone for Intimate Exam: Chaperone was offered and declined  External genitalia: normal general appearance without lesions  Urinary system: urethral meatus normal, bladder nontender  Vaginal: normal mucosa, dark blood in vaginal vault  Cervix: normal appearance without discharge or lesions, no CMT, small nabothian cyst on anterior cervix  Adnexa: nontender, no masses  Uterus: normal single, mobile, nontender  Musculoskeletal: no gross abnormalities  Extremities: non-tender BLE and non-edematous  Psych:  oriented to time, place and person, mood and affect are within normal limits     ASSESSMENT & PLAN:    Olivia Castro is a 43 y.o. female [phone] for annual well woman exam   - Vitals stable  - Gardasil vaccine: discussed, pt requesting to initiate today  - Last pap smear: NILM, HPV neg 06/2020, collected today at patient request  - Last mammogram: n/a, ordered, pt reports she doesn't want to complete until age 77  - Last colonoscopy: n/a   - Family Planning: n/a, not currently sexually  active   - Pelvic cultures declined   - Family history reviewed   - Recommend continued yearly well woman exams    Patient Active Problem List    Diagnosis Date Noted    History of ectopic pregnancy x2 10/18/2022    Family history of breast cancer 07/12/2020       Return in about 1 year (around 06/03/2024) for well woman exam, needs HPV 2 and 3 scheduled.    No Patient Care Coordination Note on file.      Counseling Completed:    Counseled about need for pap smears as per American Society for Colposcopy and Cervical Pathology guidelines.  Counseled about need for mammograms every year, If >41 yo and last mammogram was negative.  Counseled about birth control.  Counseled about STD counseling and prevention.  Counseled about Gardasil counseling for all patients 61-45 yo.  Counseled about Hereditary Breast, Ovarian, Colon and Uterine Cancer screening.  Routine health maintenance per patients PCP discussed.    Patient was seen with total face to face time of 20 minutes. More than 50% of this visit was on counseling and education regarding the problems listed below and her options. She was also counseled on her preventative health maintenance recommendations and follow-up.   Diagnosis Orders   1. Well woman exam        2. Pap smear, as part of routine gynecological examination  PAP SMEAR      3. Screening for human papillomavirus  PAP SMEAR      4. Screening mammogram for breast cancer  MAM DIGITAL SCREEN W OR WO CAD BILATERAL    PAP SMEAR      5. Need for HPV vaccine  HPV, GARDASIL 9, (age 24-45 yrs), IM    IMMUNIZ ADMIN,1 SINGLE/COMB VAC/TOXOID           Joesph FORBES Banks, DO  [facility]  06/04/2023, 9:26 AM

## 2023-06-06 LAB — HUMAN PAPILLOMAVIRUS (HPV) DNA PROBE THIN PREP HIGH RISK
HPV, Genotype 16: NOT DETECTED
HPV, Genotype 18: NOT DETECTED
HPV, High Risk Other: NOT DETECTED

## 2023-06-12 LAB — GYN CYTOLOGY

## 2023-07-08 ENCOUNTER — Other Ambulatory Visit: Payer: Self-pay

## 2023-07-08 ENCOUNTER — Emergency Department
Admission: EM | Admit: 2023-07-08 | Discharge: 2023-07-08 | Disposition: A | Discharge status: Home / Self Care | Payer: BC Managed Care – PPO | Attending: Emergency Medicine | Admitting: Emergency Medicine

## 2023-07-08 DIAGNOSIS — G500 Trigeminal neuralgia: Secondary | ICD-10-CM | POA: Diagnosis not present

## 2023-07-08 DIAGNOSIS — M62838 Other muscle spasm: Secondary | ICD-10-CM

## 2023-07-08 DIAGNOSIS — R519 Headache, unspecified: Secondary | ICD-10-CM | POA: Diagnosis present

## 2023-07-08 DIAGNOSIS — F419 Anxiety disorder, unspecified: Secondary | ICD-10-CM | POA: Insufficient documentation

## 2023-07-08 HISTORY — DX: Trigeminal neuralgia: G50.0

## 2023-07-08 LAB — COMPREHENSIVE METABOLIC PANEL
ALT: 63 U/L — ABNORMAL HIGH (ref 0–44)
AST: 37 U/L (ref 15–41)
Albumin: 4.4 g/dL (ref 3.5–5.0)
Alkaline Phosphatase: 72 U/L (ref 38–126)
Anion gap: 11 (ref 5–15)
BUN: 15 mg/dL (ref 6–20)
CO2: 25 mmol/L (ref 22–32)
Calcium: 9.4 mg/dL (ref 8.9–10.3)
Chloride: 101 mmol/L (ref 98–111)
Creatinine, Ser: 0.8 mg/dL (ref 0.44–1.00)
GFR, Estimated: >60 mL/min (ref >60)
Glucose, Bld: 102 mg/dL — ABNORMAL HIGH (ref 70–99)
Potassium: 5 mmol/L (ref 3.5–5.1)
Sodium: 137 mmol/L (ref 135–145)
Total Bilirubin: 0.5 mg/dL (ref 0.0–1.2)
Total Protein: 7.8 g/dL (ref 6.5–8.1)

## 2023-07-08 LAB — URINALYSIS, COMPLETE (UACMP) WITH MICROSCOPIC
Bilirubin Urine: NEGATIVE
Glucose, UA: NEGATIVE mg/dL
Hgb urine dipstick: NEGATIVE
Ketones, ur: NEGATIVE mg/dL
Leukocytes,Ua: NEGATIVE
Nitrite: NEGATIVE
Protein, ur: NEGATIVE mg/dL
Specific Gravity, Urine: 1.016 (ref 1.005–1.030)
pH: 5 (ref 5.0–8.0)

## 2023-07-08 LAB — CBC
HCT: 44.2 % (ref 36.0–46.0)
Hemoglobin: 15.2 g/dL — ABNORMAL HIGH (ref 12.0–15.0)
MCH: 30.6 pg (ref 26.0–34.0)
MCHC: 34.4 g/dL (ref 30.0–36.0)
MCV: 89.1 fL (ref 80.0–100.0)
Platelets: 279 10*3/uL (ref 150–400)
RBC: 4.96 MIL/uL (ref 3.87–5.11)
RDW: 11.9 % (ref 11.5–15.5)
WBC: 5.3 10*3/uL (ref 4.0–10.5)
nRBC: 0 % (ref 0.0–0.2)

## 2023-07-08 LAB — POC URINE PREG, ED: Preg Test, Ur: NEGATIVE

## 2023-07-08 MED ORDER — LORAZEPAM 1 MG PO TABS: 1.0000 mg | ORAL_TABLET | Freq: Every day | ORAL | 0 refills | Status: AC | PRN

## 2023-07-08 MED ORDER — SODIUM CHLORIDE 0.9 % IV SOLN
1.5000 mg/kg | Freq: Once | INTRAVENOUS | Status: DC
  Filled 2023-07-08: qty 5.5

## 2023-07-08 MED ORDER — METOCLOPRAMIDE HCL 5 MG/ML IJ SOLN
10.0000 mg | Freq: Once | INTRAMUSCULAR | Status: AC
  Administered 2023-07-08: 10 mg via INTRAVENOUS
  Filled 2023-07-08: qty 2

## 2023-07-08 MED ORDER — SODIUM CHLORIDE 0.9 % IV BOLUS
1000.0000 mL | Freq: Once | INTRAVENOUS | Status: AC
  Administered 2023-07-08: 1000 mL via INTRAVENOUS

## 2023-07-08 MED ORDER — DIPHENHYDRAMINE HCL 50 MG/ML IJ SOLN
25.0000 mg | Freq: Once | INTRAMUSCULAR | Status: AC
  Administered 2023-07-08: 25 mg via INTRAVENOUS
  Filled 2023-07-08: qty 1

## 2023-07-08 MED ORDER — KETOROLAC TROMETHAMINE 30 MG/ML IJ SOLN
30.0000 mg | Freq: Once | INTRAMUSCULAR | Status: AC
  Administered 2023-07-08: 30 mg via INTRAVENOUS
  Filled 2023-07-08: qty 1

## 2023-07-08 MED ORDER — LORAZEPAM 2 MG/ML IJ SOLN
1.0000 mg | Freq: Once | INTRAMUSCULAR | Status: AC
Start: 2023-07-08 — End: 2023-07-08
  Administered 2023-07-08: 1 mg via INTRAVENOUS
  Filled 2023-07-08: qty 1

## 2023-07-08 NOTE — ED Triage Notes (Signed)
 See first nurse note. Hx trigeminal neuralgia with similar episodes, last episode 3 weeks ago and then yesterday and today. Whole R side tenses up including tongue. Pt alert and aware. Muscle spasm lasted 3 minutes arm leg and face and tongue, IV and labs done. Pt recently increased from 100mg  to 200mg  carbamazepine . Last time pt had this happen, had code stroke workup. Episode resolved during triage, spasms gone, speech clear.

## 2023-07-08 NOTE — Discharge Instructions (Addendum)
 As we discussed please follow-up with your neurologist for recheck/reevaluation as soon as you are able.  Please take your prescribed medication if needed for a more significant event to see if this helps with your symptoms.  Return to the emergency department for any symptom personally concerning to yourself.

## 2023-07-08 NOTE — ED Triage Notes (Signed)
 Pt comes via EMs from home with c/o facial spasm. Pt has hx of trigeminal neuralga. Pt has right sided face and arm cramping up. Pt has no focal deficits. Pt states this last for about 3-5 minutes.   VSS

## 2023-07-08 NOTE — ED Provider Notes (Signed)
 [facility] Provider Note    Event Date/Time   First MD Initiated Contact with Patient 07/08/23 1215     (approximate)  History   Chief Complaint: Spasms  HPI  Paula [name] is a 43 y.o. female with a past medical history of anemia, anxiety, gastric reflux, trigeminal neuralgia, presents to the emergency department with right facial pain headache and full body spasms.  According to the patient she was recently diagnosed several years ago with trigeminal neuralgia, since that time she has been experiencing intermittent symptoms of right facial pain and headaches.  Patient is prescribed carbamazepine  her doctor recently increased this medication to try to reduce the amount of trigeminal neuralgia episodes patient is experiencing.  Patient states more recently she has been experiencing full body spasms which she states she feels like her muscles on her right side and sometimes on her left side will contract and spasm and she is unable to control them.  Patient's family member who is here with her states often times she will hyperventilate causing the contractions to worsen.  Patient does admit to feeling anxious/worried when these events occur.  Patient was seen approximate 6 weeks ago for similar event and had an MRI with without contrast at that time that was negative for any acute abnormality.  Patient has had a significant workup in the past for the similar events but likely to be triggered from trigeminal neuralgia.  Patient states today's event was the worst that she has experienced yet so she came to the emergency department for evaluation.  Physical Exam   Triage Vital Signs: ED Triage Vitals  Encounter Vitals Group     BP 07/08/23 1136 (!) 177/116     Systolic BP Percentile --      Diastolic BP Percentile --      Pulse Rate 07/08/23 1136 71     Resp 07/08/23 1136 18     Temp 07/08/23 1136 98.2 F (36.8 C)     Temp src --      SpO2 07/08/23 1136 100 %      Weight 07/08/23 1124 163 lb (73.9 kg)     Height 07/08/23 1124 4' 11 (1.499 m)     Head Circumference --      Peak Flow --      Pain Score 07/08/23 1121 5     Pain Loc --      Pain Education --      Exclude from Growth Chart --     Most recent vital signs: Vitals:   07/08/23 1136  BP: (!) 177/116  Pulse: 71  Resp: 18  Temp: 98.2 F (36.8 C)  SpO2: 100%    General: Awake, moderately anxious in appearance. CV:  Good peripheral perfusion.  Regular rate and rhythm  Resp:  Normal effort.  Equal breath sounds bilaterally.  Abd:  No distention.  Soft, nontender.  No rebound or guarding. Other:  Equal grip strength bilaterally, no pronator drift.  Cranial nerves intact.  5/5 motor in extremities.   ED Results / Procedures / Treatments   MEDICATIONS ORDERED IN ED: Medications  LORazepam  (ATIVAN ) injection 1 mg (1 mg Intravenous Given 07/08/23 1240)  ketorolac  (TORADOL ) 30 MG/ML injection 30 mg (30 mg Intravenous Given 07/08/23 1238)  metoCLOPramide  (REGLAN ) injection 10 mg (10 mg Intravenous Given 07/08/23 1241)  diphenhydrAMINE  (BENADRYL ) injection 25 mg (25 mg Intravenous Given 07/08/23 1237)  sodium chloride  0.9 % bolus 1,000 mL (1,000 mLs Intravenous New Bag/Given 07/08/23  1235)     IMPRESSION / MDM / ASSESSMENT AND PLAN / ED COURSE  I reviewed the triage vital signs and the nursing notes.  Patient's presentation is most consistent with acute presentation with potential threat to life or bodily function.  Patient presents to the emergency department for right-sided facial pain headache followed by full body spasms.  Patient is quite anxious throughout my examination.  Patient has a known history of trigeminal neuralgia for which she takes carbamazepine .  The symptoms of full body contractions/spasms appear to be more likely related to the patient's hyperventilation and anxiety likely triggered by her trigeminal neuralgia and then exacerbated by more of a panic response.  Patient  also concerned that her blood pressure is elevated 177/116.  Patient denies any chest pain.  Denies any fever or recent illness.  Patient's lab work is reassuring in the emergency department normal CBC, reassuring chemistry, normal urinalysis negative pregnancy test.  I ordered a migraine cocktail of Toradol , Reglan , Benadryl  as well as IV Ativan  and fluids for the patient.  I spoke to Dr. Michaela of neurology who recommends a trial of IV lidocaine  if this fails to relieve the patient's trigeminal neuralgia pain.  I spoke to the patient approximately 1.5 hours after medications she is tired from the medication but states no longer having any head or facial pain.  No longer appears anxious, states all of her symptoms have largely resolved.  Patient's blood pressure is down 127/73 on recheck.  Given the patient's recent MRI with without contrast given her reassuring workup today as she already has follow-up with Dr. Lane on an outpatient basis I believe the patient is safe for discharge home.  I highly suspect the patient is having more of a panic response/hyperventilation/hypocarbia of event leading to her muscle contraction/spasms likely in response to the patient's trigeminal neuralgia that is flaring up from time to time.  Given the patient's response to the medications today I believe a trial of Ativan  would be warranted for the patient to be used only if needed for more significant events.  Patient states she only has a more significant event once every couple weeks.  Patient will follow-up with her neurologist.  I discussed return precautions with the patient as well as obtaining plenty of rest today.  Patient agreeable to plan of care.  FINAL CLINICAL IMPRESSION(S) / ED DIAGNOSES   Trigeminal neuralgia Muscle spasm Anxiety  Note:  This document was prepared using Dragon voice recognition software and may include unintentional dictation errors.   Dorothyann Drivers, MD 07/08/23 1420

## 2023-07-15 ENCOUNTER — Other Ambulatory Visit: Payer: Self-pay

## 2023-07-16 ENCOUNTER — Other Ambulatory Visit: Payer: Self-pay

## 2023-08-08 ENCOUNTER — Other Ambulatory Visit: Payer: Self-pay

## 2023-08-10 NOTE — Progress Notes (Signed)
 Virtual Visit via Video Note  I connected with Paula [name] on 08/13/23 at  8:00 AM EDT by a video enabled telemedicine application and verified that I am speaking with the correct person using two identifiers.  Location: Patient: work Provider: office Persons participated in the visit- patient, provider    I discussed the limitations of evaluation and management by telemedicine and the availability of in person appointments. The patient expressed understanding and agreed to proceed.    I discussed the assessment and treatment plan with the patient. The patient was provided an opportunity to ask questions and all were answered. The patient agreed with the plan and demonstrated an understanding of the instructions.   The patient was advised to call back or seek an in-person evaluation if the symptoms worsen or if the condition fails to improve as anticipated.    Paula Hotter, MD       [facility] MD/PA/NP OP Progress Note  08/13/2023 10:55 AM Paula [name]  MRN:  [id number]  Chief Complaint: No chief complaint on file.  HPI:  According to the chart review, the following events have occurred since the last visit: The patient was seen at ED for right sided facial pain, r/o trigeminal neuralgia.  She was seen by Dr. Malvin Johns in Feb. Per chart, "symptoms most consistent with side effects from Focalin vs dystonia vs functional neurological symptoms. ".  She was started on carbamazepine, gabapentin and clonazepam.   This is a follow-up appointment for PTSD, depression, anxiety and ADHD.  She states that she is scheduled to have an EEG as they suspect seizure.  She is not taking Focalin since Feb based on their guidance.  She is not taking the gabapentin as she is concerned it may worsen depression.  She is concerned about family history of dementia, including her grandfather.  Provided psycho education that there is no established risk of worsening in depression, or dementia, although  it can certainly cause drowsiness.  She is also not taking carbamazepine due to concern of weight gain.  She takes clonazepam only at night as it causes drowsiness.  However, she has been taking trazodone 200 mg due to worsening in insomnia.  Her husband told her that she is grumpy.  She thinks it is correct as she feels overstimulated, and awful.  She also reports stress and maternal grandparents.  She states that her mother and brother are trying to manipulate them over their asset.  Her focus could be better.  She reports struggle due to worsening in attention.  She feels anxious.  She denies SI.  She drinks a glass of wine on weekend.  She takes Ashwagandha.  She takes primal queen, and reports benefit from this.  She agrees with the plan as outlined below.   Substance use  Tobacco Alcohol Other substances/supplements  Current  Glass of wine on weekend Ashwagandha, Primal queen  Past  History of binge drinking denies  Past Treatment         156 lbs in Dec Wt Readings from Last 3 Encounters:  07/08/23 163 lb (73.9 kg)  03/06/23 160 lb 3.2 oz (72.7 kg)  11/09/22 161 lb 12.8 oz (73.4 kg)     Visit Diagnosis:    ICD-10-CM   1. PTSD (post-traumatic stress disorder)  F43.10     2. MDD (major depressive disorder), recurrent episode, mild (HCC)  F33.0     3. GAD (generalized anxiety disorder)  F41.1     4. Attention deficit hyperactivity disorder (  ADHD), unspecified ADHD type [F90.9]  F90.9     5. Insomnia, unspecified type  G47.00       Past Psychiatric History: Please see initial evaluation for full details. I have reviewed the history. No updates at this time.     Past Medical History:  Past Medical History:  Diagnosis Date   ADHD    Anemia    Asthma    exercise induced   BULIMIA 01/17/2008   Annotation: AGE 43 Qualifier: Diagnosis of  By: Lorrene Reid LPN, Wanda     Complication of anesthesia    vomited during last c/s   CPD (cephalo-pelvic disproportion) 12/22/2014   Cystic  fibrosis carrier in second trimester, antepartum 05/29/2014   Depression    Eczema    GERD (gastroesophageal reflux disease)    chronic   Headache    h/o migraines   Hypothyroidism    Nausea    Oligomenorrhea 12/22/2014   PCOS (polycystic ovarian syndrome)    PONV (postoperative nausea and vomiting)    Rhinitis, allergic    Trigeminal neuralgia of right side of face     Past Surgical History:  Procedure Laterality Date   CESAREAN SECTION  41/22/2014   LTCS; CPD   CESAREAN SECTION N/A 10/27/2015   Procedure: REPEAT CESAREAN SECTION;  Surgeon: Herold Harms, MD;  Location: [facility] ORS;  Service: Obstetrics;  Laterality: N/A;   ENDOSCOPIC TURBINATE REDUCTION Bilateral 07/26/2017   Procedure: ENDOSCOPIC INFERIOR  TURBINATE REDUCTION;  Surgeon: Vernie Murders, MD;  Location: [facility];  Service: ENT;  Laterality: Bilateral;   FRONTAL SINUS EXPLORATION Bilateral 07/26/2017   Procedure: FRONTAL SINUS EXPLORATION;  Surgeon: Vernie Murders, MD;  Location: [facility];  Service: ENT;  Laterality: Bilateral;   IMAGE GUIDED SINUS SURGERY Bilateral 07/26/2017   Procedure: IMAGE GUIDED SINUS SURGERY;  Surgeon: Vernie Murders, MD;  Location: [facility];  Service: ENT;  Laterality: Bilateral;  GAVE DISK TO CECE 2-12   SEPTOPLASTY WITH ETHMOIDECTOMY, AND MAXILLARY ANTROSTOMY Bilateral 07/26/2017   Procedure: SEPTOPLASTY WITH TOTAL  ETHMOIDECTOMY, AND MAXILLARY ANTROSTOMYWITH REMOVAL OF TISSUE,;  Surgeon: Vernie Murders, MD;  Location: [facility];  Service: ENT;  Laterality: Bilateral;   TONSILLECTOMY     WISDOM TOOTH EXTRACTION      Family Psychiatric History: Please see initial evaluation for full details. I have reviewed the history. No updates at this time.     Family History:  Family History  Problem Relation Age of Onset   Endometriosis Mother    Bipolar disorder Mother    Alcohol abuse Mother    Heart disease Maternal Grandmother        valvular problem    Depression Maternal Grandmother    Breast cancer Neg Hx    Ovarian cancer Neg Hx    Colon cancer Neg Hx     Social History:  Social History   Socioeconomic History   Marital status: Married    Spouse name: Not on file   Number of children: Not on file   Years of education: Not on file   Highest education level: Not on file  Occupational History   Occupation: ED tech    Employer: [employer]  Tobacco Use   Smoking status: Never   Smokeless tobacco: Never  Vaping Use   Vaping status: Never Used  Substance and Sexual Activity   Alcohol use: Yes    Alcohol/week: 2.0 standard drinks of alcohol    Types: 2 Standard drinks or equivalent per week  Comment: Social   Drug use: No   Sexual activity: Yes    Partners: Male    Birth control/protection: Pill  Other Topics Concern   Not on file  Social History Narrative   Not on file   Social Drivers of Health   Financial Resource Strain: Low Risk  (05/08/2023)   Received from [facility]   Overall Financial Resource Strain (CARDIA)    Difficulty of Paying Living Expenses: Not hard at all  Food Insecurity: No Food Insecurity (05/08/2023)   Received from [facility]   Hunger Vital Sign    Worried About Running Out of Food in the Last Year: Never true    Ran Out of Food in the Last Year: Never true  Transportation Needs: No Transportation Needs (05/08/2023)   Received from [facility] - Transportation    In the past 12 months, has lack of transportation kept you from medical appointments or from getting medications?: No    Lack of Transportation (Non-Medical): No  Physical Activity: Not on file  Stress: Not on file  Social Connections: Not on file    Allergies:  Allergies  Allergen Reactions   Iodine Anaphylaxis    Contrast    Shellfish Allergy Hives   Contrast Media [Iodinated Contrast Media]    Flagyl [Metronidazole]     Metabolic Disorder Labs: Lab  Results  Component Value Date   HGBA1C 5.1 01/25/2023   Lab Results  Component Value Date   PROLACTIN 35.8 (H) 03/14/2016   PROLACTIN 43.4 12/27/2007   Lab Results  Component Value Date   CHOL 295 (H) 01/25/2023   TRIG 74 01/25/2023   HDL 55 01/25/2023   CHOLHDL 5.4 (H) 01/25/2023   VLDL 17 09/25/2011   LDLCALC 228 (H) 01/25/2023   LDLCALC 214 (H) 08/10/2021   Lab Results  Component Value Date   TSH 1.850 01/25/2023   TSH 1.040 09/05/2022    Therapeutic Level Labs: No results found for: "LITHIUM" No results found for: "VALPROATE" No results found for: "CBMZ"  Current Medications: Current Outpatient Medications  Medication Sig Dispense Refill   clonazePAM (KLONOPIN) 0.5 MG tablet Take 0.5 mg by mouth at bedtime as needed for anxiety.     [START ON 09/23/2023] venlafaxine XR (EFFEXOR-XR) 75 MG 24 hr capsule Take 1 capsule (75 mg total) by mouth daily with breakfast. 90 capsule 1   albuterol (PROVENTIL HFA;VENTOLIN HFA) 108 (90 Base) MCG/ACT inhaler Inhale 2 puffs into the lungs every 6 (six) hours as needed for wheezing or shortness of breath.     ARAZLO 0.045 % LOTN Apply topically. (Patient not taking: Reported on 10/19/2022)     atorvastatin (LIPITOR) 40 MG tablet Take 1 tablet (40 mg total) by mouth daily. 30 tablet 5   azelastine (OPTIVAR) 0.05 % ophthalmic solution 1 drop into affected eye Ophthalmic Twice a day 30 days 6 mL 3   carbamazepine (TEGRETOL-XR) 100 MG 12 hr tablet Take 1 tablet (100 mg total) by mouth 2 (two) times daily. (Patient not taking: Reported on 08/13/2023) 60 tablet 2   Drospirenone (SLYND) 4 MG TABS Take 1 tablet (4 mg total) by mouth daily. 84 tablet 3   Eflornithine HCl 13.9 % cream Apply a thin layer up to twice a day, at least 8 hours apart 45 g 6   EPINEPHrine 0.3 mg/0.3 mL IJ SOAJ injection as directed Injection prn 30 days 2 each 1   EPSOLAY 5 % cream  Apply 1 Application topically every morning.     fluticasone (FLONASE) 50 MCG/ACT nasal  spray 1 spray each nostril Nasally Once a day 30 days 16 g 3   gabapentin (NEURONTIN) 100 MG capsule Take 1 capsule (100 mg total) by mouth 2 (two) times daily for 7 days, THEN 2 capsules (200 mg total) 2 (two) times daily and continue this dose. 120 capsule 3   levocetirizine (XYZAL) 5 MG tablet Take 1 tablet (5 mg total) by mouth daily. 30 tablet 3   levothyroxine (SYNTHROID) 75 MCG tablet Take 1 tablet (75 mcg total) by mouth daily before breakfast. 30 tablet 11   LORazepam (ATIVAN) 1 MG tablet Take 1 tablet (1 mg total) by mouth daily as needed for anxiety. (Patient not taking: Reported on 08/13/2023) 20 tablet 0   [START ON 09/06/2023] methylphenidate (RITALIN) 10 MG tablet Take 1 tablet (10 mg total) by mouth daily before breakfast. 30 tablet 0   mometasone (ELOCON) 0.1 % cream 1 application Externally Twice a day 30 days (Patient not taking: Reported on 10/19/2022) 90 g 1   montelukast (SINGULAIR) 10 MG tablet Take 10 mg by mouth as needed.     spironolactone (ALDACTONE) 100 MG tablet TAKE 2 TABLETS BY MOUTH DAILY (Patient not taking: Reported on 10/19/2022) 180 tablet 3   traZODone (DESYREL) 100 MG tablet Take 2 tablets (200 mg total) by mouth at bedtime as needed for sleep. 180 tablet 0   venlafaxine XR (EFFEXOR-XR) 75 MG 24 hr capsule Take 3 capsules (225 mg total) by mouth daily with breakfast. 270 capsule 1   Current Facility-Administered Medications  Medication Dose Route Frequency Provider Last Rate Last Admin   cyanocobalamin ((VITAMIN B-12)) injection 1,000 mcg  1,000 mcg Intramuscular Once Doreene Burke, CNM         Musculoskeletal: Strength & Muscle Tone:  N/A Gait & Station:  N/A Patient leans: N/A  Psychiatric Specialty Exam: Review of Systems  Psychiatric/Behavioral:  Positive for decreased concentration, dysphoric mood and sleep disturbance. Negative for agitation, behavioral problems, confusion, hallucinations, self-injury and suicidal ideas. The patient is  nervous/anxious. The patient is not hyperactive.   All other systems reviewed and are negative.   There were no vitals taken for this visit.There is no height or weight on file to calculate BMI.  General Appearance: Well Groomed  Eye Contact:  Good  Speech:  Clear and Coherent  Volume:  Normal  Mood:   grumpy  Affect:  Appropriate, Congruent, and calm  Thought Process:  Coherent  Orientation:  Full (Time, Place, and Person)  Thought Content: Logical   Suicidal Thoughts:  No  Homicidal Thoughts:  No  Memory:  Immediate;   Good  Judgement:  Good  Insight:  Good  Psychomotor Activity:  Normal  Concentration:  Concentration: Good and Attention Span: Good  Recall:  Good  Fund of Knowledge: Good  Language: Good  Akathisia:  No  Handed:  Right  AIMS (if indicated): not done  Assets:  Communication Skills Desire for Improvement  ADL's:  Intact  Cognition: WNL  Sleep:  Poor   Screenings: GAD-7    Flowsheet Row Office Visit from 03/06/2023 in [facility] Office Visit from 06/16/2020 in [facility] Office Visit from 04/28/2020 in [facility]  Total GAD-7 Score 0 7 8      PHQ2-9    Flowsheet Row Office Visit from 03/06/2023 in [facility] Office Visit from 11/09/2022 in [facility] Office Visit from 10/20/2021 in [facility] Video Visit from 07/27/2021 in [facility] Video Visit from 03/30/2021 in [facility]  PHQ-2 Total Score 0 0 0 1 0  PHQ-9 Total Score -- -- 1 -- --      Flowsheet Row ED from 07/08/2023 in [facility] at [facility] ED from 05/04/2023 in [facility] Emergency Department at [facility] Video Visit from 03/30/2021 in [facility]  C-SSRS  RISK CATEGORY No Risk No Risk No Risk        Assessment and Plan:  TAKYAH CIARAMITARO is a 43 y.o. year old female with a history of PTSD, depression, anxiety, GERD, hypothyroidism, PCOS, who presents for follow up appointment for below.   1. PTSD (post-traumatic stress disorder) 2. MDD (major depressive disorder), recurrent, mild HCC) 3. GAD (generalized anxiety disorder) Acute stressors include: conflict with her daughter, occasional marital conflict, recent overdose of her mother, concern about her grandfather with memory loss  Other stressors include: childhood trauma, neck pain   There has been significant worsening in anxiety, irritability in the setting of having facial pain/spasms, and being off Focalin to mitigate risk of seizure.  Noted that she was prescribed gabapentin by neurologist.  She was informed to concern trying this medication as it can be helpful for her anxiety, while monitoring any side effect of drowsiness.  Will continue current dose of venlafaxine to target PTSD, depression and anxiety.   4. Attention deficit hyperactivity disorder (ADHD), unspecified ADHD type [F90.9] - neuropsych testing on 10/26/2022. Dx- ADHD, combined presentation, severe. UDS negative  10/2022    Worsening since being off Focalin to mitigate risk of possible seizure.  It is discussed with the patient to hold off this medication and hold  any further adjustment in her medication until she completes EEG to rule out seizure.  Discussed risk of seizure from Ritalin.   # Insomnia Worsening.  She has been taking higher dose of trazodone along with clonazepam.  Discussed potential risk of insomnia.  She expressed understanding not to do any further uptitration.    # Hypertension Although blood pressure was within acceptable range on recent visit with her neurologist, she continues to have hypertension in the setting of facial spasms.  Discussed possible risk of hypertension from venlafaxine, Focalin  and Ritalin, although her blood pressure has been in good control until recently.  She agrees to monitor home BP.  She expressed understanding that medication regimen will be changed if she has hypertension due to possible adverse reaction on blood pressure.    # Alcohol use Improving.  She denies any recent episode of binge drinking. She denies any craving for alcohol. Will continue to assess.    Plan - she would like all meds to be sent to [facility] Continue venlafaxine 225 mg daily Continue Trazodone 12.5 mg at night as needed for insomnia- drowsiness from higher dose Continue Focalin 15 mg daily Continue Ritalin 10 mg daily  Next appointment: 5/5 at 8 am for 30 mins, IP   Past trials of medication: sertraline, fluoxetine (nightmares), L-theanine , Ambien, Trazodone   The patient demonstrates the following risk factors for suicide: Chronic risk factors for suicide include: psychiatric disorder of depression, anxiety. Acute risk factors for suicide include: N/A. Protective factors for this patient include: positive social support, coping skills and hope for the future. Considering these factors,  the overall suicide risk at this point appears to be low. Patient is appropriate for outpatient follow up.   A total of 45 minutes was spent on the following activities during the encounter date, which includes but is not limited to: preparing to see the patient (e.g., reviewing tests and records), obtaining and/or reviewing separately obtained history, performing a medically necessary examination or evaluation, counseling and educating the patient, family, or caregiver, ordering medications, tests, or procedures, referring and communicating with other healthcare professionals (when not reported separately), documenting clinical information in the electronic or paper health record, independently interpreting test or lab results and communicating these results to the family or caregiver, and coordinating care  (when not reported separately).   Collaboration of Care: Collaboration of Care: Other reviewed notes in Epic  Patient/Guardian was advised Release of Information must be obtained prior to any record release in order to collaborate their care with an outside provider. Patient/Guardian was advised if they have not already done so to contact the registration department to sign all necessary forms in order for [place] to release information regarding their care.   Consent: Patient/Guardian gives verbal consent for treatment and assignment of benefits for services provided during this visit. Patient/Guardian expressed understanding and agreed to proceed.    Paula Hotter, MD 08/13/2023, 10:55 AM

## 2023-08-13 ENCOUNTER — Encounter: Payer: Self-pay | Admitting: Psychiatry

## 2023-08-13 ENCOUNTER — Telehealth (INDEPENDENT_AMBULATORY_CARE_PROVIDER_SITE_OTHER): Payer: 59 | Admitting: Psychiatry

## 2023-08-13 DIAGNOSIS — F4310 Post-traumatic stress disorder, unspecified: Secondary | ICD-10-CM

## 2023-08-13 DIAGNOSIS — F411 Generalized anxiety disorder: Secondary | ICD-10-CM | POA: Diagnosis not present

## 2023-08-13 DIAGNOSIS — F909 Attention-deficit hyperactivity disorder, unspecified type: Secondary | ICD-10-CM

## 2023-08-13 DIAGNOSIS — F330 Major depressive disorder, recurrent, mild: Secondary | ICD-10-CM

## 2023-08-13 DIAGNOSIS — G4700 Insomnia, unspecified: Secondary | ICD-10-CM

## 2023-08-13 MED ORDER — METHYLPHENIDATE HCL 10 MG PO TABS: 10.0000 mg | ORAL_TABLET | Freq: Every day | ORAL | 0 refills | Status: DC

## 2023-08-13 MED ORDER — TRAZODONE HCL 100 MG PO TABS: 200.0000 mg | ORAL_TABLET | Freq: Every evening | ORAL | 0 refills | Status: DC | PRN

## 2023-08-13 MED ORDER — VENLAFAXINE HCL ER 75 MG PO CP24: 75.0000 mg | ORAL_CAPSULE | Freq: Every day | ORAL | 1 refills | Status: DC

## 2023-08-13 NOTE — Patient Instructions (Signed)
 Continue venlafaxine 225 mg daily Continue Trazodone 12.5 mg at night as needed for insomnia- Continue Focalin 15 mg daily Continue Ritalin 10 mg daily  Next appointment: 5/5 at 8 am

## 2023-09-14 ENCOUNTER — Ambulatory Visit
Admit: 2023-09-14 | Discharge: 2023-09-14 | Payer: MEDICAID | Attending: Student in an Organized Health Care Education/Training Program

## 2023-09-14 VITALS — BP 130/84 | HR 87 | Resp 17 | Ht 64.0 in | Wt 196.0 lb

## 2023-09-14 DIAGNOSIS — Z23 Encounter for immunization: Secondary | ICD-10-CM

## 2023-09-14 NOTE — Progress Notes (Signed)
 Per Dr Cortland Ding, Gardasil vaccine given in her right delt. LOT [id number] EXP 02/2025. Patient tolerated well.

## 2023-09-25 NOTE — Progress Notes (Deleted)
 BH MD/PA/NP OP Progress Note  09/25/2023 8:07 AM AMILLION STARNES  MRN:  [id number]  Chief Complaint: No chief complaint on file.  HPI: *** According to the chart review, the following events have occurred since the last visit: The patient underwent EEG. This routine EEG in the awake and asleep states is within normal limits.    Gabapentin , carbamazepine , clonazepam  Substance use   Tobacco Alcohol Other substances/supplements  Current   Glass of wine on weekend Ashwagandha, Primal queen  Past   History of binge drinking denies  Past Treatment             Visit Diagnosis: No diagnosis found.  Past Psychiatric History: Please see initial evaluation for full details. I have reviewed the history. No updates at this time.     Past Medical History:  Past Medical History:  Diagnosis Date   ADHD    Anemia    Asthma    exercise induced   BULIMIA 01/17/2008   Annotation: AGE 43 Qualifier: Diagnosis of  By: Kelleen Patee LPN, Wanda     Complication of anesthesia    vomited during last c/s   CPD (cephalo-pelvic disproportion) 12/22/2014   Cystic fibrosis carrier in second trimester, antepartum 05/29/2014   Depression    Eczema    GERD (gastroesophageal reflux disease)    chronic   Headache    h/o migraines   Hypothyroidism    Nausea    Oligomenorrhea 12/22/2014   PCOS (polycystic ovarian syndrome)    PONV (postoperative nausea and vomiting)    Rhinitis, allergic    Trigeminal neuralgia of right side of face     Past Surgical History:  Procedure Laterality Date   CESAREAN SECTION  04/19/2013   LTCS; CPD   CESAREAN SECTION N/A 10/27/2015   Procedure: REPEAT CESAREAN SECTION;  Surgeon: Colan Dash, MD;  Location: [facility];  Service: Obstetrics;  Laterality: N/A;   ENDOSCOPIC TURBINATE REDUCTION Bilateral 07/26/2017   Procedure: ENDOSCOPIC INFERIOR  TURBINATE REDUCTION;  Surgeon: Mellody Sprout, MD;  Location: [facility];  Service: ENT;  Laterality: Bilateral;    FRONTAL SINUS EXPLORATION Bilateral 07/26/2017   Procedure: FRONTAL SINUS EXPLORATION;  Surgeon: Mellody Sprout, MD;  Location: [facility];  Service: ENT;  Laterality: Bilateral;   IMAGE GUIDED SINUS SURGERY Bilateral 07/26/2017   Procedure: IMAGE GUIDED SINUS SURGERY;  Surgeon: Mellody Sprout, MD;  Location: [facility];  Service: ENT;  Laterality: Bilateral;  GAVE DISK TO CECE 2-12   SEPTOPLASTY WITH ETHMOIDECTOMY, AND MAXILLARY ANTROSTOMY Bilateral 07/26/2017   Procedure: SEPTOPLASTY WITH TOTAL  ETHMOIDECTOMY, AND MAXILLARY ANTROSTOMYWITH REMOVAL OF TISSUE,;  Surgeon: Juengel, Paul, MD;  Location: [facility];  Service: ENT;  Laterality: Bilateral;   TONSILLECTOMY     WISDOM TOOTH EXTRACTION      Family Psychiatric History: Please see initial evaluation for full details. I have reviewed the history. No updates at this time.     Family History:  Family History  Problem Relation Age of Onset   Endometriosis Mother    Bipolar disorder Mother    Alcohol abuse Mother    Heart disease Maternal Grandmother        valvular problem   Depression Maternal Grandmother    Breast cancer Neg Hx    Ovarian cancer Neg Hx    Colon cancer Neg Hx     Social History:  Social History   Socioeconomic History   Marital status: Married    Spouse name: Not on file  Number of children: Not on file   Years of education: Not on file   Highest education level: Not on file  Occupational History   Occupation: ED tech    Employer: [employer]  Tobacco Use   Smoking status: Never   Smokeless tobacco: Never  Vaping Use   Vaping status: Never Used  Substance and Sexual Activity   Alcohol use: Yes    Alcohol/week: 2.0 standard drinks of alcohol    Types: 2 Standard drinks or equivalent per week    Comment: Social   Drug use: No   Sexual activity: Yes    Partners: Male    Birth control/protection: Pill  Other Topics Concern   Not on file  Social History Narrative   Not on file    Social Drivers of Health   Financial Resource Strain: Low Risk  (05/08/2023)   Received from [facility]   Overall Financial Resource Strain (CARDIA)    Difficulty of Paying Living Expenses: Not hard at all  Food Insecurity: No Food Insecurity (05/08/2023)   Received from [facility]   Hunger Vital Sign    Worried About Running Out of Food in the Last Year: Never true    Ran Out of Food in the Last Year: Never true  Transportation Needs: No Transportation Needs (05/08/2023)   Received from [facility] - Transportation    In the past 12 months, has lack of transportation kept you from medical appointments or from getting medications?: No    Lack of Transportation (Non-Medical): No  Physical Activity: Not on file  Stress: Not on file  Social Connections: Not on file    Allergies:  Allergies  Allergen Reactions   Iodine Anaphylaxis    Contrast    Shellfish Allergy  Hives   Contrast Media [Iodinated Contrast Media]    Flagyl  [Metronidazole ]     Metabolic Disorder Labs: Lab Results  Component Value Date   HGBA1C 5.1 01/25/2023   Lab Results  Component Value Date   PROLACTIN 35.8 (H) 03/14/2016   PROLACTIN 43.4 12/27/2007   Lab Results  Component Value Date   CHOL 295 (H) 01/25/2023   TRIG 74 01/25/2023   HDL 55 01/25/2023   CHOLHDL 5.4 (H) 01/25/2023   VLDL 17 09/25/2011   LDLCALC 228 (H) 01/25/2023   LDLCALC 214 (H) 08/10/2021   Lab Results  Component Value Date   TSH 1.850 01/25/2023   TSH 1.040 09/05/2022    Therapeutic Level Labs: No results found for: "LITHIUM" No results found for: "VALPROATE" No results found for: "CBMZ"  Current Medications: Current Outpatient Medications  Medication Sig Dispense Refill   albuterol (PROVENTIL HFA;VENTOLIN HFA) 108 (90 Base) MCG/ACT inhaler Inhale 2 puffs into the lungs every 6 (six) hours as needed for wheezing or shortness of breath.     ARAZLO  0.045 % LOTN Apply topically. (Patient not taking: Reported on 10/19/2022)     atorvastatin  (LIPITOR) 40 MG tablet Take 1 tablet (40 mg total) by mouth daily. 30 tablet 5   azelastine  (OPTIVAR ) 0.05 % ophthalmic solution 1 drop into affected eye Ophthalmic Twice a day 30 days 6 mL 3   carbamazepine  (TEGRETOL -XR) 100 MG 12 hr tablet Take 1 tablet (100 mg total) by mouth 2 (two) times daily. (Patient not taking: Reported on 08/13/2023) 60 tablet 2   clonazePAM (KLONOPIN) 0.5 MG tablet Take 0.5 mg by mouth at bedtime as needed for anxiety.     Drospirenone  (  SLYND ) 4 MG TABS Take 1 tablet (4 mg total) by mouth daily. 84 tablet 3   Eflornithine HCl 13.9 % cream Apply a thin layer up to twice a day, at least 8 hours apart 45 g 6   EPINEPHrine  0.3 mg/0.3 mL IJ SOAJ injection as directed Injection prn 30 days 2 each 1   EPSOLAY 5 % cream Apply 1 Application topically every morning.     fluticasone  (FLONASE ) 50 MCG/ACT nasal spray 1 spray each nostril Nasally Once a day 30 days 16 g 3   gabapentin  (NEURONTIN ) 100 MG capsule Take 1 capsule (100 mg total) by mouth 2 (two) times daily for 7 days, THEN 2 capsules (200 mg total) 2 (two) times daily and continue this dose. 120 capsule 3   levocetirizine (XYZAL ) 5 MG tablet Take 1 tablet (5 mg total) by mouth daily. 30 tablet 3   levothyroxine  (SYNTHROID ) 75 MCG tablet Take 1 tablet (75 mcg total) by mouth daily before breakfast. 30 tablet 11   LORazepam  (ATIVAN ) 1 MG tablet Take 1 tablet (1 mg total) by mouth daily as needed for anxiety. (Patient not taking: Reported on 08/13/2023) 20 tablet 0   methylphenidate  (RITALIN ) 10 MG tablet Take 1 tablet (10 mg total) by mouth daily before breakfast. 30 tablet 0   mometasone  (ELOCON ) 0.1 % cream 1 application Externally Twice a day 30 days (Patient not taking: Reported on 10/19/2022) 90 g 1   montelukast (SINGULAIR) 10 MG tablet Take 10 mg by mouth as needed.     spironolactone  (ALDACTONE ) 100 MG tablet TAKE 2 TABLETS BY  MOUTH DAILY (Patient not taking: Reported on 10/19/2022) 180 tablet 3   traZODone  (DESYREL ) 100 MG tablet Take 2 tablets (200 mg total) by mouth at bedtime as needed for sleep. 180 tablet 0   venlafaxine  XR (EFFEXOR -XR) 75 MG 24 hr capsule Take 3 capsules (225 mg total) by mouth daily with breakfast. 270 capsule 1   venlafaxine  XR (EFFEXOR -XR) 75 MG 24 hr capsule Take 1 capsule (75 mg total) by mouth daily with breakfast. 90 capsule 1   Current Facility-Administered Medications  Medication Dose Route Frequency Provider Last Rate Last Admin   cyanocobalamin  ((VITAMIN B-12)) injection 1,000 mcg  1,000 mcg Intramuscular Once Alise Appl, CNM         Musculoskeletal: Strength & Muscle Tone: within normal limits Gait & Station: normal Patient leans: N/A  Psychiatric Specialty Exam: Review of Systems  There were no vitals taken for this visit.There is no height or weight on file to calculate BMI.  General Appearance: {Appearance:22683}  Eye Contact:  {BHH EYE CONTACT:22684}  Speech:  Clear and Coherent  Volume:  Normal  Mood:  {BHH MOOD:22306}  Affect:  {Affect (PAA):22687}  Thought Process:  Coherent  Orientation:  Full (Time, Place, and Person)  Thought Content: Logical   Suicidal Thoughts:  {ST/HT (PAA):22692}  Homicidal Thoughts:  {ST/HT (PAA):22692}  Memory:  Immediate;   Good  Judgement:  {Judgement (PAA):22694}  Insight:  {Insight (PAA):22695}  Psychomotor Activity:  Normal  Concentration:  Concentration: Good and Attention Span: Good  Recall:  Good  Fund of Knowledge: Good  Language: Good  Akathisia:  No  Handed:  Right  AIMS (if indicated): not done  Assets:  Communication Skills Desire for Improvement  ADL's:  Intact  Cognition: WNL  Sleep:  {BHH GOOD/FAIR/POOR:22877}   Screenings: GAD-7    Flowsheet Row Office Visit from 03/06/2023 in [facility] Office Visit from 06/16/2020 in  [facility] Office Visit from  04/28/2020 in [facility]  Total GAD-7 Score 0 7 8      PHQ2-9    Flowsheet Row Office Visit from 03/06/2023 in [facility] Office Visit from 11/09/2022 in [facility] Office Visit from 10/20/2021 in [facility] Video Visit from 07/27/2021 in [facility] Video Visit from 03/30/2021 in [facility]  PHQ-2 Total Score 0 0 0 1 0  PHQ-9 Total Score -- -- 1 -- --      Flowsheet Row ED from 07/08/2023 in [facility] Department at [facility] ED from 05/04/2023 in [facility] Emergency Department at [facility] Video Visit from 03/30/2021 in [facility]  C-SSRS RISK CATEGORY No Risk No Risk No Risk        Assessment and Plan:  ANE METHOD is a 43 y.o. year old female with a history of PTSD, depression, anxiety, GERD, hypothyroidism, PCOS, who presents for follow up appointment for below.    1. PTSD (post-traumatic stress disorder) 2. MDD (major depressive disorder), recurrent, mild HCC) 3. GAD (generalized anxiety disorder) Acute stressors include: conflict with her daughter, occasional marital conflict, recent overdose of her mother, concern about her grandfather with memory loss  Other stressors include: childhood trauma, neck pain   There has been significant worsening in anxiety, irritability in the setting of having facial pain/spasms, and being off Focalin  to mitigate risk of seizure.  Noted that she was prescribed gabapentin  by neurologist.  She was informed to concern trying this medication as it can be helpful for her anxiety, while monitoring any side effect of drowsiness.  Will continue current dose of venlafaxine  to target PTSD, depression and anxiety.    4. Attention deficit hyperactivity disorder (ADHD),  unspecified ADHD type [F90.9] - neuropsych testing on 10/26/2022. Dx- ADHD, combined presentation, severe. UDS negative  10/2022    Worsening since being off Focalin  to mitigate risk of possible seizure.  It is discussed with the patient to hold off this medication and hold  any further adjustment in her medication until she completes EEG to rule out seizure.  Discussed risk of seizure from Ritalin .    # Insomnia Worsening.  She has been taking higher dose of trazodone  along with clonazepam.  Discussed potential risk of insomnia.  She expressed understanding not to do any further uptitration.    # Hypertension Although blood pressure was within acceptable range on recent visit with her neurologist, she continues to have hypertension in the setting of facial spasms.  Discussed possible risk of hypertension from venlafaxine , Focalin  and Ritalin , although her blood pressure has been in good control until recently.  She agrees to monitor home BP.  She expressed understanding that medication regimen will be changed if she has hypertension due to possible adverse reaction on blood pressure.    # Alcohol use Improving.  She denies any recent episode of binge drinking. She denies any craving for alcohol. Will continue to assess.    Plan - she would like all meds to be sent to [facility] Continue venlafaxine  225 mg daily Continue Trazodone  12.5 mg at night as needed for insomnia- drowsiness from higher dose Continue Focalin  15 mg daily Continue Ritalin  10 mg daily  Next appointment: 5/5 at 8 am for 30 mins, IP   Past trials of medication: sertraline , fluoxetine  (nightmares), L-theanine , Ambien ,  Trazodone    The patient demonstrates the following risk factors for suicide: Chronic risk factors for suicide include: psychiatric disorder of depression, anxiety. Acute risk factors for suicide include: N/A. Protective factors for this patient include: positive social support, coping skills and hope for the future.  Considering these factors, the overall suicide risk at this point appears to be low. Patient is appropriate for outpatient follow up.  Collaboration of Care: Collaboration of Care: {BH OP Collaboration of Care:21014065}  Patient/Guardian was advised Release of Information must be obtained prior to any record release in order to collaborate their care with an outside provider. Patient/Guardian was advised if they have not already done so to contact the registration department to sign all necessary forms in order for us  to release information regarding their care.   Consent: Patient/Guardian gives verbal consent for treatment and assignment of benefits for services provided during this visit. Patient/Guardian expressed understanding and agreed to proceed.    Todd Fossa, MD 09/25/2023, 8:07 AM

## 2023-09-27 ENCOUNTER — Other Ambulatory Visit: Payer: Self-pay | Admitting: Psychiatry

## 2023-09-27 MED ORDER — VENLAFAXINE HCL ER 225 MG PO TB24: 225.0000 mg | ORAL_TABLET | Freq: Every day | ORAL | 0 refills | Status: DC

## 2023-09-27 NOTE — Telephone Encounter (Signed)
 Could you contact [facility] and cancel venlafaxine  75 mg order? thanks

## 2023-09-27 NOTE — Telephone Encounter (Signed)
 Could you please contact the patient and the pharmacy to clarify what might be going on? The prescription is for 150 mg and 75 mg capsules to be taken together for a total daily dose of 225 mg.

## 2023-09-28 ENCOUNTER — Telehealth: Payer: Self-pay

## 2023-09-28 NOTE — Telephone Encounter (Signed)
 prior [employer] has been submitted and is pending

## 2023-09-28 NOTE — Telephone Encounter (Signed)
 received fax that a prior auth was needed for the venlafaxine .

## 2023-10-01 ENCOUNTER — Ambulatory Visit: Payer: Self-pay | Admitting: Psychiatry

## 2023-10-01 ENCOUNTER — Telehealth: Payer: Self-pay

## 2023-10-01 ENCOUNTER — Other Ambulatory Visit: Payer: Self-pay | Admitting: Psychiatry

## 2023-10-01 MED ORDER — VENLAFAXINE HCL ER 150 MG PO CP24: 150.0000 mg | ORAL_CAPSULE | Freq: Every day | ORAL | 0 refills | Status: DC

## 2023-10-01 MED ORDER — VENLAFAXINE HCL ER 75 MG PO CP24: 75.0000 mg | ORAL_CAPSULE | Freq: Every day | ORAL | 0 refills | Status: DC

## 2023-10-01 NOTE — Telephone Encounter (Signed)
 prior auth was denided.

## 2023-10-01 NOTE — Telephone Encounter (Signed)
 please change rx for venlafaxine  er 225mg  to 150mg  and 75mg  for insurance to cover. pt was last seen on 3-17 no follow up set up

## 2023-10-01 NOTE — Telephone Encounter (Signed)
 Ordered. Please contact to make a follow up.

## 2023-10-02 NOTE — Telephone Encounter (Signed)
  called pharmacy to make sure that they received the 150mg  and the 75mg . she stated that they had to order the 150mg  but that it would be ready by tomorrow after 1:00

## 2023-10-02 NOTE — Telephone Encounter (Signed)
 left message that insurance would not pay for 225mg  but that a 150mg  and a 75mg  was sent to the pharmacy and that per the pharm, rx would be ready after 1:00 tomorrow,

## 2023-10-02 NOTE — Telephone Encounter (Signed)
 called pharmacy to make sure that they received the 150mg  and the 75mg . she stated that they had to order the 150mg  but that it would be ready by tomorrow after 1:00

## 2023-10-04 NOTE — Progress Notes (Signed)
 Virtual Visit via Video Note  I connected with Paula [name] on 10/09/23 at  9:00 AM EDT by a video enabled telemedicine application and verified that I am speaking with the correct person using two identifiers.  Location: Patient: home Provider: office Persons participated in the visit- patient, provider    I discussed the limitations of evaluation and management by telemedicine and the availability of in person appointments. The patient expressed understanding and agreed to proceed. I discussed the assessment and treatment plan with the patient. The patient was provided an opportunity to ask questions and all were answered. The patient agreed with the plan and demonstrated an understanding of the instructions.   The patient was advised to call back or seek an in-person evaluation if the symptoms worsen or if the condition fails to improve as anticipated.   Todd Fossa, MD    [facility] MD/PA/NP OP Progress Note  10/09/2023 9:43 AM Paula [name]  MRN:  [id number]  Chief Complaint:  Chief Complaint  Patient presents with   Follow-up   HPI:  -She was seen by Dr. Primus Brookes. "her myriad of symptoms could be related to complex migraines. Seizure activity has been ruled out. " - she was seen by Dr. Alverda Ave. Metformin was started for PCOS.  This is a follow-up appointment for PTSD, question and anxiety.  She states that there has been some progress.  She was seen by her primary care provider, and was informed that she might have complex migraine.  They did not pursue the EEG as the seizure was less likely.  She decided not to work as this could be stress-induced.  She states that her body is not traumatic state for a long time since childhood.  She does not feel rested.  She constantly feels alert.  She tends to be anxious in the crowd.  She is hypersensitive to things.  She wonders if this might be related to the family issues.  Her grandfather has dementia. She thinks her mother,  and her brother takes advantage on this.  This reminds her of her childhood.  She has insomnia.  She has nightmares and flashback.  She denies SI.  She reports significant worsening in concentration since being off Focalin , although it did not make any difference in her physical condition.  She agrees with the plan as outlined below..   BP (!) 142/90  Pulse 78  Ht 147.3 cm (4\' 10" )  Wt 77.8 kg (171 lb 9.6 oz   Wt Readings from Last 3 Encounters:  07/08/23 163 lb (73.9 kg)  03/06/23 160 lb 3.2 oz (72.7 kg)  11/09/22 161 lb 12.8 oz (73.4 kg)     Substance use   Tobacco Alcohol Other substances/supplements  Current   Glass of wine on weekend Ashwagandha, Primal queen  Past   History of binge drinking denies  Past Treatment             Visit Diagnosis:    ICD-10-CM   1. PTSD (post-traumatic stress disorder)  F43.10     2. MDD (major depressive disorder), recurrent episode, mild (HCC)  F33.0     3. GAD (generalized anxiety disorder)  F41.1     4. Attention deficit hyperactivity disorder (ADHD), unspecified ADHD type [F90.9]  F90.9     5. Insomnia, unspecified type  G47.00       Past Psychiatric History: Please see initial evaluation for full details. I have reviewed the history. No updates at this time.  Past Medical History:  Past Medical History:  Diagnosis Date   ADHD    Anemia    Asthma    exercise induced   BULIMIA 01/17/2008   Annotation: AGE 64 Qualifier: Diagnosis of  By: Kelleen Patee LPN, Wanda     Complication of anesthesia    vomited during last c/s   CPD (cephalo-pelvic disproportion) 12/22/2014   Cystic fibrosis carrier in second trimester, antepartum 05/29/2014   Depression    Eczema    GERD (gastroesophageal reflux disease)    chronic   Headache    h/o migraines   Hypothyroidism    Nausea    Oligomenorrhea 12/22/2014   PCOS (polycystic ovarian syndrome)    PONV (postoperative nausea and vomiting)    Rhinitis, allergic    Trigeminal neuralgia of  right side of face     Past Surgical History:  Procedure Laterality Date   CESAREAN SECTION  04/19/2013   LTCS; CPD   CESAREAN SECTION N/A 10/27/2015   Procedure: REPEAT CESAREAN SECTION;  Surgeon: Colan Dash, MD;  Location: [facility] ORS;  Service: Obstetrics;  Laterality: N/A;   ENDOSCOPIC TURBINATE REDUCTION Bilateral 07/26/2017   Procedure: ENDOSCOPIC INFERIOR  TURBINATE REDUCTION;  Surgeon: Mellody Sprout, MD;  Location: [facility];  Service: ENT;  Laterality: Bilateral;   FRONTAL SINUS EXPLORATION Bilateral 07/26/2017   Procedure: FRONTAL SINUS EXPLORATION;  Surgeon: Mellody Sprout, MD;  Location: [facility];  Service: ENT;  Laterality: Bilateral;   IMAGE GUIDED SINUS SURGERY Bilateral 07/26/2017   Procedure: IMAGE GUIDED SINUS SURGERY;  Surgeon: Mellody Sprout, MD;  Location: [facility];  Service: ENT;  Laterality: Bilateral;  GAVE DISK TO CECE 2-12   SEPTOPLASTY WITH ETHMOIDECTOMY, AND MAXILLARY ANTROSTOMY Bilateral 07/26/2017   Procedure: SEPTOPLASTY WITH TOTAL  ETHMOIDECTOMY, AND MAXILLARY ANTROSTOMYWITH REMOVAL OF TISSUE,;  Surgeon: Juengel, Paul, MD;  Location: [facility];  Service: ENT;  Laterality: Bilateral;   TONSILLECTOMY     WISDOM TOOTH EXTRACTION      Family Psychiatric History: Please see initial evaluation for full details. I have reviewed the history. No updates at this time.     Family History:  Family History  Problem Relation Age of Onset   Endometriosis Mother    Bipolar disorder Mother    Alcohol abuse Mother    Heart disease Maternal Grandmother        valvular problem   Depression Maternal Grandmother    Breast cancer Neg Hx    Ovarian cancer Neg Hx    Colon cancer Neg Hx     Social History:  Social History   Socioeconomic History   Marital status: Married    Spouse name: Not on file   Number of children: Not on file   Years of education: Not on file   Highest education level: Not on file  Occupational  History   Occupation: ED tech    Employer: [employer]  Tobacco Use   Smoking status: Never   Smokeless tobacco: Never  Vaping Use   Vaping status: Never Used  Substance and Sexual Activity   Alcohol use: Yes    Alcohol/week: 2.0 standard drinks of alcohol    Types: 2 Standard drinks or equivalent per week    Comment: Social   Drug use: No   Sexual activity: Yes    Partners: Male    Birth control/protection: Pill  Other Topics Concern   Not on file  Social History Narrative   Not on file   Social Drivers of  Health   Financial Resource Strain: Low Risk  (09/28/2023)   [facility]   Overall Financial Resource Strain (CARDIA)    Difficulty of Paying Living Expenses: Not hard at all  Food Insecurity: No Food Insecurity (09/28/2023)   Received from [facility]   Hunger Vital Sign    Worried About Running Out of Food in the Last Year: Never true    Ran Out of Food in the Last Year: Never true  Transportation Needs: No Transportation Needs (09/28/2023)   [facility] from [facility] - Transportation    In the past 12 months, has lack of transportation kept you from medical appointments or from getting medications?: No    Lack of Transportation (Non-Medical): No  Physical Activity: Not on file  Stress: Not on file  Social Connections: Not on file    Allergies:  Allergies  Allergen Reactions   Iodine Anaphylaxis    Contrast    Shellfish Allergy  Hives   Contrast Media [Iodinated Contrast Media]    Flagyl  [Metronidazole ]     Metabolic Disorder Labs: Lab Results  Component Value Date   HGBA1C 5.1 01/25/2023   Lab Results  Component Value Date   PROLACTIN 35.8 (H) 03/14/2016   PROLACTIN 43.4 12/27/2007   Lab Results  Component Value Date   CHOL 295 (H) 01/25/2023   TRIG 74 01/25/2023   HDL 55 01/25/2023   CHOLHDL 5.4 (H) 01/25/2023   VLDL 17 09/25/2011   LDLCALC 228 (H) 01/25/2023   LDLCALC 214  (H) 08/10/2021   Lab Results  Component Value Date   TSH 1.850 01/25/2023   TSH 1.040 09/05/2022    Therapeutic Level Labs: No results found for: "LITHIUM" No results found for: "VALPROATE" No results found for: "CBMZ"  Current Medications: Current Outpatient Medications  Medication Sig Dispense Refill   metformin (FORTAMET) 500 MG (OSM) 24 hr tablet Take 500 mg by mouth daily with breakfast.     metFORMIN (GLUMETZA) 500 MG (MOD) 24 hr tablet Take 500 mg by mouth daily with breakfast.     [START ON 10/18/2023] methylphenidate  (RITALIN ) 10 MG tablet Take 1 tablet (10 mg total) by mouth daily. 30 tablet 0   prazosin (MINIPRESS) 1 MG capsule Take 1 capsule (1 mg total) by mouth at bedtime for 3 days. 3 capsule 0   [START ON 10/12/2023] prazosin (MINIPRESS) 2 MG capsule Take 1 capsule (2 mg total) by mouth at bedtime. Start after completing 1 mg at night for 3 days 30 capsule 1   albuterol (PROVENTIL HFA;VENTOLIN HFA) 108 (90 Base) MCG/ACT inhaler Inhale 2 puffs into the lungs every 6 (six) hours as needed for wheezing or shortness of breath.     ARAZLO 0.045 % LOTN Apply topically. (Patient not taking: Reported on 10/19/2022)     atorvastatin  (LIPITOR) 40 MG tablet Take 1 tablet (40 mg total) by mouth daily. 30 tablet 5   azelastine  (OPTIVAR ) 0.05 % ophthalmic solution 1 drop into affected eye Ophthalmic Twice a day 30 days 6 mL 3   carbamazepine  (TEGRETOL -XR) 100 MG 12 hr tablet Take 1 tablet (100 mg total) by mouth 2 (two) times daily. (Patient not taking: Reported on 08/13/2023) 60 tablet 2   clonazePAM (KLONOPIN) 0.5 MG tablet Take 0.5 mg by mouth at bedtime as needed for anxiety.     Drospirenone  (SLYND ) 4 MG TABS Take 1 tablet (4 mg total) by mouth daily. 84 tablet 3   Eflornithine  HCl 13.9 % cream Apply a thin layer up to twice a day, at least 8 hours apart 45 g 6   EPINEPHrine  0.3 mg/0.3 mL IJ SOAJ injection as directed Injection prn 30 days 2 each 1   EPSOLAY 5 % cream Apply 1  Application topically every morning.     fluticasone  (FLONASE ) 50 MCG/ACT nasal spray 1 spray each nostril Nasally Once a day 30 days 16 g 3   gabapentin  (NEURONTIN ) 100 MG capsule Take 1 capsule (100 mg total) by mouth 2 (two) times daily for 7 days, THEN 2 capsules (200 mg total) 2 (two) times daily and continue this dose. 120 capsule 3   levocetirizine (XYZAL ) 5 MG tablet Take 1 tablet (5 mg total) by mouth daily. 30 tablet 3   levothyroxine  (SYNTHROID ) 75 MCG tablet Take 1 tablet (75 mcg total) by mouth daily before breakfast. 30 tablet 11   LORazepam  (ATIVAN ) 1 MG tablet Take 1 tablet (1 mg total) by mouth daily as needed for anxiety. (Patient not taking: Reported on 08/13/2023) 20 tablet 0   [START ON 11/17/2023] methylphenidate  (RITALIN ) 10 MG tablet Take 1 tablet (10 mg total) by mouth daily before breakfast. 30 tablet 0   mometasone  (ELOCON ) 0.1 % cream 1 application Externally Twice a day 30 days (Patient not taking: Reported on 10/19/2022) 90 g 1   montelukast (SINGULAIR) 10 MG tablet Take 10 mg by mouth as needed.     spironolactone  (ALDACTONE ) 100 MG tablet TAKE 2 TABLETS BY MOUTH DAILY (Patient not taking: Reported on 10/19/2022) 180 tablet 3   traZODone  (DESYREL ) 100 MG tablet Take 2 tablets (200 mg total) by mouth at bedtime as needed for sleep. 180 tablet 0   venlafaxine  XR (EFFEXOR -XR) 150 MG 24 hr capsule Take 1 capsule (150 mg total) by mouth daily with breakfast. Total of 225 mg daily. Take along with 75 mg cap 90 capsule 0   venlafaxine  XR (EFFEXOR -XR) 75 MG 24 hr capsule Take 1 capsule (75 mg total) by mouth daily with breakfast. Total of 225 mg daily. Take along with 150 mg cap 90 capsule 0   Current Facility-Administered Medications  Medication Dose Route Frequency Provider Last Rate Last Admin   cyanocobalamin  ((VITAMIN B-12)) injection 1,000 mcg  1,000 mcg Intramuscular Once Thompson, Annie, CNM         Musculoskeletal: Strength & Muscle Tone: N/A Gait & Station:  N/A Patient leans: N/A  Psychiatric Specialty Exam: Review of Systems  Psychiatric/Behavioral:  Positive for decreased concentration, dysphoric mood and sleep disturbance. Negative for agitation, behavioral problems, confusion, hallucinations, self-injury and suicidal ideas. The patient is nervous/anxious. The patient is not hyperactive.   All other systems reviewed and are negative.   There were no vitals taken for this visit.There is no height or weight on file to calculate BMI.  General Appearance: Well Groomed  Eye Contact:  Good  Speech:  Clear and Coherent  Volume:  Normal  Mood:  Anxious  Affect:  Appropriate, Congruent, and slightly tense  Thought Process:  Coherent  Orientation:  Full (Time, Place, and Person)  Thought Content: Logical   Suicidal Thoughts:  No  Homicidal Thoughts:  No  Memory:  Immediate;   Good  Judgement:  Good  Insight:  Good  Psychomotor Activity:  Normal  Concentration:  Concentration: Good and Attention Span: Good  Recall:  Good  Fund of Knowledge: Good  Language: Good  Akathisia:  No  Handed:  Right  AIMS (if indicated): not done  Assets:  Communication Skills Desire for Improvement  ADL's:  Intact  Cognition: WNL  Sleep:  Poor   Screenings: GAD-7    Flowsheet Row Office Visit from 03/06/2023 in [facility] Office Visit from 06/16/2020 in [facility] Office Visit from 04/28/2020 in [facility]  Total GAD-7 Score 0 7 8      PHQ2-9    Flowsheet Row Office Visit from 03/06/2023 in [facility] Office Visit from 11/09/2022 in [facility] Office Visit from 10/20/2021 in [facility] Video Visit from 07/27/2021 in [facility] Video Visit from 03/30/2021 in [facility]  PHQ-2 Total Score 0 0 0  1 0  PHQ-9 Total Score -- -- 1 -- --      Flowsheet Row ED from 07/08/2023 in [facility] ED from 05/04/2023 in [facility] Department at [facility] Video Visit from 03/30/2021 in [facility]  C-SSRS RISK CATEGORY No Risk No Risk No Risk        Assessment and Plan:  Paula [name] is a 43 y.o. year old female with a history of PTSD, depression, anxiety, GERD, hypothyroidism, PCOS, who presents for follow up appointment for below.   1. PTSD (post-traumatic stress disorder) 2. MDD (major depressive disorder), recurrent episode, mild (HCC) 3. GAD (generalized anxiety disorder) Acute stressors include: conflict with her daughter, occasional marital conflict, recent overdose of her mother, concern about her grandfather with memory loss  Other stressors include: childhood trauma, neck pain  She reports PTSD symptoms, which includes nightmares and hyperarousal symptoms, which have been worsening in the context of ongoing family conflict involving her mother, brother, and grandfather.  She re-experiences trauma due to the current condition.  Will start prazosin to target nightmares, hyperarousal symptoms related to PTSD.  Will continue venlafaxine  to target PTSD, depression and anxiety.   4. Attention deficit hyperactivity disorder (ADHD), unspecified ADHD type [F90.9] - neuropsych testing on 10/26/2022. Dx- ADHD, combined presentation, severe. UDS negative  10/2022     Worsening since the unforgiving to mitigate risk of possible seizure, facial pain/spasms.  Although she may benefit from restarting this medication from psychiatry standpoint, she was advised to reach out to her provider to safely restart this medication.  Will continue current dose of Ritalin  to target ADHD.  Discussed possible risk of seizure.   # Insomnia Unchanged.  Prazosin will be started as outlined above.  Will continue  trazodone  as needed for insomnia.   # Hypertension Although blood pressure was within acceptable range on recent visit with her neurologist, she continues to have hypertension in the setting of facial spasms.  Discussed possible risk of hypertension from venlafaxine  and Ritalin , although her blood pressure has been in good control until recently.  She agrees to monitor home BP.  She expressed understanding that medication regimen will be changed if she has hypertension due to possible adverse reaction on blood pressure.    # Alcohol use Improving.  She denies any recent episode of binge drinking. She denies any craving for alcohol. Will continue to assess.    Plan - she would like all meds to be sent to [facility] Continue venlafaxine  225 mg daily Start prazosin 1 mg at night for 3 days, then 2 mg at night Continue Trazodone  12.5 mg at night as needed for insomnia- drowsiness  from higher dose Continue Ritalin  10 mg daily  Hold Focalin  (was on 15 mg) Next appointment: 5/5 at 8 am for 30 mins, IP   Past trials of medication: sertraline , fluoxetine  (nightmares), L-theanine , Ambien , Trazodone    The patient demonstrates the following risk factors for suicide: Chronic risk factors for suicide include: psychiatric disorder of depression, anxiety. Acute risk factors for suicide include: N/A. Protective factors for this patient include: positive social support, coping skills and hope for the future. Considering these factors, the overall suicide risk at this point appears to be low. Patient is appropriate for outpatient follow up.  Collaboration of Care: Collaboration of Care: Other reviewed notes in Epic  Patient/Guardian was advised Release of Information must be obtained prior to any record release in order to collaborate their care with an outside provider. Patient/Guardian was advised if they have not already done so to contact the registration department to sign all necessary forms in order for us   to release information regarding their care.   Consent: Patient/Guardian gives verbal consent for treatment and assignment of benefits for services provided during this visit. Patient/Guardian expressed understanding and agreed to proceed.    Todd Fossa, MD 10/09/2023, 9:43 AM

## 2023-10-09 ENCOUNTER — Encounter: Payer: Self-pay | Admitting: Psychiatry

## 2023-10-09 ENCOUNTER — Telehealth (INDEPENDENT_AMBULATORY_CARE_PROVIDER_SITE_OTHER): Admitting: Psychiatry

## 2023-10-09 DIAGNOSIS — F4310 Post-traumatic stress disorder, unspecified: Secondary | ICD-10-CM

## 2023-10-09 DIAGNOSIS — F411 Generalized anxiety disorder: Secondary | ICD-10-CM | POA: Diagnosis not present

## 2023-10-09 DIAGNOSIS — G4700 Insomnia, unspecified: Secondary | ICD-10-CM

## 2023-10-09 DIAGNOSIS — F909 Attention-deficit hyperactivity disorder, unspecified type: Secondary | ICD-10-CM | POA: Diagnosis not present

## 2023-10-09 DIAGNOSIS — F330 Major depressive disorder, recurrent, mild: Secondary | ICD-10-CM | POA: Diagnosis not present

## 2023-10-09 MED ORDER — METHYLPHENIDATE HCL 10 MG PO TABS: 10.0000 mg | ORAL_TABLET | Freq: Every day | ORAL | 0 refills | Status: DC

## 2023-10-09 MED ORDER — PRAZOSIN HCL 1 MG PO CAPS: 1.0000 mg | ORAL_CAPSULE | Freq: Every day | ORAL | 0 refills | Status: DC

## 2023-10-09 MED ORDER — PRAZOSIN HCL 2 MG PO CAPS: 2.0000 mg | ORAL_CAPSULE | Freq: Every day | ORAL | 1 refills | Status: DC

## 2023-10-09 NOTE — Patient Instructions (Signed)
 Continue venlafaxine  225 mg daily Start prazosin 1 mg at night for 3 days, then 2 mg at night Continue Trazodone  12.5 mg at night as needed for insomnia Continue Ritalin  10 mg daily  Next appointment: 5/5 at 8 am

## 2023-10-10 ENCOUNTER — Other Ambulatory Visit: Payer: Self-pay | Admitting: Psychiatry

## 2023-10-11 ENCOUNTER — Other Ambulatory Visit: Payer: Self-pay | Admitting: Psychiatry

## 2023-11-09 NOTE — Progress Notes (Signed)
 Chief Complaint:   Chief Complaint  Patient presents with  . Follow-up    Subjective:   Paula [name] is a 43 y.o. female in today for: Follow-up today  History of Present Illness Paula [name] is a 43 year old female with PCOS and neurological symptoms who presents with episodes of right facial and neck spasms.  She experiences episodes of facial spasms, primarily on the right side, which have evolved over time. Initially, these episodes involved flickering of the eye, but now they present with muscle tightening and closing of the right eye and occasionally spreads to the right upper arm.  She is conscious during these episodes for the most part.  They can last anywhere for few minutes to sometimes recurring within a 24-hour. Different neurological diagnoses have been entertained including trigeminal neuralgia, complex seizures versus complicated migraines.  She is supposed to have a 3-day EEG done through neurology which has not been set up yet.  She has been tried on various medications in the past.  None of them have decreased the intensity of these episodes.  She has PCOS and has been going through hormonal changes symptoms for which she has been following up with gynecology in the absence adjusted her hormone supplement medications.  Thyroid  labs were reviewed from last time and meds were adjusted where she is taking twice the medication dose on Sundays.  Has follow-up labs to be done today.  Blood pressure has been stable today..    She reports an ear infection in her right ear, which causes significant pain and has been persistent despite attempts to manage it with over-the-counter ear drops. The pain is exacerbated by swimming, an activity she enjoys for exercise and weight management.     Current Outpatient Medications  Medication Sig Dispense Refill  . albuterol 90 mcg/actuation inhaler Inhale 2 inhalations into the lungs every 6 (six) hours as needed for Wheezing or  Shortness of Breath 1 Inhaler 1  . atorvastatin  (LIPITOR) 40 MG tablet TAKE 1 TABLET BY MOUTH DAILY 90 tablet 1  . butalbital-acetaminophen -caffeine (FIORICET) 50-325-40 mg tablet Take 1 tablet at headache onset, can repeat after 4 hours. No more than 2 pills in 24 hours. Do not take more than 2-3 times a week MAXIMUM. 20 tablet 0  . drospirenone -e.estradioL -lm.FA 3-0.03-0.451 mg (21) (7) Tab Take 1 tablet by mouth once daily for 84 days 84 tablet 4  . fluticasone  propionate (FLONASE ) 50 mcg/actuation nasal spray Place 2 sprays into both nostrils once daily    . levocetirizine (XYZAL ) 5 MG tablet Take 5 mg by mouth once daily    . levothyroxine  (SYNTHROID ) 100 MCG tablet Take 1 tablet (100 mcg total) by mouth once daily 90 tablet 0  . losartan-hydroCHLOROthiazide (HYZAAR) 50-12.5 mg tablet Take 1 tablet by mouth once daily    . metFORMIN (GLUCOPHAGE-XR) 500 MG XR tablet Take 1 tablet (500 mg total) by mouth daily with dinner for 90 days 90 tablet 0  . methylphenidate  HCl (RITALIN ) 10 MG tablet Take 10 mg by mouth once daily    . ondansetron  (ZOFRAN ) 4 MG tablet take 1 tablet by mouth three times daily as needed for nausea    . prazosin  (MINIPRESS ) 2 MG capsule Take 2 mg by mouth    . traZODone  (DESYREL ) 50 MG tablet Take trazodone  12.5 - 25 mg nightly for sleep. (Patient taking differently: Take 100 mg by mouth at bedtime 50 mg) 30 tablet 11  . venlafaxine  (EFFEXOR -XR) 75 MG XR capsule Take 225  mg by mouth once daily    . cefdinir  (OMNICEF ) 300 mg capsule Take 1 capsule (300 mg total) by mouth 2 (two) times daily for 10 days 20 capsule 0   No current facility-administered medications for this visit.    Allergies as of 11/09/2023 - Reviewed 11/09/2023  Allergen Reaction Noted  . Metrizamide Anaphylaxis 07/22/2015  . Metronidazole  Rash 03/14/2016    Past Medical History:  Diagnosis Date  . ADHD   . Allergic state   . Anxiety   . Asthma without status asthmaticus (HHS-HCC)   . Cystic  fibrosis carrier   . Eczema, unspecified   . Hyperlipidemia    Result of keto diet  . PCOS (polycystic ovarian syndrome)   . Sinusitis, chronic, unspecified   . Thyroid  disease     Past Surgical History:  Procedure Laterality Date  . CESAREAN SECTION  2014  . CESAREAN SECTION  2017  . ADENOIDECTOMY    . TONSILLECTOMY       Family History  Problem Relation Name Age of Onset  . Bipolar disorder Mother    . Endometriosis Mother    . Heart failure Maternal Grandmother    . Stomach cancer Maternal Grandmother    . High blood pressure (Hypertension) Maternal Grandfather    . Reflux disease Maternal Grandfather      Social History:  reports that she has never smoked. She has never used smokeless tobacco. She reports current alcohol use. She reports that she does not use drugs.  Results for orders placed or performed in visit on 11/09/23  CBC w/auto Differential (5 Part)  Result Value Ref Range   WBC (White Blood Cell Count) 5.9 4.1 - 10.2 10^3/uL   RBC (Red Blood Cell Count) 4.33 4.04 - 5.48 10^6/uL   Hemoglobin 13.5 12.0 - 15.0 gm/dL   Hematocrit 61.3 64.9 - 47.0 %   MCV (Mean Corpuscular Volume) 89.1 80.0 - 100.0 fl   MCH (Mean Corpuscular Hemoglobin) 31.2 27.0 - 31.2 pg   MCHC (Mean Corpuscular Hemoglobin Concentration) 35.0 32.0 - 36.0 gm/dL   Platelet Count 720 849 - 450 10^3/uL   RDW-CV (Red Cell Distribution Width) 11.5 (L) 11.6 - 14.8 %   MPV (Mean Platelet Volume) 10.1 9.4 - 12.4 fl   Neutrophils 2.79 1.50 - 7.80 10^3/uL   Lymphocytes 2.46 1.00 - 3.60 10^3/uL   Monocytes 0.38 0.00 - 1.50 10^3/uL   Eosinophils 0.22 0.00 - 0.55 10^3/uL   Basophils 0.08 0.00 - 0.09 10^3/uL   Neutrophil % 47.0 32.0 - 70.0 %   Lymphocyte % 41.4 10.0 - 50.0 %   Monocyte % 6.4 4.0 - 13.0 %   Eosinophil % 3.7 1.0 - 5.0 %   Basophil% 1.3 0.0 - 2.0 %   Immature Granulocyte % 0.2 <=0.7 %   Immature Granulocyte Count 0.01 <=0.06 10^3/L      ROS:  General: No fever, chills or recent  illness. No change in weight HEENT: No change in vision or hearing. No pain or difficulty with swallowing, right ear pain and right ear fullness Respiratory: No cough or shortness of breath CV:  No chest pain or palpitations GI:  No pain, dyspepsia or change in bowel habits GU:  No dysuria, frequency, or hesitancy MSK:  No joint pain or injury Neurological: No headaches, changes in mental status, loss of sensation or strength, right facial and right upper arm spasm episodes with headaches Endocrine:  No heat or cold intolerance, polydipsia, polyuria Psychological: No anxiety insomnia or  depression  Objective:   Body mass index is 34.05 kg/m.  BP 112/76   Pulse 87   Wt 76.5 kg (168 lb 9.6 oz)   SpO2 98%   BMI 34.05 kg/m   General: WD/WN female, in no acute distress HEENT: Pupils equal and round, EOMI. oral mucosa moist.  Oropharynx clear. Neck: supple, trachea midline; no thyromegaly Respiratory:clear to auscultation.  No dullness to percussion.  No use of accessory muscles. Cardiac:  Regular rate and rhythm without murmur, gallops, or rubs+ Abdominal:soft, nontender, positive bowel sounds.  No organomegaly. Musculoskeletal:  No clubbing, cyanosis or edema.  Full range of motion in upper and lower extremities bilaterally. Neuro: CN grossly intact.  No acute decrease in sensation in the upper and lower extremities bilaterally.   Assessment/Plan:   Right trigeminal neuralgia  (primary encounter diagnosis) PTSD (post-traumatic stress disorder) Acute right otitis media Complicated migraine Acquired hypothyroidism  Assessment & Plan   Complex migraines versus complex seizures - She experiences episodes of facial muscle tightening, primarily on the right side, extending to the jaw, shoulder, and arm, associated with neck pain and limited range of motion. - Following with neurology.  Initially a diagnosis of trigeminal neuralgia was also entertained and patient has been tried on  various medications and it seems like none of them have helped her. - [facility] neurology referral request sent at patient's request - Continue monitoring at this time.  2.  Right otitis media- - Cefdinir  called in - Significant fluid in the right ear.   - Recommend avoiding submersion of head underwater until completion of antibiotics - Suggest using ear protection when swimming after infection resolves  3.  Generalized anxiety and post-traumatic stress disorder (PTSD) -Following up with psychiatry - Currently on Effexor  - Prazosin  was recently added because of nightmares - She takes Ritalin  for her ADD symptoms.  Monitor.    4. Hypothyroidism - She is on levothyroxine  with an increased dose on Sundays. -Labs are due to assess current dosing. - Order thyroid  function tests to assess current levothyroxine  dosing  5. Polycystic ovary syndrome (PCOS) She is under gynecological care for PCOS with adjustments to hormone medications, resulting in weight loss and resumption of menstrual periods. - Continue current management under gynecology     Follow-up with me in 3 months for follow-up of complex migraines and right ear infection      Goals   None     RADHIKA KALISETTI, MD  Portions of this note were created using dictation software and may contain typographical errors.

## 2023-11-24 NOTE — Progress Notes (Unsigned)
 Virtual Visit via Video Note  I connected with Paula [name] on 11/28/23 at  9:30 AM EDT by a video enabled telemedicine application and verified that I am speaking with the correct person using two identifiers.  Location: Patient: home Provider: home office Persons participated in the visit- patient, provider    I discussed the limitations of evaluation and management by telemedicine and the availability of in person appointments. The patient expressed understanding and agreed to proceed.    I discussed the assessment and treatment plan with the patient. The patient was provided an opportunity to ask questions and all were answered. The patient agreed with the plan and demonstrated an understanding of the instructions.   The patient was advised to call back or seek an in-person evaluation if the symptoms worsen or if the condition fails to improve as anticipated.    Katheren Sleet, MD     [facility] MD/PA/NP OP Progress Note  11/28/2023 11:53 AM Paula [name]  MRN:  [id number]  Chief Complaint:  Chief Complaint  Patient presents with   Follow-up   HPI:  - according to the chart review, she was referred to [facility] neurology for complex migraines vs complex seizures.  This is a follow-up appointment for depression, PTSD and insomnia.  She states that she feels tired.  She has no motivation to do anything.  Although she went to a beach, she wanted to lie down.  She states that she was very stressed at work.  She was working herself to death.  She felt she was set out for failure, feeling never enough.  She has not working since submitting a notice.  She reports recent encounter with the provider.  She felt traumatic, and felt weird with encounter, where she had seizure-like episode.  She will be seen by [facility] neurology.  She has not had any migraine since the procedure, although she used to have a migraine twice a day.  Her fatigue has not subsided despite this.  She states that she feels  more emotional since being on venlafaxine , although it allows her to have emotion.  She shares a recent episode of the conflict with her family.  She has significant difficulty in concentration.  She reports frustration that nobody has found out what is happening to her body.  She later apologized that she needed to vent.  She denies SI, HI, hallucinations.  She finds prazosin  has been helpful for nightmares, although she has not noticed any change in symptoms during the day.  She agrees with the plans as outlined below.   BP 112/76  Pulse 87  Wt 76.5 kg (168 lb 9.6 oz)  SpO2 98%  BMI 34.05 kg/m 10/2023   Substance use   Tobacco Alcohol Other substances/supplements  Current   A beer once a week Previously Ashwagandha, Primal queen  Past   History of binge drinking denies  Past Treatment           Visit Diagnosis:    ICD-10-CM   1. PTSD (post-traumatic stress disorder)  F43.10     2. MDD (major depressive disorder), recurrent episode, mild (HCC)  F33.0     3. GAD (generalized anxiety disorder)  F41.1     4. Attention deficit hyperactivity disorder (ADHD), unspecified ADHD type [F90.9]  F90.9     5. Insomnia, unspecified type  G47.00       Past Psychiatric History: Please see initial evaluation for full details. I have reviewed the history. No updates at this  time.     Past Medical History:  Past Medical History:  Diagnosis Date   ADHD    Anemia    Asthma    exercise induced   BULIMIA 01/17/2008   Annotation: AGE 35 Qualifier: Diagnosis of  By: Judyth LPN, Wanda     Complication of anesthesia    vomited during last c/s   CPD (cephalo-pelvic disproportion) 12/22/2014   Cystic fibrosis carrier in second trimester, antepartum 05/29/2014   Depression    Eczema    GERD (gastroesophageal reflux disease)    chronic   Headache    h/o migraines   Hypothyroidism    Nausea    Oligomenorrhea 12/22/2014   PCOS (polycystic ovarian syndrome)    PONV (postoperative nausea and  vomiting)    Rhinitis, allergic    Trigeminal neuralgia of right side of face     Past Surgical History:  Procedure Laterality Date   CESAREAN SECTION  04/19/2013   LTCS; CPD   CESAREAN SECTION N/A 10/27/2015   Procedure: REPEAT CESAREAN SECTION;  Surgeon: Gladis DELENA Dollar, MD;  Location: [facility] ORS;  Service: Obstetrics;  Laterality: N/A;   ENDOSCOPIC TURBINATE REDUCTION Bilateral 07/26/2017   Procedure: ENDOSCOPIC INFERIOR  TURBINATE REDUCTION;  Surgeon: Edda Mt, MD;  Location: [facility];  Service: ENT;  Laterality: Bilateral;   FRONTAL SINUS EXPLORATION Bilateral 07/26/2017   Procedure: FRONTAL SINUS EXPLORATION;  Surgeon: Edda Mt, MD;  Location: [facility];  Service: ENT;  Laterality: Bilateral;   IMAGE GUIDED SINUS SURGERY Bilateral 07/26/2017   Procedure: IMAGE GUIDED SINUS SURGERY;  Surgeon: Edda Mt, MD;  Location: [facility];  Service: ENT;  Laterality: Bilateral;  GAVE DISK TO CECE 2-12   SEPTOPLASTY WITH ETHMOIDECTOMY, AND MAXILLARY ANTROSTOMY Bilateral 07/26/2017   Procedure: SEPTOPLASTY WITH TOTAL  ETHMOIDECTOMY, AND MAXILLARY ANTROSTOMYWITH REMOVAL OF TISSUE,;  Surgeon: Juengel, Paul, MD;  Location: [facility];  Service: ENT;  Laterality: Bilateral;   TONSILLECTOMY     WISDOM TOOTH EXTRACTION      Family Psychiatric History: Please see initial evaluation for full details. I have reviewed the history. No updates at this time.     Family History:  Family History  Problem Relation Age of Onset   Endometriosis Mother    Bipolar disorder Mother    Alcohol abuse Mother    Heart disease Maternal Grandmother        valvular problem   Depression Maternal Grandmother    Breast cancer Neg Hx    Ovarian cancer Neg Hx    Colon cancer Neg Hx     Social History:  Social History   Socioeconomic History   Marital status: Married    Spouse name: Not on file   Number of children: Not on file   Years of education: Not on file    Highest education level: Not on file  Occupational History   Occupation: ED tech    Employer: [employer]  Tobacco Use   Smoking status: Never   Smokeless tobacco: Never  Vaping Use   Vaping status: Never Used  Substance and Sexual Activity   Alcohol use: Yes    Alcohol/week: 2.0 standard drinks of alcohol    Types: 2 Standard drinks or equivalent per week    Comment: Social   Drug use: No   Sexual activity: Yes    Partners: Male    Birth control/protection: Pill  Other Topics Concern   Not on file  Social History Narrative   Not on file  Social Drivers of Corporate investment banker Strain: Low Risk  (11/09/2023)   Received from [facility]   Overall Financial Resource Strain (CARDIA)    Difficulty of Paying Living Expenses: Not hard at all  Food Insecurity: No Food Insecurity (11/09/2023)   [facility]   Hunger Vital Sign    Within the past 12 months, you worried that your food would run out before you got the money to buy more.: Never true    Within the past 12 months, the food you bought just didn't last and you didn't have money to get more.: Never true  Transportation Needs: No Transportation Needs (11/09/2023)   Received from [facility]    In the past 12 months, has lack of transportation kept you from medical appointments or from getting medications?: No    Lack of Transportation (Non-Medical): No  Physical Activity: Not on file  Stress: Not on file  Social Connections: Not on file    Allergies:  Allergies  Allergen Reactions   Iodine Anaphylaxis    Contrast    Shellfish Allergy  Hives   Contrast Media [Iodinated Contrast Media]    Flagyl  [Metronidazole ]     Metabolic Disorder Labs: Lab Results  Component Value Date   HGBA1C 5.1 01/25/2023   Lab Results  Component Value Date   PROLACTIN 35.8 (H) 03/14/2016   PROLACTIN 43.4 12/27/2007   Lab Results   Component Value Date   CHOL 295 (H) 01/25/2023   TRIG 74 01/25/2023   HDL 55 01/25/2023   CHOLHDL 5.4 (H) 01/25/2023   VLDL 17 09/25/2011   LDLCALC 228 (H) 01/25/2023   LDLCALC 214 (H) 08/10/2021   Lab Results  Component Value Date   TSH 1.850 01/25/2023   TSH 1.040 09/05/2022    Therapeutic Level Labs: No results found for: LITHIUM No results found for: VALPROATE No results found for: CBMZ  Current Medications: Current Outpatient Medications  Medication Sig Dispense Refill   prazosin  (MINIPRESS ) 1 MG capsule Take 1 capsule (1 mg total) by mouth at bedtime. Total of 3 mg at night. Take along with 2 mg cap 30 capsule 1   albuterol (PROVENTIL HFA;VENTOLIN HFA) 108 (90 Base) MCG/ACT inhaler Inhale 2 puffs into the lungs every 6 (six) hours as needed for wheezing or shortness of breath.     ARAZLO 0.045 % LOTN Apply topically. (Patient not taking: Reported on 10/19/2022)     atorvastatin  (LIPITOR) 40 MG tablet Take 1 tablet (40 mg total) by mouth daily. 30 tablet 5   azelastine  (OPTIVAR ) 0.05 % ophthalmic solution 1 drop into affected eye Ophthalmic Twice a day 30 days 6 mL 3   carbamazepine  (TEGRETOL -XR) 100 MG 12 hr tablet Take 1 tablet (100 mg total) by mouth 2 (two) times daily. (Patient not taking: Reported on 08/13/2023) 60 tablet 2   clonazePAM (KLONOPIN) 0.5 MG tablet Take 0.5 mg by mouth at bedtime as needed for anxiety.     Drospirenone  (SLYND ) 4 MG TABS Take 1 tablet (4 mg total) by mouth daily. 84 tablet 3   Eflornithine HCl 13.9 % cream Apply a thin layer up to twice a day, at least 8 hours apart 45 g 6   EPINEPHrine  0.3 mg/0.3 mL IJ SOAJ injection as directed Injection prn 30 days 2 each 1   EPSOLAY 5 % cream Apply 1 Application topically every morning.     fluticasone  (FLONASE ) 50 MCG/ACT nasal  spray 1 spray each nostril Nasally Once a day 30 days 16 g 3   gabapentin  (NEURONTIN ) 100 MG capsule Take 1 capsule (100 mg total) by mouth 2 (two) times daily for 7 days,  THEN 2 capsules (200 mg total) 2 (two) times daily and continue this dose. 120 capsule 3   levocetirizine (XYZAL ) 5 MG tablet Take 1 tablet (5 mg total) by mouth daily. 30 tablet 3   levothyroxine  (SYNTHROID ) 75 MCG tablet Take 1 tablet (75 mcg total) by mouth daily before breakfast. 30 tablet 11   LORazepam  (ATIVAN ) 1 MG tablet Take 1 tablet (1 mg total) by mouth daily as needed for anxiety. (Patient not taking: Reported on 08/13/2023) 20 tablet 0   metformin (FORTAMET) 500 MG (OSM) 24 hr tablet Take 500 mg by mouth daily with breakfast.     metFORMIN (GLUMETZA) 500 MG (MOD) 24 hr tablet Take 500 mg by mouth daily with breakfast.     methylphenidate  (RITALIN ) 10 MG tablet Take 1 tablet (10 mg total) by mouth daily. 30 tablet 0   methylphenidate  (RITALIN ) 10 MG tablet Take 1 tablet (10 mg total) by mouth daily before breakfast. 30 tablet 0   mometasone  (ELOCON ) 0.1 % cream 1 application Externally Twice a day 30 days (Patient not taking: Reported on 10/19/2022) 90 g 1   montelukast (SINGULAIR) 10 MG tablet Take 10 mg by mouth as needed.     prazosin  (MINIPRESS ) 1 MG capsule Take 1 capsule (1 mg total) by mouth at bedtime for 3 days. 3 capsule 0   [START ON 12/11/2023] prazosin  (MINIPRESS ) 2 MG capsule Take 1 capsule (2 mg total) by mouth at bedtime. Start after completing 1 mg at night for 3 days 30 capsule 1   spironolactone  (ALDACTONE ) 100 MG tablet TAKE 2 TABLETS BY MOUTH DAILY (Patient not taking: Reported on 10/19/2022) 180 tablet 3   traZODone  (DESYREL ) 100 MG tablet Take 2 tablets (200 mg total) by mouth at bedtime as needed for sleep. 180 tablet 0   venlafaxine  XR (EFFEXOR -XR) 150 MG 24 hr capsule Take 1 capsule (150 mg total) by mouth daily with breakfast. Total of 225 mg daily. Take along with 75 mg cap 90 capsule 0   venlafaxine  XR (EFFEXOR -XR) 75 MG 24 hr capsule Take 1 capsule (75 mg total) by mouth daily with breakfast. Total of 225 mg daily. Take along with 150 mg cap 90 capsule 0    Current Facility-Administered Medications  Medication Dose Route Frequency Provider Last Rate Last Admin   cyanocobalamin  ((VITAMIN B-12)) injection 1,000 mcg  1,000 mcg Intramuscular Once Thompson, Annie, CNM         Musculoskeletal: Strength & Muscle Tone: N/A Gait & Station: N/A Patient leans: N/A  Psychiatric Specialty Exam: Review of Systems  Psychiatric/Behavioral:  Positive for decreased concentration and dysphoric mood. Negative for agitation, behavioral problems, confusion, hallucinations, self-injury, sleep disturbance and suicidal ideas. The patient is nervous/anxious. The patient is not hyperactive.   All other systems reviewed and are negative.   There were no vitals taken for this visit.There is no height or weight on file to calculate BMI.  General Appearance: Well Groomed  Eye Contact:  Good  Speech:  Clear and Coherent  Volume:  Normal  Mood:  emotional  Affect:  Appropriate, Congruent, and Labile  Thought Process:  Coherent  Orientation:  Full (Time, Place, and Person)  Thought Content: Logical   Suicidal Thoughts:  No  Homicidal Thoughts:  No  Memory:  Immediate;  Good  Judgement:  Good  Insight:  Fair  Psychomotor Activity:  Normal  Concentration:  Concentration: Good and Attention Span: Good  Recall:  Good  Fund of Knowledge: Good  Language: Good  Akathisia:  No  Handed:  Right  AIMS (if indicated): not done  Assets:  Communication Skills Desire for Improvement  ADL's:  Intact  Cognition: WNL  Sleep:  Fair   Screenings: GAD-7    Flowsheet Row Office Visit from 03/06/2023 in [facility] Office Visit from 06/16/2020 in [facility] Office Visit from 04/28/2020 in [facility]  Total GAD-7 Score 0 7 8   PHQ2-9    Flowsheet Row Office Visit from 03/06/2023 in [facility] Office Visit from 11/09/2022 in [facility] Office Visit from 10/20/2021 in [facility] Video Visit from 07/27/2021 in [facility] Video Visit from 03[facility]30/2021 in [facility]  PHQ-2 Total Score 0 0 0 1 0  PHQ-9 Total Score -- -- 1 -- --   Flowsheet Row ED from 07/08/2023 in [facility] ED from 05/04/2023 in [facility] Video Visit from 03/30/2021 in [facility]  C-SSRS RISK CATEGORY No Risk No Risk No Risk     Assessment and Plan:  Paula [name] is a 43 y.o. year old female with a history of PTSD, depression, anxiety, GERD, hypothyroidism, PCOS, who presents for follow up appointment for below.   1. PTSD (post-traumatic stress disorder) 2. MDD (major depressive disorder), recurrent episode, mild (HCC) 3. GAD (generalized anxiety disorder) Acute stressors include: conflict with her daughter, occasional marital conflict, recent overdose of her mother, concern about her grandfather with memory loss  Other stressors include: childhood trauma, neck pain  She reports slight worsening in her emotional lability, although there has been improvement in nightmares since starting prazosin .  Will continue current dose of venlafaxine  to target PTSD, depression and anxiety.  Will uptitrate prazosin  to optimize treatment for nightmares, hyperarousal symptoms.  Discussed potential risk of orthostatic hypotension, dizziness.   4. Attention deficit hyperactivity disorder (ADHD), unspecified ADHD type [F90.9] - neuropsych testing on 10/26/2022. Dx- ADHD, combined presentation, severe. UDS negative  10/2022      Significant worsening in concentration.  Although she requests to adjust her medication, there is a concern of possible seizure disorder.  It is noted that her neurologist, Dr. Lane discontinued the  Focalin  due to concern about his adverse reaction, and she has an appointment at [facility] neurology for evaluation of seizure.  Will maintain on the current dose of Ritalin  at this time to target ADHD.   # fatigue Newly addressed.  She reports significant worsening in fatigue in the past month.  It is multifactorial, including headache/possible migraine, although she denies any headache in the last 2 weeks, and she has been off Focalin  since Feb due to concern of headache, possible seizure.  Will titrate prazosin  as outlined above to target hyperarousal symptoms, which may be contributing to her fatigue.  Will monitor any possible adverse reaction of fatigue from this medication.   # Insomnia She reports improvement in nightmares since starting prazosin .  Will continue the current dose along with trazodone  as needed for insomnia.   # Insomnia Unchanged.  Prazosin  will be started as outlined above.  Will continue trazodone  as needed for  insomnia.    # Alcohol use Improving.  She denies any recent episode of binge drinking. She denies any craving for alcohol. Will continue to assess.    Plan - she would like all meds to be sent to [facility] Continue venlafaxine  225 mg daily Increase prazosin  3 mg at night  Continue Trazodone  12.5 mg at night as needed for insomnia- drowsiness from higher dose Continue Ritalin  10 mg daily - refill left Hold Focalin  (was on 15 mg) - headache Next appointment: 8/15 at 10 am, video   Past trials of medication: sertraline , fluoxetine  (nightmares), L-theanine , Ambien , Trazodone    The patient demonstrates the following risk factors for suicide: Chronic risk factors for suicide include: psychiatric disorder of depression, anxiety. Acute risk factors for suicide include: N/A. Protective factors for this patient include: positive social support, coping skills and hope for the future. Considering these factors, the overall suicide risk at this point appears to be low. Patient  is appropriate for outpatient follow up.    Collaboration of Care: Collaboration of Care: Other reviewed notes in Epic  Patient/Guardian was advised Release of Information must be obtained prior to any record release in order to collaborate their care with an outside provider. Patient/Guardian was advised if they have not already done so to contact the registration department to sign all necessary forms in order for us  to release information regarding their care.   Consent: Patient/Guardian gives verbal consent for treatment and assignment of benefits for services provided during this visit. Patient/Guardian expressed understanding and agreed to proceed.    Katheren Sleet, MD 11/28/2023, 11:53 AM

## 2023-11-28 ENCOUNTER — Encounter: Payer: Self-pay | Admitting: Psychiatry

## 2023-11-28 ENCOUNTER — Telehealth: Admitting: Psychiatry

## 2023-11-28 DIAGNOSIS — F4310 Post-traumatic stress disorder, unspecified: Secondary | ICD-10-CM | POA: Diagnosis not present

## 2023-11-28 DIAGNOSIS — G4700 Insomnia, unspecified: Secondary | ICD-10-CM

## 2023-11-28 DIAGNOSIS — F330 Major depressive disorder, recurrent, mild: Secondary | ICD-10-CM | POA: Diagnosis not present

## 2023-11-28 DIAGNOSIS — F909 Attention-deficit hyperactivity disorder, unspecified type: Secondary | ICD-10-CM | POA: Diagnosis not present

## 2023-11-28 DIAGNOSIS — F411 Generalized anxiety disorder: Secondary | ICD-10-CM | POA: Diagnosis not present

## 2023-11-28 MED ORDER — PRAZOSIN HCL 1 MG PO CAPS: 1.0000 mg | ORAL_CAPSULE | Freq: Every day | ORAL | 1 refills | Status: DC

## 2023-11-28 MED ORDER — PRAZOSIN HCL 2 MG PO CAPS: 2.0000 mg | ORAL_CAPSULE | Freq: Every day | ORAL | 1 refills | Status: DC

## 2023-12-06 ENCOUNTER — Other Ambulatory Visit: Payer: Self-pay | Admitting: Psychiatry

## 2023-12-07 ENCOUNTER — Encounter (INDEPENDENT_AMBULATORY_CARE_PROVIDER_SITE_OTHER): Payer: Self-pay

## 2023-12-07 DIAGNOSIS — F411 Generalized anxiety disorder: Secondary | ICD-10-CM | POA: Diagnosis not present

## 2023-12-07 DIAGNOSIS — F331 Major depressive disorder, recurrent, moderate: Secondary | ICD-10-CM

## 2023-12-07 MED ORDER — BUSPIRONE HCL 5 MG PO TABS: 5.0000 mg | ORAL_TABLET | Freq: Two times a day (BID) | ORAL | 1 refills | Status: DC

## 2023-12-07 NOTE — Telephone Encounter (Signed)
 This Clinical research associate called the patient.  She states that she has been feeling more depressed.  She does not want to get out of the bed, and is doing bare minimum.  Although her husband asks to go out for walk, she is unable to do it due to anxiety.  She feels like a burden on her family, although she denies SI.  She is concerned about starting medication which can cause weight gain.  She has not started higher dose of prazosin  yet due to having some difficulty filling the medication.  She agrees with the plans as outlined below.   - start buspar  5 mg twice a day - continue prazosin  2 mg at night   Patient- home Provider- home office I've spent a total time of 11 minutes providing service to this patient-generated inquiry in the MyChart message

## 2023-12-12 ENCOUNTER — Other Ambulatory Visit: Payer: Self-pay | Admitting: Psychiatry

## 2023-12-12 NOTE — Telephone Encounter (Signed)
Called patient no answer left voicemail for patient to return call to office 

## 2023-12-12 NOTE — Telephone Encounter (Signed)
 Could you double-check the dose of trazodone  she's taking? I wonder if she might prefer the 50 mg tablet, since she's on a lower dose.

## 2023-12-14 NOTE — Telephone Encounter (Signed)
Called patient no answer left voicemail for patient to return call to office 

## 2023-12-28 ENCOUNTER — Other Ambulatory Visit: Payer: Self-pay | Admitting: Psychiatry

## 2024-01-05 ENCOUNTER — Other Ambulatory Visit: Payer: Self-pay | Admitting: Psychiatry

## 2024-01-05 NOTE — Progress Notes (Signed)
 Virtual Visit via Video Note  I connected with [name] on 01/11/24 at 10:00 AM EDT by a video enabled telemedicine application and verified that I am speaking with the correct person using two identifiers.  Location: Patient: home Provider: home office Persons participated in the visit- patient, provider    I discussed the limitations of evaluation and management by telemedicine and the availability of in person appointments. The patient expressed understanding and agreed to proceed.    I discussed the assessment and treatment plan with the patient. The patient was provided an opportunity to ask questions and all were answered. The patient agreed with the plan and demonstrated an understanding of the instructions.   The patient was advised to call back or seek an in-person evaluation if the symptoms worsen or if the condition fails to improve as anticipated.    Paula Sleet, MD    [facility] MD/PA/NP OP Progress Note  01/11/2024 12:13 PM Paula [name]  MRN:  [id number]  Chief Complaint:  Chief Complaint  Patient presents with   Follow-up   HPI:  This is a follow-up appointment for PTSD, depression, anxiety and ADHD.  She states that she has been doing okay.  She went to see her grandfather, who has dementia.  He is like a father figure.  It has been difficult to see him as he was doing business up until 2 years ago.  However, she also reports happiness to see the pictures of them together.  She appeared to be very happy, although she does not have a recollection of this.  She was also able to see her paternal aunt.  She never met her father, and that they have found a lot of commonality related to abandonment.  She states that she was highly emotional, and was exhausted afterwards.  She was wondering what was wrong with her.  She also states that there was a lot to give in, although she usually does not do so as she is usually very scared.  She also reach out to CPS to find  out the report about her own self.  It was declined.  Validated her feelings. This provider also raised concerns about the potential impact of pursuing this report on the patient and her life. The patient agreed to first establish care with a therapist to work through.  She states that although she feels sad, it has been better since taking BuSpar .  She also felt she was not needed, although she now feels this way, taking care of her children who goes to school.  She denies SI, HI, hallucinations.  She reports significant difficulty with concentration.  She asks if any long acting stimulant can be prescribed.  She expressed understanding that given her current condition, this writer would not be able to prescribe this medication until she is medically cleared.  She agrees with the plans as outlined below.   Substance use   Tobacco Alcohol Other substances/supplements  Current   Once in two weeks Previously Ashwagandha, Primal queen  Past   History of binge drinking denies  Past Treatment           Visit Diagnosis:    ICD-10-CM   1. PTSD (post-traumatic stress disorder)  F43.10     2. MDD (major depressive disorder), recurrent episode, moderate (HCC) [F33.1]  F33.1     3. GAD (generalized anxiety disorder) [F41.1]  F41.1     4. Attention deficit hyperactivity disorder (ADHD), unspecified ADHD type [F90.9]  F90.9  Past Psychiatric History: Please see initial evaluation for full details. I have reviewed the history. No updates at this time.     Past Medical History:  Past Medical History:  Diagnosis Date   ADHD    Anemia    Asthma    exercise induced   BULIMIA 01/17/2008   Annotation: AGE 61 Qualifier: Diagnosis of  By: Judyth LPN, Wanda     Complication of anesthesia    vomited during last c/s   CPD (cephalo-pelvic disproportion) 12/22/2014   Cystic fibrosis carrier in second trimester, antepartum 05/29/2014   Depression    Eczema    GERD (gastroesophageal reflux disease)     chronic   Headache    h/o migraines   Hypothyroidism    Nausea    Oligomenorrhea 12/22/2014   PCOS (polycystic ovarian syndrome)    PONV (postoperative nausea and vomiting)    Rhinitis, allergic    Trigeminal neuralgia of right side of face     Past Surgical History:  Procedure Laterality Date   CESAREAN SECTION  04/19/2013   LTCS; CPD   CESAREAN SECTION N/A 10/27/2015   Procedure: REPEAT CESAREAN SECTION;  Surgeon: Gladis DELENA Dollar, MD;  Location: [facility] ORS;  Service: Obstetrics;  Laterality: N/A;   ENDOSCOPIC TURBINATE REDUCTION Bilateral 07/26/2017   Procedure: ENDOSCOPIC INFERIOR  TURBINATE REDUCTION;  Surgeon: Edda Mt, MD;  Location: [facility];  Service: ENT;  Laterality: Bilateral;   FRONTAL SINUS EXPLORATION Bilateral 07/26/2017   Procedure: FRONTAL SINUS EXPLORATION;  Surgeon: Edda Mt, MD;  Location: [facility];  Service: ENT;  Laterality: Bilateral;   IMAGE GUIDED SINUS SURGERY Bilateral 07/26/2017   Procedure: IMAGE GUIDED SINUS SURGERY;  Surgeon: Edda Mt, MD;  Location: [facility];  Service: ENT;  Laterality: Bilateral;  GAVE DISK TO CECE 2-12   SEPTOPLASTY WITH ETHMOIDECTOMY, AND MAXILLARY ANTROSTOMY Bilateral 07/26/2017   Procedure: SEPTOPLASTY WITH TOTAL  ETHMOIDECTOMY, AND MAXILLARY ANTROSTOMYWITH REMOVAL OF TISSUE,;  Surgeon: Juengel, Paul, MD;  Location: [facility];  Service: ENT;  Laterality: Bilateral;   TONSILLECTOMY     WISDOM TOOTH EXTRACTION      Family Psychiatric History: Please see initial evaluation for full details. I have reviewed the history. No updates at this time.     Family History:  Family History  Problem Relation Age of Onset   Endometriosis Mother    Bipolar disorder Mother    Alcohol abuse Mother    Heart disease Maternal Grandmother        valvular problem   Depression Maternal Grandmother    Breast cancer Neg Hx    Ovarian cancer Neg Hx    Colon cancer Neg Hx     Social History:   Social History   Socioeconomic History   Marital status: Married    Spouse name: Not on file   Number of children: Not on file   Years of education: Not on file   Highest education level: Not on file  Occupational History   Occupation: ED tech    Employer: [employer]  Tobacco Use   Smoking status: Never   Smokeless tobacco: Never  Vaping Use   Vaping status: Never Used  Substance and Sexual Activity   Alcohol use: Yes    Alcohol/week: 2.0 standard drinks of alcohol    Types: 2 Standard drinks or equivalent per week    Comment: Social   Drug use: No   Sexual activity: Yes    Partners: Male    Birth control/protection:  Pill  Other Topics Concern   Not on file  Social History Narrative   Not on file   Social Drivers of Health   Financial Resource Strain: Low Risk  (12/21/2023)   Received from [facility]   Overall Financial Resource Strain (CARDIA)    Difficulty of Paying Living Expenses: Not hard at all  Food Insecurity: No Food Insecurity (12/21/2023)   Received from [facility]   Hunger Vital Sign    Within the past 12 months, you worried that your food would run out before you got the money to buy more.: Never true    Within the past 12 months, the food you bought just didn't last and you didn't have money to get more.: Never true  Transportation Needs: No Transportation Needs (12/21/2023)   Received from [facility] - Transportation    In the past 12 months, has lack of transportation kept you from medical appointments or from getting medications?: No    Lack of Transportation (Non-Medical): No  Physical Activity: Not on file  Stress: Not on file  Social Connections: Not on file    Allergies:  Allergies  Allergen Reactions   Iodine Anaphylaxis    Contrast    Shellfish Allergy  Hives   Contrast Media [Iodinated Contrast Media]    Flagyl  [Metronidazole ]     Metabolic Disorder Labs: Lab Results   Component Value Date   HGBA1C 5.1 01/25/2023   Lab Results  Component Value Date   PROLACTIN 35.8 (H) 03/14/2016   PROLACTIN 43.4 12/27/2007   Lab Results  Component Value Date   CHOL 295 (H) 01/25/2023   TRIG 74 01/25/2023   HDL 55 01/25/2023   CHOLHDL 5.4 (H) 01/25/2023   VLDL 17 09/25/2011   LDLCALC 228 (H) 01/25/2023   LDLCALC 214 (H) 08/10/2021   Lab Results  Component Value Date   TSH 1.850 01/25/2023   TSH 1.040 09/05/2022    Therapeutic Level Labs: No results found for: LITHIUM No results found for: VALPROATE No results found for: CBMZ  Current Medications: Current Outpatient Medications  Medication Sig Dispense Refill   albuterol (PROVENTIL HFA;VENTOLIN HFA) 108 (90 Base) MCG/ACT inhaler Inhale 2 puffs into the lungs every 6 (six) hours as needed for wheezing or shortness of breath.     ARAZLO 0.045 % LOTN Apply topically. (Patient not taking: Reported on 10/19/2022)     atorvastatin  (LIPITOR) 40 MG tablet Take 1 tablet (40 mg total) by mouth daily. 30 tablet 5   azelastine  (OPTIVAR ) 0.05 % ophthalmic solution 1 drop into affected eye Ophthalmic Twice a day 30 days 6 mL 3   [START ON 02/05/2024] busPIRone  (BUSPAR ) 5 MG tablet Take 1 tablet (5 mg total) by mouth 2 (two) times daily. 180 tablet 0   carbamazepine  (TEGRETOL -XR) 100 MG 12 hr tablet Take 1 tablet (100 mg total) by mouth 2 (two) times daily. (Patient not taking: Reported on 08/13/2023) 60 tablet 2   clonazePAM (KLONOPIN) 0.5 MG tablet Take 0.5 mg by mouth at bedtime as needed for anxiety.     Drospirenone  (SLYND ) 4 MG TABS Take 1 tablet (4 mg total) by mouth daily. 84 tablet 3   Eflornithine HCl 13.9 % cream Apply a thin layer up to twice a day, at least 8 hours apart 45 g 6   EPINEPHrine  0.3 mg/0.3 mL IJ SOAJ injection as directed Injection prn 30 days 2 each 1   EPSOLAY 5 % cream Apply  1 Application topically every morning.     fluticasone  (FLONASE ) 50 MCG/ACT nasal spray 1 spray each nostril  Nasally Once a day 30 days 16 g 3   gabapentin  (NEURONTIN ) 100 MG capsule Take 1 capsule (100 mg total) by mouth 2 (two) times daily for 7 days, THEN 2 capsules (200 mg total) 2 (two) times daily and continue this dose. 120 capsule 3   levocetirizine (XYZAL ) 5 MG tablet Take 1 tablet (5 mg total) by mouth daily. 30 tablet 3   levothyroxine  (SYNTHROID ) 75 MCG tablet Take 1 tablet (75 mcg total) by mouth daily before breakfast. 30 tablet 11   LORazepam  (ATIVAN ) 1 MG tablet Take 1 tablet (1 mg total) by mouth daily as needed for anxiety. (Patient not taking: Reported on 08/13/2023) 20 tablet 0   metformin (FORTAMET) 500 MG (OSM) 24 hr tablet Take 500 mg by mouth daily with breakfast.     metFORMIN (GLUMETZA) 500 MG (MOD) 24 hr tablet Take 500 mg by mouth daily with breakfast.     mometasone  (ELOCON ) 0.1 % cream 1 application Externally Twice a day 30 days (Patient not taking: Reported on 10/19/2022) 90 g 1   montelukast (SINGULAIR) 10 MG tablet Take 10 mg by mouth as needed.     prazosin  (MINIPRESS ) 1 MG capsule Take 1 capsule (1 mg total) by mouth at bedtime for 3 days. 3 capsule 0   prazosin  (MINIPRESS ) 1 MG capsule Take 1 capsule (1 mg total) by mouth at bedtime. Total of 3 mg at night. Take along with 2 mg cap 30 capsule 1   prazosin  (MINIPRESS ) 2 MG capsule Take 1 capsule (2 mg total) by mouth at bedtime. Start after completing 1 mg at night for 3 days 30 capsule 1   spironolactone  (ALDACTONE ) 100 MG tablet TAKE 2 TABLETS BY MOUTH DAILY (Patient not taking: Reported on 10/19/2022) 180 tablet 3   traZODone  (DESYREL ) 100 MG tablet Take 2 tablets (200 mg total) by mouth at bedtime as needed for sleep. 180 tablet 0   Venlafaxine  HCl 225 MG TB24 Take 1 tablet (225 mg total) by mouth daily. 90 tablet 0   venlafaxine  XR (EFFEXOR -XR) 150 MG 24 hr capsule Take 1 capsule (150 mg total) by mouth daily with breakfast. Total of 225 mg daily. Take along with 75 mg cap 90 capsule 0   venlafaxine  XR (EFFEXOR -XR) 75  MG 24 hr capsule Take 1 capsule (75 mg total) by mouth daily with breakfast. Total of 225 mg daily. Take along with 150 mg cap 90 capsule 0   Current Facility-Administered Medications  Medication Dose Route Frequency Provider Last Rate Last Admin   cyanocobalamin  ((VITAMIN B-12)) injection 1,000 mcg  1,000 mcg Intramuscular Once Thompson, Annie, CNM         Musculoskeletal: Strength & Muscle Tone: N/A Gait & Station: N/A Patient leans: N/A  Psychiatric Specialty Exam: Review of Systems  Psychiatric/Behavioral:  Positive for decreased concentration and dysphoric mood. Negative for agitation, behavioral problems, confusion, hallucinations, self-injury, sleep disturbance and suicidal ideas. The patient is nervous/anxious. The patient is not hyperactive.   All other systems reviewed and are negative.   There were no vitals taken for this visit.There is no height or weight on file to calculate BMI.  General Appearance: Well Groomed  Eye Contact:  Good  Speech:  Clear and Coherent  Volume:  Normal  Mood:  better  Affect:  Appropriate, Congruent, and Tearful  Thought Process:  Coherent  Orientation:  Full (Time, Place,  and Person)  Thought Content: Logical   Suicidal Thoughts:  No  Homicidal Thoughts:  No  Memory:  Immediate;   Good  Judgement:  Good  Insight:  Good  Psychomotor Activity:  Normal  Concentration:  Concentration: Good and Attention Span: Good  Recall:  Good  Fund of Knowledge: Good  Language: Good  Akathisia:  No  Handed:  Right  AIMS (if indicated): not done  Assets:  Communication Skills Desire for Improvement  ADL's:  Intact  Cognition: WNL  Sleep:  Fair   Screenings: GAD-7    Flowsheet Row Office Visit from 03/06/2023 in [facility] Office Visit from 06/16/2020 in [facility] Office Visit from 04/28/2020 in [facility]  Total GAD-7 Score 0 7 8   PHQ2-9    Flowsheet Row Office Visit from  03/06/2023 in [facility] Office Visit from 11/09/2022 in [facility] Office Visit from 10/20/2021 in [facility] Video Visit from 07/27/2021 in [facility] Video Visit from 03/30/2021 in [facility]  PHQ-2 Total Score 0 0 0 1 0  PHQ-9 Total Score -- -- 1 -- --   Flowsheet Row ED from 07/08/2023 in [facility] at [facility] ED from 05/04/2023 in [facility] Department at [facility] Video Visit from 03/30/2021 in [facility]  C-SSRS RISK CATEGORY No Risk No Risk No Risk     Assessment and Plan:  Paula [name] is a 43 y.o. year old female with a history of PTSD, depression, anxiety, GERD, hypothyroidism, PCOS, who presents for follow up appointment for below.   1. PTSD (post-traumatic stress disorder) 2. MDD (major depressive disorder), recurrent episode, moderate (HCC) [F33.1] 3. GAD (generalized anxiety disorder) [F41.1] Acute stressors include: conflict with her daughter, occasional marital conflict, recent overdose of her mother, concern about her grandfather with memory loss  Other stressors include: childhood trauma, neck pain  She reports overall improvement in emotional lability, depressive symptoms since starting BuSpar , although she continues to have PTSD symptoms.  She recently reconnected with her paternal aunt and was able to visit her grandparents, who had played a parental role in her life.  Will continue current medication regimen at this time.  Will continue venlafaxine  to target PTSD, depression and anxiety.  Will continue prazosin  for hyperarousal symptoms related to PTSD.  Will continue BuSpar  for anxiety.  She will greatly benefit from CBT/DBT; she was advised to look up psychology today especially  given she is interested in Christian-based therapy.   4. Attention deficit hyperactivity disorder (ADHD), unspecified ADHD type [F90.9] - neuropsych testing on 10/26/2022. Dx- ADHD, combined presentation, severe. UDS negative  10/2022   - focalin  was discontinued by Dr. Brendan due to concern of muscle contraction and spasm in Feb She continues to struggle with concentration especially since being off Focalin .  It was discussed with the patient that this provider will not be able to restart this medication or any other stimulant due to medical concerns regarding potential adverse reactions. She has an upcoming appointment at [facility] neurology for evaluation of seizure.  She agrees to pursue this.  Given she reports limited benefit from Ritalin , we will discontinue this at this time.    # Alcohol use Improving.  She denies any recent episode of binge drinking. She denies any craving for alcohol. Will continue to assess.  Plan - she would like all meds to be sent to [facility] Continue venlafaxine  225 mg daily Continue buspar  5 mg twice a day Continue prazosin  2 mg at night  Continue Trazodone  12.5 mg at night as needed for insomnia- drowsiness from higher dose Hold ritalin  (was on 10 mg) Next appointment:  10/10 at 9 30, video   Past trials of medication: sertraline , fluoxetine  (nightmares), L-theanine , Ambien , Trazodone    The patient demonstrates the following risk factors for suicide: Chronic risk factors for suicide include: psychiatric disorder of depression, anxiety. Acute risk factors for suicide include: N/A. Protective factors for this patient include: positive social support, coping skills and hope for the future. Considering these factors, the overall suicide risk at this point appears to be low. Patient is appropriate for outpatient follow up.      Collaboration of Care: Collaboration of Care: Other reviewed notes in Epic  Patient/Guardian was advised Release of Information must be  obtained prior to any record release in order to collaborate their care with an outside provider. Patient/Guardian was advised if they have not already done so to contact the registration department to sign all necessary forms in order for us  to release information regarding their care.   Consent: Patient/Guardian gives verbal consent for treatment and assignment of benefits for services provided during this visit. Patient/Guardian expressed understanding and agreed to proceed.    Paula Sleet, MD 01/11/2024, 12:13 PM

## 2024-01-11 ENCOUNTER — Encounter: Payer: Self-pay | Admitting: Psychiatry

## 2024-01-11 ENCOUNTER — Telehealth (INDEPENDENT_AMBULATORY_CARE_PROVIDER_SITE_OTHER): Admitting: Psychiatry

## 2024-01-11 DIAGNOSIS — F331 Major depressive disorder, recurrent, moderate: Secondary | ICD-10-CM

## 2024-01-11 DIAGNOSIS — F909 Attention-deficit hyperactivity disorder, unspecified type: Secondary | ICD-10-CM

## 2024-01-11 DIAGNOSIS — F4310 Post-traumatic stress disorder, unspecified: Secondary | ICD-10-CM

## 2024-01-11 DIAGNOSIS — F411 Generalized anxiety disorder: Secondary | ICD-10-CM | POA: Diagnosis not present

## 2024-01-11 MED ORDER — BUSPIRONE HCL 5 MG PO TABS: 5.0000 mg | ORAL_TABLET | Freq: Two times a day (BID) | ORAL | 0 refills | Status: DC

## 2024-01-11 MED ORDER — VENLAFAXINE HCL ER 75 MG PO CP24: 75.0000 mg | ORAL_CAPSULE | Freq: Every day | ORAL | 0 refills | Status: DC

## 2024-01-11 NOTE — Patient Instructions (Signed)
 Continue venlafaxine  225 mg daily Continue buspar  5 mg twice a day Continue prazosin  2 mg at night  Continue Trazodone  12.5 mg at night as needed for insomnia Hold ritalin  (was on 10 mg) Next appointment:  10/10 at 9 30

## 2024-01-22 ENCOUNTER — Other Ambulatory Visit: Payer: Self-pay | Admitting: Internal Medicine

## 2024-01-22 DIAGNOSIS — G514 Facial myokymia: Secondary | ICD-10-CM

## 2024-01-22 DIAGNOSIS — G5131 Clonic hemifacial spasm, right: Secondary | ICD-10-CM

## 2024-01-26 ENCOUNTER — Ambulatory Visit
Admission: RE | Admit: 2024-01-26 | Discharge: 2024-01-26 | Disposition: A | Discharge status: Home / Self Care | Source: Ambulatory Visit | Attending: Internal Medicine | Admitting: Internal Medicine

## 2024-01-26 DIAGNOSIS — G5131 Clonic hemifacial spasm, right: Secondary | ICD-10-CM | POA: Diagnosis present

## 2024-01-26 DIAGNOSIS — G514 Facial myokymia: Secondary | ICD-10-CM | POA: Insufficient documentation

## 2024-01-26 MED ORDER — GADOBUTROL 1 MMOL/ML IV SOLN
7.0000 mL | Freq: Once | INTRAVENOUS | Status: AC | PRN
  Administered 2024-01-26: 7 mL via INTRAVENOUS

## 2024-02-02 ENCOUNTER — Other Ambulatory Visit: Payer: Self-pay | Admitting: Psychiatry

## 2024-03-01 NOTE — Progress Notes (Deleted)
 BH MD/PA/NP OP Progress Note  03/01/2024 5:04 PM Paula [name]  MRN:  [id number]  Chief Complaint: No chief complaint on file.  HPI: ***   Substance use   Tobacco Alcohol Other substances/supplements  Current   Once in two weeks Previously Ashwagandha, Primal queen  Past   History of binge drinking denies  Past Treatment             Visit Diagnosis: No diagnosis found.  Past Psychiatric History: Please see initial evaluation for full details. I have reviewed the history. No updates at this time.     Past Medical History:  Past Medical History:  Diagnosis Date   ADHD    Anemia    Asthma    exercise induced   BULIMIA 01/17/2008   Annotation: AGE 54 Qualifier: Diagnosis of  By: Judyth LPN, Wanda     Complication of anesthesia    vomited during last c/s   CPD (cephalo-pelvic disproportion) 12/22/2014   Cystic fibrosis carrier in second trimester, antepartum 05/29/2014   Depression    Eczema    GERD (gastroesophageal reflux disease)    chronic   Headache    h/o migraines   Hypothyroidism    Nausea    Oligomenorrhea 12/22/2014   PCOS (polycystic ovarian syndrome)    PONV (postoperative nausea and vomiting)    Rhinitis, allergic    Trigeminal neuralgia of right side of face     Past Surgical History:  Procedure Laterality Date   CESAREAN SECTION  04/19/2013   LTCS; CPD   CESAREAN SECTION N/A 10/27/2015   Procedure: REPEAT CESAREAN SECTION;  Surgeon: Gladis DELENA Dollar, MD;  Location: [facility] ORS;  Service: Obstetrics;  Laterality: N/A;   ENDOSCOPIC TURBINATE REDUCTION Bilateral 07/26/2017   Procedure: ENDOSCOPIC INFERIOR  TURBINATE REDUCTION;  Surgeon: Edda Mt, MD;  Location: [facility];  Service: ENT;  Laterality: Bilateral;   FRONTAL SINUS EXPLORATION Bilateral 07/26/2017   Procedure: FRONTAL SINUS EXPLORATION;  Surgeon: Edda Mt, MD;  Location: [facility];  Service: ENT;  Laterality: Bilateral;   IMAGE GUIDED SINUS SURGERY Bilateral  07/26/2017   Procedure: IMAGE GUIDED SINUS SURGERY;  Surgeon: Edda Mt, MD;  Location: [facility];  Service: ENT;  Laterality: Bilateral;  GAVE DISK TO CECE 2-12   SEPTOPLASTY WITH ETHMOIDECTOMY, AND MAXILLARY ANTROSTOMY Bilateral 07/26/2017   Procedure: SEPTOPLASTY WITH TOTAL  ETHMOIDECTOMY, AND MAXILLARY ANTROSTOMYWITH REMOVAL OF TISSUE,;  Surgeon: Juengel, Paul, MD;  Location: [facility];  Service: ENT;  Laterality: Bilateral;   TONSILLECTOMY     WISDOM TOOTH EXTRACTION      Family Psychiatric History: Please see initial evaluation for full details. I have reviewed the history. No updates at this time.     Family History:  Family History  Problem Relation Age of Onset   Endometriosis Mother    Bipolar disorder Mother    Alcohol abuse Mother    Heart disease Maternal Grandmother        valvular problem   Depression Maternal Grandmother    Breast cancer Neg Hx    Ovarian cancer Neg Hx    Colon cancer Neg Hx     Social History:  Social History   Socioeconomic History   Marital status: Married    Spouse name: Not on file   Number of children: Not on file   Years of education: Not on file   Highest education level: Not on file  Occupational History   Occupation: ED tech    Employer:  [employer]  Tobacco Use   Smoking status: Never   Smokeless tobacco: Never  Vaping Use   Vaping status: Never Used  Substance and Sexual Activity   Alcohol use: Yes    Alcohol/week: 2.0 standard drinks of alcohol    Types: 2 Standard drinks or equivalent per week    Comment: Social   Drug use: No   Sexual activity: Yes    Partners: Male    Birth control/protection: Pill  Other Topics Concern   Not on file  Social History Narrative   Not on file   Social Drivers of Health   Financial Resource Strain: Low Risk  (12/21/2023)   Received from [facility]   Overall Financial Resource Strain (CARDIA)    Difficulty of Paying Living Expenses: Not hard at  all  Food Insecurity: No Food Insecurity (12/21/2023)   [facility] from [facility]   Hunger Vital Sign    Within the past 12 months, you worried that your food would run out before you got the money to buy more.: Never true    Within the past 12 months, the food you bought just didn't last and you didn't have money to get more.: Never true  Transportation Needs: No Transportation Needs (12/21/2023)   Received from [facility] - Transportation    In the past 12 months, has lack of transportation kept you from medical appointments or from getting medications?: No    Lack of Transportation (Non-Medical): No  Physical Activity: Not on file  Stress: Not on file  Social Connections: Not on file    Allergies:  Allergies  Allergen Reactions   Iodine Anaphylaxis    Contrast    Shellfish Allergy  Hives   Contrast Media [Iodinated Contrast Media]    Flagyl  [Metronidazole ]     Metabolic Disorder Labs: Lab Results  Component Value Date   HGBA1C 5.1 01/25/2023   Lab Results  Component Value Date   PROLACTIN 35.8 (H) 03/14/2016   PROLACTIN 43.4 12/27/2007   Lab Results  Component Value Date   CHOL 295 (H) 01/25/2023   TRIG 74 01/25/2023   HDL 55 01/25/2023   CHOLHDL 5.4 (H) 01/25/2023   VLDL 17 09/25/2011   LDLCALC 228 (H) 01/25/2023   LDLCALC 214 (H) 08/10/2021   Lab Results  Component Value Date   TSH 1.850 01/25/2023   TSH 1.040 09/05/2022    Therapeutic Level Labs: No results found for: LITHIUM No results found for: VALPROATE No results found for: CBMZ  Current Medications: Current Outpatient Medications  Medication Sig Dispense Refill   albuterol (PROVENTIL HFA;VENTOLIN HFA) 108 (90 Base) MCG/ACT inhaler Inhale 2 puffs into the lungs every 6 (six) hours as needed for wheezing or shortness of breath.     ARAZLO 0.045 % LOTN Apply topically. (Patient not taking: Reported on 10/19/2022)     atorvastatin  (LIPITOR) 40 MG  tablet Take 1 tablet (40 mg total) by mouth daily. 30 tablet 5   azelastine  (OPTIVAR ) 0.05 % ophthalmic solution 1 drop into affected eye Ophthalmic Twice a day 30 days 6 mL 3   busPIRone  (BUSPAR ) 5 MG tablet Take 1 tablet (5 mg total) by mouth 2 (two) times daily. 180 tablet 0   carbamazepine  (TEGRETOL -XR) 100 MG 12 hr tablet Take 1 tablet (100 mg total) by mouth 2 (two) times daily. (Patient not taking: Reported on 08/13/2023) 60 tablet 2   clonazePAM (KLONOPIN) 0.5 MG tablet Take 0.5 mg by mouth  at bedtime as needed for anxiety.     Drospirenone  (SLYND ) 4 MG TABS Take 1 tablet (4 mg total) by mouth daily. 84 tablet 3   Eflornithine HCl 13.9 % cream Apply a thin layer up to twice a day, at least 8 hours apart 45 g 6   EPINEPHrine  0.3 mg/0.3 mL IJ SOAJ injection as directed Injection prn 30 days 2 each 1   EPSOLAY 5 % cream Apply 1 Application topically every morning.     fluticasone  (FLONASE ) 50 MCG/ACT nasal spray 1 spray each nostril Nasally Once a day 30 days 16 g 3   gabapentin  (NEURONTIN ) 100 MG capsule Take 1 capsule (100 mg total) by mouth 2 (two) times daily for 7 days, THEN 2 capsules (200 mg total) 2 (two) times daily and continue this dose. 120 capsule 3   levocetirizine (XYZAL ) 5 MG tablet Take 1 tablet (5 mg total) by mouth daily. 30 tablet 3   levothyroxine  (SYNTHROID ) 75 MCG tablet Take 1 tablet (75 mcg total) by mouth daily before breakfast. 30 tablet 11   LORazepam  (ATIVAN ) 1 MG tablet Take 1 tablet (1 mg total) by mouth daily as needed for anxiety. (Patient not taking: Reported on 08/13/2023) 20 tablet 0   metformin (FORTAMET) 500 MG (OSM) 24 hr tablet Take 500 mg by mouth daily with breakfast.     metFORMIN (GLUMETZA) 500 MG (MOD) 24 hr tablet Take 500 mg by mouth daily with breakfast.     mometasone  (ELOCON ) 0.1 % cream 1 application Externally Twice a day 30 days (Patient not taking: Reported on 10/19/2022) 90 g 1   montelukast (SINGULAIR) 10 MG tablet Take 10 mg by mouth as  needed.     prazosin  (MINIPRESS ) 1 MG capsule Take 1 capsule (1 mg total) by mouth at bedtime for 3 days. 3 capsule 0   prazosin  (MINIPRESS ) 1 MG capsule Take 1 capsule (1 mg total) by mouth at bedtime. Total of 3 mg at night. Take along with 2 mg cap 30 capsule 1   prazosin  (MINIPRESS ) 2 MG capsule Take 1 capsule (2 mg total) by mouth at bedtime. 30 capsule 1   spironolactone  (ALDACTONE ) 100 MG tablet TAKE 2 TABLETS BY MOUTH DAILY (Patient not taking: Reported on 10/19/2022) 180 tablet 3   traZODone  (DESYREL ) 100 MG tablet Take 2 tablets (200 mg total) by mouth at bedtime as needed for sleep. 180 tablet 0   Venlafaxine  HCl 225 MG TB24 Take 1 tablet (225 mg total) by mouth daily. 90 tablet 0   venlafaxine  XR (EFFEXOR -XR) 150 MG 24 hr capsule Take 1 capsule (150 mg total) by mouth daily with breakfast. Total of 225 mg daily. Take along with 75 mg cap 90 capsule 0   venlafaxine  XR (EFFEXOR -XR) 75 MG 24 hr capsule Take 1 capsule (75 mg total) by mouth daily with breakfast. Total of 225 mg daily. Take along with 150 mg cap 90 capsule 0   Current Facility-Administered Medications  Medication Dose Route Frequency Provider Last Rate Last Admin   cyanocobalamin  ((VITAMIN B-12)) injection 1,000 mcg  1,000 mcg Intramuscular Once Thompson, Annie, CNM         Musculoskeletal: Strength & Muscle Tone: N/A Gait & Station: N/A Patient leans: N/A  Psychiatric Specialty Exam: Review of Systems  There were no vitals taken for this visit.There is no height or weight on file to calculate BMI.  General Appearance: {Appearance:22683}  Eye Contact:  {BHH EYE CONTACT:22684}  Speech:  Clear and Coherent  Volume:  Normal  Mood:  {BHH MOOD:22306}  Affect:  {Affect (PAA):22687}  Thought Process:  Coherent  Orientation:  Full (Time, Place, and Person)  Thought Content: Logical   Suicidal Thoughts:  {ST/HT (PAA):22692}  Homicidal Thoughts:  {ST/HT (PAA):22692}  Memory:  Immediate;   Good  Judgement:  {Judgement  (PAA):22694}  Insight:  {Insight (PAA):22695}  Psychomotor Activity:  Normal  Concentration:  Concentration: Good and Attention Span: Good  Recall:  Good  Fund of Knowledge: Good  Language: Good  Akathisia:  No  Handed:  Right  AIMS (if indicated): not done  Assets:  Communication Skills Desire for Improvement  ADL's:  Intact  Cognition: WNL  Sleep:  {BHH GOOD/FAIR/POOR:22877}   Screenings: GAD-7    Flowsheet Row Office Visit from 03/06/2023 in [facility] Office Visit from 06/16/2020 in [facility] Office Visit from 04/28/2020 in [facility]  Total GAD-7 Score 0 7 8   PHQ2-9    Flowsheet Row Office Visit from 03/06/2023 in [facility] Office Visit from 11/09/2022 in [facility] Office Visit from 10/20/2021 in [facility] Video Visit from 07/27/2021 in [facility] Video Visit from 03/30/2021 in [facility]  PHQ-2 Total Score 0 0 0 1 0  PHQ-9 Total Score -- -- 1 -- --   Flowsheet Row ED from 07/08/2023 in [facility] at [facility] ED from 05/04/2023 in [facility] Emergency Department at [facility] Video Visit from 03/30/2021 in [facility]  C-SSRS RISK CATEGORY No Risk No Risk No Risk     Assessment and Plan:  Paula [name] is a 43 y.o. year old female with a history of PTSD, depression, anxiety, GERD, hypothyroidism, PCOS, who presents for follow up appointment for below.    1. PTSD (post-traumatic stress disorder) 2. MDD (major depressive disorder), recurrent episode, moderate (HCC) [F33.1] 3. GAD (generalized anxiety disorder) [F41.1] Acute stressors include: conflict with her daughter, occasional marital conflict, recent overdose of her mother,  concern about her grandfather with memory loss  Other stressors include: childhood trauma, neck pain  She reports overall improvement in emotional lability, depressive symptoms since starting BuSpar , although she continues to have PTSD symptoms.  She recently reconnected with her paternal aunt and was able to visit her grandparents, who had played a parental role in her life.  Will continue current medication regimen at this time.  Will continue venlafaxine  to target PTSD, depression and anxiety.  Will continue prazosin  for hyperarousal symptoms related to PTSD.  Will continue BuSpar  for anxiety.  She will greatly benefit from CBT/DBT; she was advised to look up psychology today especially given she is interested in Christian-based therapy.    4. Attention deficit hyperactivity disorder (ADHD), unspecified ADHD type [F90.9] - neuropsych testing on 10/26/2022. Dx- ADHD, combined presentation, severe. UDS negative  10/2022   - focalin  was discontinued by Dr. Brendan due to concern of muscle contraction and spasm in Feb She continues to struggle with concentration especially since being off Focalin .  It was discussed with the patient that this provider will not be able to restart this medication or any other stimulant due to medical concerns regarding potential adverse reactions. She has an upcoming appointment at [facility] neurology for evaluation of seizure.  She agrees to pursue this.  Given she reports limited benefit from Ritalin , we will discontinue this  at this time.    # Alcohol use Improving.  She denies any recent episode of binge drinking. She denies any craving for alcohol. Will continue to assess.    Plan - she would like all meds to be sent to [facility] Continue venlafaxine  225 mg daily Continue buspar  5 mg twice a day Continue prazosin  2 mg at night  Continue Trazodone  12.5 mg at night as needed for insomnia- drowsiness from higher dose Hold ritalin  (was on 10 mg) Next appointment:  10/10 at  9 30, video   Past trials of medication: sertraline , fluoxetine  (nightmares), L-theanine , Ambien , Trazodone    The patient demonstrates the following risk factors for suicide: Chronic risk factors for suicide include: psychiatric disorder of depression, anxiety. Acute risk factors for suicide include: N/A. Protective factors for this patient include: positive social support, coping skills and hope for the future. Considering these factors, the overall suicide risk at this point appears to be low. Patient is appropriate for outpatient follow up.      Collaboration of Care: Collaboration of Care: {BH OP Collaboration of Care:21014065}  Patient/Guardian was advised Release of Information must be obtained prior to any record release in order to collaborate their care with an outside provider. Patient/Guardian was advised if they have not already done so to contact the registration department to sign all necessary forms in order for us  to release information regarding their care.   Consent: Patient/Guardian gives verbal consent for treatment and assignment of benefits for services provided during this visit. Patient/Guardian expressed understanding and agreed to proceed.    Katheren Sleet, MD 03/01/2024, 5:04 PM

## 2024-03-07 ENCOUNTER — Telehealth: Admitting: Psychiatry

## 2024-03-09 NOTE — Progress Notes (Unsigned)
 Virtual Visit via Video Note  I connected with Paula [name] on 03/12/24 at  1:00 PM EDT by a video enabled telemedicine application and verified that I am speaking with the correct person using two identifiers.  Location: Patient: home Provider: home office Persons participated in the visit- patient, provider    I discussed the limitations of evaluation and management by telemedicine and the availability of in person appointments. The patient expressed understanding and agreed to proceed.    I discussed the assessment and treatment plan with the patient. The patient was provided an opportunity to ask questions and all were answered. The patient agreed with the plan and demonstrated an understanding of the instructions.   The patient was advised to call back or seek an in-person evaluation if the symptoms worsen or if the condition fails to improve as anticipated.    Katheren Sleet, MD    [facility] MD/PA/NP OP Progress Note  03/12/2024 2:39 PM Paula [name]  MRN:  [id number]  Chief Complaint:  Chief Complaint  Patient presents with   Follow-up   HPI:  This is a follow-up appointment for PTSD, depression, anxiety and ADHD.  She states that her daughter is still in the counseling. Paula [name] gets to talk with this therapist as well, and finds the skills to be helpful.  She has been encouraging her daughter with positive reinforcement/award.  She thinks the relationship has been getting better.  She struggles with joint pain and she was informed by another provider that she might have untreated Lyme disease.  She believes her brain fog has been getting better since being under the treatment.  She has been attending activities at the church.  She believes depression and anxiety is better.  She finds it very helpful to sit outside in the sun.  She has good appetite.  She continues to struggle with focus.  She tends to get sidetracked very easily and is forgetful.  She has bought a new  mattress and trying to get used to it.  She denies SI, hallucinations.  She agrees with the plans as outlined below.   Substance use   Tobacco Alcohol Other substances/supplements  Current   Once in two weeks Previously Ashwagandha, Primal queen  Past   History of binge drinking denies  Past Treatment             Visit Diagnosis:    ICD-10-CM   1. PTSD (post-traumatic stress disorder)  F43.10     2. MDD (major depressive disorder), recurrent episode, mild  F33.0     3. GAD (generalized anxiety disorder) [F41.1]  F41.1     4. Attention deficit hyperactivity disorder (ADHD), unspecified ADHD type [F90.9]  F90.9       Past Psychiatric History: Please see initial evaluation for full details. I have reviewed the history. No updates at this time.     Past Medical History:  Past Medical History:  Diagnosis Date   ADHD    Anemia    Asthma    exercise induced   BULIMIA 01/17/2008   Annotation: AGE 56 Qualifier: Diagnosis of  By: Judyth LPN, Wanda     Complication of anesthesia    vomited during last c/s   CPD (cephalo-pelvic disproportion) 12/22/2014   Cystic fibrosis carrier in second trimester, antepartum 05/29/2014   Depression    Eczema    GERD (gastroesophageal reflux disease)    chronic   Headache    h/o migraines   Hypothyroidism    Nausea  Oligomenorrhea 12/22/2014   PCOS (polycystic ovarian syndrome)    PONV (postoperative nausea and vomiting)    Rhinitis, allergic    Trigeminal neuralgia of right side of face     Past Surgical History:  Procedure Laterality Date   CESAREAN SECTION  04/19/2013   LTCS; CPD   CESAREAN SECTION N/A 10/27/2015   Procedure: REPEAT CESAREAN SECTION;  Surgeon: Gladis DELENA Dollar, MD;  Location: [facility];  Service: Obstetrics;  Laterality: N/A;   ENDOSCOPIC TURBINATE REDUCTION Bilateral 07/26/2017   Procedure: ENDOSCOPIC INFERIOR  TURBINATE REDUCTION;  Surgeon: Edda Mt, MD;  Location: [facility];  Service: ENT;   Laterality: Bilateral;   FRONTAL SINUS EXPLORATION Bilateral 07/26/2017   Procedure: FRONTAL SINUS EXPLORATION;  Surgeon: Edda Mt, MD;  Location: [facility];  Service: ENT;  Laterality: Bilateral;   IMAGE GUIDED SINUS SURGERY Bilateral 07/26/2017   Procedure: IMAGE GUIDED SINUS SURGERY;  Surgeon: Edda Mt, MD;  Location: [facility];  Service: ENT;  Laterality: Bilateral;  GAVE DISK TO CECE 2-12   SEPTOPLASTY WITH ETHMOIDECTOMY, AND MAXILLARY ANTROSTOMY Bilateral 07/26/2017   Procedure: SEPTOPLASTY WITH TOTAL  ETHMOIDECTOMY, AND MAXILLARY ANTROSTOMYWITH REMOVAL OF TISSUE,;  Surgeon: Juengel, Paul, MD;  Location: [facility];  Service: ENT;  Laterality: Bilateral;   TONSILLECTOMY     WISDOM TOOTH EXTRACTION      Family Psychiatric History: Please see initial evaluation for full details. I have reviewed the history. No updates at this time.     Family History:  Family History  Problem Relation Age of Onset   Endometriosis Mother    Bipolar disorder Mother    Alcohol abuse Mother    Heart disease Maternal Grandmother        valvular problem   Depression Maternal Grandmother    Breast cancer Neg Hx    Ovarian cancer Neg Hx    Colon cancer Neg Hx     Social History:  Social History   Socioeconomic History   Marital status: Married    Spouse name: Not on file   Number of children: Not on file   Years of education: Not on file   Highest education level: Not on file  Occupational History   Occupation: ED tech    Employer: [employer]  Tobacco Use   Smoking status: Never   Smokeless tobacco: Never  Vaping Use   Vaping status: Never Used  Substance and Sexual Activity   Alcohol use: Yes    Alcohol/week: 2.0 standard drinks of alcohol    Types: 2 Standard drinks or equivalent per week    Comment: Social   Drug use: No   Sexual activity: Yes    Partners: Male    Birth control/protection: Pill  Other Topics Concern   Not on file  Social History  Narrative   Not on file   Social Drivers of Health   Financial Resource Strain: Low Risk  (12/21/2023)   Received from [facility]   Overall Financial Resource Strain (CARDIA)    Difficulty of Paying Living Expenses: Not hard at all  Food Insecurity: No Food Insecurity (12/21/2023)   Received from [facility]   Hunger Vital Sign    Within the past 12 months, you worried that your food would run out before you got the money to buy more.: Never true    Within the past 12 months, the food you bought just didn't last and you didn't have money to get more.: Never true  Transportation Needs: No Transportation Needs (12/21/2023)   Received from [facility] - Transportation    In the past 12 months, has lack of transportation kept you from medical appointments or from getting medications?: No    Lack of Transportation (Non-Medical): No  Physical Activity: Not on file  Stress: Not on file  Social Connections: Not on file    Allergies:  Allergies  Allergen Reactions   Iodine Anaphylaxis    Contrast    Shellfish Allergy  Hives   Contrast Media [Iodinated Contrast Media]    Flagyl  [Metronidazole ]     Metabolic Disorder Labs: Lab Results  Component Value Date   HGBA1C 5.1 01/25/2023   Lab Results  Component Value Date   PROLACTIN 35.8 (H) 03/14/2016   PROLACTIN 43.4 12/27/2007   Lab Results  Component Value Date   CHOL 295 (H) 01/25/2023   TRIG 74 01/25/2023   HDL 55 01/25/2023   CHOLHDL 5.4 (H) 01/25/2023   VLDL 17 09/25/2011   LDLCALC 228 (H) 01/25/2023   LDLCALC 214 (H) 08/10/2021   Lab Results  Component Value Date   TSH 1.850 01/25/2023   TSH 1.040 09/05/2022    Therapeutic Level Labs: No results found for: LITHIUM No results found for: VALPROATE No results found for: CBMZ  Current Medications: Current Outpatient Medications  Medication Sig Dispense Refill   albuterol (PROVENTIL HFA;VENTOLIN  HFA) 108 (90 Base) MCG/ACT inhaler Inhale 2 puffs into the lungs every 6 (six) hours as needed for wheezing or shortness of breath.     ARAZLO 0.045 % LOTN Apply topically. (Patient not taking: Reported on 10/19/2022)     atorvastatin  (LIPITOR) 40 MG tablet Take 1 tablet (40 mg total) by mouth daily. 30 tablet 5   azelastine  (OPTIVAR ) 0.05 % ophthalmic solution 1 drop into affected eye Ophthalmic Twice a day 30 days 6 mL 3   [START ON 05/05/2024] busPIRone  (BUSPAR ) 5 MG tablet Take 1 tablet (5 mg total) by mouth 2 (two) times daily. 180 tablet 0   carbamazepine  (TEGRETOL -XR) 100 MG 12 hr tablet Take 1 tablet (100 mg total) by mouth 2 (two) times daily. (Patient not taking: Reported on 08/13/2023) 60 tablet 2   clonazePAM (KLONOPIN) 0.5 MG tablet Take 0.5 mg by mouth at bedtime as needed for anxiety.     Drospirenone  (SLYND ) 4 MG TABS Take 1 tablet (4 mg total) by mouth daily. 84 tablet 3   Eflornithine HCl 13.9 % cream Apply a thin layer up to twice a day, at least 8 hours apart 45 g 6   EPINEPHrine  0.3 mg/0.3 mL IJ SOAJ injection as directed Injection prn 30 days 2 each 1   EPSOLAY 5 % cream Apply 1 Application topically every morning.     fluticasone  (FLONASE ) 50 MCG/ACT nasal spray 1 spray each nostril Nasally Once a day 30 days 16 g 3   gabapentin  (NEURONTIN ) 100 MG capsule Take 1 capsule (100 mg total) by mouth 2 (two) times daily for 7 days, THEN 2 capsules (200 mg total) 2 (two) times daily and continue this dose. 120 capsule 3   levocetirizine (XYZAL ) 5 MG tablet Take 1 tablet (5 mg total) by mouth daily. 30 tablet 3   levothyroxine  (SYNTHROID ) 75 MCG tablet Take 1 tablet (75 mcg total) by mouth daily before breakfast. 30 tablet 11   LORazepam  (ATIVAN ) 1 MG tablet Take 1 tablet (1 mg total) by mouth daily as needed for anxiety. (Patient not taking: Reported on 08/13/2023)  20 tablet 0   metformin (FORTAMET) 500 MG (OSM) 24 hr tablet Take 500 mg by mouth daily with breakfast.     metFORMIN  (GLUMETZA) 500 MG (MOD) 24 hr tablet Take 500 mg by mouth daily with breakfast.     mometasone  (ELOCON ) 0.1 % cream 1 application Externally Twice a day 30 days (Patient not taking: Reported on 10/19/2022) 90 g 1   montelukast (SINGULAIR) 10 MG tablet Take 10 mg by mouth as needed.     spironolactone  (ALDACTONE ) 100 MG tablet TAKE 2 TABLETS BY MOUTH DAILY (Patient not taking: Reported on 10/19/2022) 180 tablet 3   traZODone  (DESYREL ) 100 MG tablet Take 2 tablets (200 mg total) by mouth at bedtime as needed for sleep. 180 tablet 0   Venlafaxine  HCl 225 MG TB24 Take 1 tablet (225 mg total) by mouth daily. 90 tablet 0   [START ON 04/04/2024] venlafaxine  XR (EFFEXOR -XR) 150 MG 24 hr capsule Take 1 capsule (150 mg total) by mouth daily with breakfast. Total of 225 mg daily. Take along with 75 mg cap 90 capsule 0   [START ON 04/10/2024] venlafaxine  XR (EFFEXOR -XR) 75 MG 24 hr capsule Take 1 capsule (75 mg total) by mouth daily with breakfast. Total of 225 mg daily. Take along with 150 mg cap 90 capsule 0   Current Facility-Administered Medications  Medication Dose Route Frequency Provider Last Rate Last Admin   cyanocobalamin  ((VITAMIN B-12)) injection 1,000 mcg  1,000 mcg Intramuscular Once Thompson, Annie, CNM         Musculoskeletal: Strength & Muscle Tone: N/A Gait & Station: N/A Patient leans: N/A  Psychiatric Specialty Exam: Review of Systems  Psychiatric/Behavioral:  Positive for decreased concentration. Negative for agitation, behavioral problems, confusion, dysphoric mood, hallucinations, self-injury, sleep disturbance and suicidal ideas. The patient is not nervous/anxious and is not hyperactive.   All other systems reviewed and are negative.   There were no vitals taken for this visit.There is no height or weight on file to calculate BMI.  General Appearance: Well Groomed  Eye Contact:  Good  Speech:  Clear and Coherent  Volume:  Normal  Mood:  good  Affect:  Appropriate, Congruent,  and calm  Thought Process:  Coherent  Orientation:  Full (Time, Place, and Person)  Thought Content: Logical   Suicidal Thoughts:  No  Homicidal Thoughts:  No  Memory:  Immediate;   Good  Judgement:  Good  Insight:  Good  Psychomotor Activity:  Normal  Concentration:  Concentration: Good and Attention Span: Good  Recall:  Good  Fund of Knowledge: Good  Language: Good  Akathisia:  No  Handed:  Right  AIMS (if indicated): not done  Assets:  Communication Skills Desire for Improvement  ADL's:  Intact  Cognition: WNL  Sleep:  Fair   Screenings: GAD-7    Garment/textile technologist Visit from 03/06/2023 in [facility] Office Visit from 06/16/2020 in [facility] Office Visit from 04/28/2020 in [facility]  Total GAD-7 Score 0 7 8   PHQ2-9    Flowsheet Row Office Visit from 03/06/2023 in [facility] Office Visit from 11/09/2022 in [facility] Office Visit from 10/20/2021 in [facility] Video Visit from 07/27/2021 in [facility] Video Visit from 03/30/2021 in [facility]  PHQ-2 Total Score 0 0 0 1 0  PHQ-9 Total Score -- -- 1 -- --  Flowsheet Row ED from 07/08/2023 in [facility] Department at [facility] ED from 05/04/2023 in [facility] Emergency Department at [facility] Video Visit from 03/30/2021 in [facility]  C-SSRS RISK CATEGORY No Risk No Risk No Risk     Assessment and Plan:  Paula [name] is a 44 y.o. year old female with a history of PTSD, depression, anxiety, GERD, hypothyroidism, PCOS, who presents for follow up appointment for below.   1. PTSD (post-traumatic stress disorder) 2. MDD (major depressive disorder), recurrent episode, mild (HCC) [F33.1] 3. GAD  (generalized anxiety disorder) [F41.1] Acute stressors include: conflict with her daughter, occasional marital conflict, recent overdose of her mother, concern about her grandfather with memory loss  Other stressors include: childhood trauma, neck pain  She reports overall improvement in emotional lability, depressive symptoms and anxiety, and has good benefit from BuSpar .  Noted that she has been taking lower dose of venlafaxine  although she denies any adverse reaction.  She is willing to restart the original dose to avoid relapsing her mood symptoms.  Will continue BuSpar  for anxiety.  Noted that she self discontinued prazosin , and denies much difference since then.  Will continue to hold this medication at this time. She will greatly benefit from CBT/DBT; she was advised to look up psychology today especially given she is interested in Christian-based therapy.   # Insomnia Overall stable. Will continue current dose of trazodone  as needed for insomnia.  4. Attention deficit hyperactivity disorder (ADHD), unspecified ADHD type [F90.9] - neuropsych testing on 10/26/2022. Dx- ADHD, combined presentation, severe. UDS negative  10/2022   - Focalin  was discontinued by Dr. Brendan due to concern of muscle contraction and spasm in Feb  She reports difficulty in attention, and is forgetful since being off Focalin .  She has an upcoming appointment with neurologist at [facility] for evaluation of seizure.  Will hold starting stimulant at this time to reduce the possible risk of muscle contraction/spasm, which she experienced in the past.    # Alcohol use Improving.  She denies any recent episode of binge drinking. She denies any craving for alcohol. Will continue to assess.    Plan - she would like all meds to be sent to [facility] Restart venlafaxine  225 mg daily (she has been taking 150 mg) Continue buspar  5 mg twice a day Hold prazosin  (was on 2 mg) Continue Trazodone  12.5 mg at night as needed for insomnia-  drowsiness from higher dose Next appointment:  12/17 at  1 40, video  Past trials of medication: sertraline , fluoxetine  (nightmares), prazosin , L-theanine , Ambien , Trazodone    The patient demonstrates the following risk factors for suicide: Chronic risk factors for suicide include: psychiatric disorder of depression, anxiety. Acute risk factors for suicide include: N/A. Protective factors for this patient include: positive social support, coping skills and hope for the future. Considering these factors, the overall suicide risk at this point appears to be low. Patient is appropriate for outpatient follow up.      Collaboration of Care: Collaboration of Care: Other reviewed notes in Epic  Patient/Guardian was advised Release of Information must be obtained prior to any record release in order to collaborate their care with an outside provider. Patient/Guardian was advised if they have not already done so to contact the registration department to sign all necessary forms in order for us  to release information regarding their care.   Consent: Patient/Guardian gives verbal consent for treatment and assignment of benefits for services provided  during this visit. Patient/Guardian expressed understanding and agreed to proceed.    Katheren Sleet, MD 03/12/2024, 2:39 PM

## 2024-03-12 ENCOUNTER — Telehealth (INDEPENDENT_AMBULATORY_CARE_PROVIDER_SITE_OTHER): Admitting: Psychiatry

## 2024-03-12 ENCOUNTER — Encounter: Payer: Self-pay | Admitting: Psychiatry

## 2024-03-12 DIAGNOSIS — F330 Major depressive disorder, recurrent, mild: Secondary | ICD-10-CM

## 2024-03-12 DIAGNOSIS — F411 Generalized anxiety disorder: Secondary | ICD-10-CM

## 2024-03-12 DIAGNOSIS — F909 Attention-deficit hyperactivity disorder, unspecified type: Secondary | ICD-10-CM | POA: Diagnosis not present

## 2024-03-12 DIAGNOSIS — F4310 Post-traumatic stress disorder, unspecified: Secondary | ICD-10-CM

## 2024-03-12 MED ORDER — VENLAFAXINE HCL ER 150 MG PO CP24: 150.0000 mg | ORAL_CAPSULE | Freq: Every day | ORAL | 0 refills | Status: AC

## 2024-03-12 MED ORDER — BUSPIRONE HCL 5 MG PO TABS: 5.0000 mg | ORAL_TABLET | Freq: Two times a day (BID) | ORAL | 0 refills | Status: AC

## 2024-03-12 MED ORDER — VENLAFAXINE HCL ER 75 MG PO CP24: 75.0000 mg | ORAL_CAPSULE | Freq: Every day | ORAL | 0 refills | Status: AC

## 2024-03-12 NOTE — Patient Instructions (Signed)
 Restart venlafaxine  225 mg daily Continue buspar  5 mg twice a day Hold prazosin   Continue Trazodone  12.5 mg at night as needed for insomnia  Next appointment:  12/17 at  1 40

## 2024-04-16 ENCOUNTER — Ambulatory Visit: Admit: 2024-04-16 | Discharge: 2024-04-16 | Payer: MEDICAID | Attending: Women's Health

## 2024-04-16 VITALS — BP 118/70 | HR 67 | Wt 187.4 lb

## 2024-04-16 DIAGNOSIS — Z23 Encounter for immunization: Principal | ICD-10-CM

## 2024-04-16 NOTE — Progress Notes (Signed)
"  Patient was given Gardasil in the Right Deltoid.  NDC# 9993-5878-98  LOT# [id number]  Exp date- 07/20/2025  Patient's last injection was 09/14/2023.  Patient's last annual exam was on 06/04/2023.  Patient tolerated well without difficulty.   "

## 2024-05-10 NOTE — Progress Notes (Unsigned)
 Virtual Visit via Video Note  I connected with Paula [name] on 05/14/2024 at  1:40 PM EST by a video enabled telemedicine application and verified that I am speaking with the correct person using two identifiers.  Location: Patient: home Provider: home office Persons participated in the visit- patient, provider    I discussed the limitations of evaluation and management by telemedicine and the availability of in person appointments. The patient expressed understanding and agreed to proceed.    I discussed the assessment and treatment plan with the patient. The patient was provided an opportunity to ask questions and all were answered. The patient agreed with the plan and demonstrated an understanding of the instructions.   The patient was advised to call back or seek an in-person evaluation if the symptoms worsen or if the condition fails to improve as anticipated.   Katheren Sleet, MD    [facility] MD/PA/NP OP Progress Note  05/14/2024 5:45 PM Paula [name]  MRN:  [id number]  Chief Complaint:  Chief Complaint  Patient presents with   Follow-up   HPI:  This is a follow-up appointment for PTSD, depression and anxiety.  She states that she had a good thanksgiving.  She was able to see her aunt, cousin, grandmother, brother and father who has dementia.  Her daughter has been able to see a therapist.  It has been helping a lot and she reports better relationship with her.  Her therapist is offering for family therapy, and she is hoping to do this.  She will be seen by neurologist for second opinion about her possible seizure.  She wishes to be softer, feminine and delicate.  She feels she always has to be so harsh.  She has been sleeping fair.  Although she has not noticed much difference since taking higher dose of venlafaxine , she denies any concern.  She feels like her mood is okay and in good spot.  She enjoyed working on [employer] trees in a few days.  She will be doing choirs  with her children. She sleeps fair. She denies nightmares, flashback.  She denies SI, hallucinations.  She continues to have some brain fog.  She agrees with the plans as outlined below.   Substance use   Tobacco Alcohol Other substances/supplements  Current   Once in two weeks Previously Ashwagandha, Primal queen  Past   History of binge drinking denies  Past Treatment            House hold:  Husband, 2 daughters Marital status: married Number of children: 2 ([name], age 24, Enos) She was [place]. Her parents were never married. She had estranged relationship with her biological father since she was a child.  Visit Diagnosis:    ICD-10-CM   1. PTSD (post-traumatic stress disorder)  F43.10     2. MDD (major depressive disorder), recurrent, in partial remission  F33.41     3. GAD (generalized anxiety disorder) [F41.1]  F41.1     4. Insomnia, unspecified type  G47.00       Past Psychiatric History: Please see initial evaluation for full details. I have reviewed the history. No updates at this time.     Past Medical History:  Past Medical History:  Diagnosis Date   ADHD    Anemia    Asthma    exercise induced   BULIMIA 01/17/2008   Annotation: AGE 36 Qualifier: Diagnosis of  By: Judyth LPN, Wanda     Complication of anesthesia  vomited during last c/s   CPD (cephalo-pelvic disproportion) 12/22/2014   Cystic fibrosis carrier in second trimester, antepartum 05/29/2014   Depression    Eczema    GERD (gastroesophageal reflux disease)    chronic   Headache    h/o migraines   Hypothyroidism    Nausea    Oligomenorrhea 12/22/2014   PCOS (polycystic ovarian syndrome)    PONV (postoperative nausea and vomiting)    Rhinitis, allergic    Trigeminal neuralgia of right side of face     Past Surgical History:  Procedure Laterality Date   CESAREAN SECTION  04/19/2013   LTCS; CPD   CESAREAN SECTION N/A 10/27/2015   Procedure: REPEAT CESAREAN SECTION;  Surgeon:  Gladis DELENA Dollar, MD;  Location: [facility];  Service: Obstetrics;  Laterality: N/A;   ENDOSCOPIC TURBINATE REDUCTION Bilateral 07/26/2017   Procedure: ENDOSCOPIC INFERIOR  TURBINATE REDUCTION;  Surgeon: Edda Mt, MD;  Location: [facility];  Service: ENT;  Laterality: Bilateral;   FRONTAL SINUS EXPLORATION Bilateral 07/26/2017   Procedure: FRONTAL SINUS EXPLORATION;  Surgeon: Edda Mt, MD;  Location: [facility];  Service: ENT;  Laterality: Bilateral;   IMAGE GUIDED SINUS SURGERY Bilateral 07/26/2017   Procedure: IMAGE GUIDED SINUS SURGERY;  Surgeon: Edda Mt, MD;  Location: [facility];  Service: ENT;  Laterality: Bilateral;  GAVE DISK TO CECE 2-12   SEPTOPLASTY WITH ETHMOIDECTOMY, AND MAXILLARY ANTROSTOMY Bilateral 07/26/2017   Procedure: SEPTOPLASTY WITH TOTAL  ETHMOIDECTOMY, AND MAXILLARY ANTROSTOMYWITH REMOVAL OF TISSUE,;  Surgeon: Juengel, Paul, MD;  Location: [facility];  Service: ENT;  Laterality: Bilateral;   TONSILLECTOMY     WISDOM TOOTH EXTRACTION      Family Psychiatric History: Please see initial evaluation for full details. I have reviewed the history. No updates at this time.     Family History:  Family History  Problem Relation Age of Onset   Endometriosis Mother    Bipolar disorder Mother    Alcohol abuse Mother    Heart disease Maternal Grandmother        valvular problem   Depression Maternal Grandmother    Breast cancer Neg Hx    Ovarian cancer Neg Hx    Colon cancer Neg Hx     Social History:  Social History   Socioeconomic History   Marital status: Married    Spouse name: Not on file   Number of children: Not on file   Years of education: Not on file   Highest education level: Not on file  Occupational History   Occupation: ED tech    Employer: [employer]  Tobacco Use   Smoking status: Never   Smokeless tobacco: Never  Vaping Use   Vaping status: Never Used  Substance and Sexual Activity   Alcohol use: Yes     Alcohol/week: 2.0 standard drinks of alcohol    Types: 2 Standard drinks or equivalent per week    Comment: Social   Drug use: No   Sexual activity: Yes    Partners: Male    Birth control/protection: Pill  Other Topics Concern   Not on file  Social History Narrative   Not on file   Social Drivers of Health   Tobacco Use: Low Risk (05/14/2024)   Patient History    Smoking Tobacco Use: Never    Smokeless Tobacco Use: Never    Passive Exposure: Not on file  Financial Resource Strain: Low Risk  (12/21/2023)   Received from [facility]   Overall  Financial Resource Strain (CARDIA)    Difficulty of Paying Living Expenses: Not hard at all  Food Insecurity: No Food Insecurity (12/21/2023)   Received from [facility]   Epic    Within the past 12 months, you worried that your food would run out before you got the money to buy more.: Never true    Within the past 12 months, the food you bought just didn't last and you didn't have money to get more.: Never true  Transportation Needs: No Transportation Needs (12/21/2023)   Received from [facility] - Transportation    In the past 12 months, has lack of transportation kept you from medical appointments or from getting medications?: No    Lack of Transportation (Non-Medical): No  Physical Activity: Not on file  Stress: Not on file  Social Connections: Not on file  Depression (PHQ2-9): Low Risk (03/06/2023)   Depression (PHQ2-9)    PHQ-2 Score: 0  Alcohol Screen: Not on file  Housing: Low Risk  (12/21/2023)   Received from [facility]   Epic    In the last 12 months, was there a time when you were not able to pay the mortgage or rent on time?: No    In the past 12 months, how many times have you moved where you were living?: 0    At any time in the past 12 months, were you homeless or living in a shelter (including now)?: No  Utilities: Not At Risk  (12/21/2023)   Received from [facility]   Epic    In the past 12 months has the electric, gas, oil, or water company threatened to shut off services in your home?: No  Health Literacy: Not on file    Allergies: Allergies[1]  Metabolic Disorder Labs: Lab Results  Component Value Date   HGBA1C 5.1 01/25/2023   Lab Results  Component Value Date   PROLACTIN 35.8 (H) 03/14/2016   PROLACTIN 43.4 12/27/2007   Lab Results  Component Value Date   CHOL 295 (H) 01/25/2023   TRIG 74 01/25/2023   HDL 55 01/25/2023   CHOLHDL 5.4 (H) 01/25/2023   VLDL 17 09/25/2011   LDLCALC 228 (H) 01/25/2023   LDLCALC 214 (H) 08/10/2021   Lab Results  Component Value Date   TSH 1.850 01/25/2023   TSH 1.040 09/05/2022    Therapeutic Level Labs: No results found for: LITHIUM No results found for: VALPROATE No results found for: CBMZ  Current Medications: Current Outpatient Medications  Medication Sig Dispense Refill   albuterol (PROVENTIL HFA;VENTOLIN HFA) 108 (90 Base) MCG/ACT inhaler Inhale 2 puffs into the lungs every 6 (six) hours as needed for wheezing or shortness of breath.     ARAZLO 0.045 % LOTN Apply topically. (Patient not taking: Reported on 10/19/2022)     atorvastatin  (LIPITOR) 40 MG tablet Take 1 tablet (40 mg total) by mouth daily. 30 tablet 5   azelastine  (OPTIVAR ) 0.05 % ophthalmic solution 1 drop into affected eye Ophthalmic Twice a day 30 days 6 mL 3   busPIRone  (BUSPAR ) 5 MG tablet Take 1 tablet (5 mg total) by mouth 2 (two) times daily. 180 tablet 0   carbamazepine  (TEGRETOL -XR) 100 MG 12 hr tablet Take 1 tablet (100 mg total) by mouth 2 (two) times daily. (Patient not taking: Reported on 08/13/2023) 60 tablet 2   clonazePAM (KLONOPIN) 0.5 MG tablet Take 0.5 mg by mouth at bedtime as needed for  anxiety.     Drospirenone  (SLYND ) 4 MG TABS Take 1 tablet (4 mg total) by mouth daily. 84 tablet 3   Eflornithine HCl 13.9 % cream Apply a thin layer up to twice  a day, at least 8 hours apart 45 g 6   EPINEPHrine  0.3 mg/0.3 mL IJ SOAJ injection as directed Injection prn 30 days 2 each 1   EPSOLAY 5 % cream Apply 1 Application topically every morning.     fluticasone  (FLONASE ) 50 MCG/ACT nasal spray 1 spray each nostril Nasally Once a day 30 days 16 g 3   gabapentin  (NEURONTIN ) 100 MG capsule Take 1 capsule (100 mg total) by mouth 2 (two) times daily for 7 days, THEN 2 capsules (200 mg total) 2 (two) times daily and continue this dose. 120 capsule 3   levocetirizine (XYZAL ) 5 MG tablet Take 1 tablet (5 mg total) by mouth daily. 30 tablet 3   levothyroxine  (SYNTHROID ) 75 MCG tablet Take 1 tablet (75 mcg total) by mouth daily before breakfast. 30 tablet 11   LORazepam  (ATIVAN ) 1 MG tablet Take 1 tablet (1 mg total) by mouth daily as needed for anxiety. (Patient not taking: Reported on 08/13/2023) 20 tablet 0   metformin (FORTAMET) 500 MG (OSM) 24 hr tablet Take 500 mg by mouth daily with breakfast.     metFORMIN (GLUMETZA) 500 MG (MOD) 24 hr tablet Take 500 mg by mouth daily with breakfast.     mometasone  (ELOCON ) 0.1 % cream 1 application Externally Twice a day 30 days (Patient not taking: Reported on 10/19/2022) 90 g 1   montelukast (SINGULAIR) 10 MG tablet Take 10 mg by mouth as needed.     spironolactone  (ALDACTONE ) 100 MG tablet TAKE 2 TABLETS BY MOUTH DAILY (Patient not taking: Reported on 10/19/2022) 180 tablet 3   traZODone  (DESYREL ) 50 MG tablet Take 0.5-1 tablets (25-50 mg total) by mouth at bedtime as needed for sleep. 90 tablet 0   venlafaxine  XR (EFFEXOR -XR) 150 MG 24 hr capsule Take 1 capsule (150 mg total) by mouth daily with breakfast. Total of 225 mg daily. Take along with 75 mg cap 90 capsule 0   venlafaxine  XR (EFFEXOR -XR) 75 MG 24 hr capsule Take 1 capsule (75 mg total) by mouth daily with breakfast. Total of 225 mg daily. Take along with 150 mg cap 90 capsule 0   Current Facility-Administered Medications  Medication Dose Route Frequency  Provider Last Rate Last Admin   cyanocobalamin  ((VITAMIN B-12)) injection 1,000 mcg  1,000 mcg Intramuscular Once Thompson, Annie, CNM         Musculoskeletal: Strength & Muscle Tone: N/A Gait & Station: N/A Patient leans: N/A  Psychiatric Specialty Exam: Review of Systems  Psychiatric/Behavioral:  Positive for decreased concentration. Negative for agitation, behavioral problems, confusion, dysphoric mood, hallucinations, self-injury, sleep disturbance and suicidal ideas. The patient is not nervous/anxious and is not hyperactive.   All other systems reviewed and are negative.   There were no vitals taken for this visit.There is no height or weight on file to calculate BMI.  General Appearance: Well Groomed  Eye Contact:  Good  Speech:  Clear and Coherent  Volume:  Normal  Mood:  good  Affect:  Appropriate, Congruent, and calm  Thought Process:  Coherent  Orientation:  Full (Time, Place, and Person)  Thought Content: Logical   Suicidal Thoughts:  No  Homicidal Thoughts:  No  Memory:  Immediate;   Good  Judgement:  Good  Insight:  Good  Psychomotor  Activity:  Normal  Concentration:  Concentration: Good and Attention Span: Good  Recall:  Good  Fund of Knowledge: Good  Language: Good  Akathisia:  No  Handed:  Right  AIMS (if indicated): not done  Assets:  Communication Skills Desire for Improvement  ADL's:  Intact  Cognition: WNL  Sleep:  Good   Screenings: GAD-7    Flowsheet Row Office Visit from 03/06/2023 in [facility] Office Visit from 06/16/2020 in [facility] Office Visit from 04/28/2020 in [facility]  Total GAD-7 Score 0 7 8   PHQ2-9    Flowsheet Row Office Visit from 03/06/2023 in [facility] Office Visit from 11/09/2022 in [facility] Office Visit from 10/20/2021 in [facility] Video  Visit from 07/27/2021 in [facility] Video Visit from 03/30/2021 in [facility]  PHQ-2 Total Score 0 0 0 1 0  PHQ-9 Total Score -- -- 1 -- --   Flowsheet Row ED from 07/08/2023 in [facility] at [facility] ED from 05/04/2023 in [facility] Department at [facility] Video Visit from 03/30/2021 in [facility]  C-SSRS RISK CATEGORY No Risk No Risk No Risk     Assessment and Plan:  Paula [name] is a 43 y.o. year old female with a history of PTSD, depression, anxiety, GERD, hypothyroidism, PCOS, who presents for follow up appointment for below.   1. PTSD (post-traumatic stress disorder) 2. MDD (major depressive disorder), recurrent episode, in partial remission 3. GAD (generalized anxiety disorder) [F41.1] She has a family history of her mother with bipolar disorder. She reports childhood trauma.  She has grandfather with dementia.  The exam is notable for calm affect.  Although she reports irritability, there has been overall improvement in depressive symptoms and anxiety.  Upon reviewing her medication, she may have ran out of BuSpar ; she agrees to restart this medication given that she reports good benefit from this medication in the past.  Will continue the current dose of venlafaxine  at this time to target PTSD, depression and anxiety.  She will greatly benefit from CBT /DBT. She is planning start family therapy through the practice where her daughter sees her therapist.   4. Insomnia, unspecified type Overall stable.  Will continue current dose of trazodone  as needed for insomnia.    4. Attention deficit hyperactivity disorder (ADHD), unspecified ADHD type [F90.9] - neuropsych testing on 10/26/2022. Dx- ADHD, combined presentation, severe. UDS negative  10/2022   - Focalin  was discontinued by Dr. Brendan due to concern of muscle  contraction and spasm in Feb  She reports difficulty in attention, and is forgetful since being off Focalin .  She has an upcoming appointment with neurologist at [facility] for evaluation of seizure.  Will hold starting stimulant at this time to reduce the possible risk of muscle contraction/spasm, which she experienced in the past.    # Alcohol use Improving.  She denies any recent episode of binge drinking. She denies any craving for alcohol. Will continue to assess.    Plan - she would like all meds to be sent to [facility] Continue venlafaxine  225 mg daily  Restart Buspar  5 mg twice a day (she may have run out recently) Continue Trazodone  25-50 mg at night as needed for insomnia- drowsiness from higher dose Next appointment:  2/4 at 4 pm,, video  Past trials of medication: sertraline , fluoxetine  (nightmares), prazosin , L-theanine , Ambien , Trazodone    The patient demonstrates the following risk factors for suicide: Chronic risk factors for suicide include: psychiatric disorder of depression, anxiety. Acute risk factors for suicide include: N/A. Protective factors for this patient include: positive social support, coping skills and hope for the future. Considering these factors, the overall suicide risk at this point appears to be low. Patient is appropriate for outpatient follow up.    Collaboration of Care: Collaboration of Care: Other reviewed notes in Epic  Patient/Guardian was advised Release of Information must be obtained prior to any record release in order to collaborate their care with an outside provider. Patient/Guardian was advised if they have not already done so to contact the registration department to sign all necessary forms in order for us  to release information regarding their care.   Consent: Patient/Guardian gives verbal consent for treatment and assignment of benefits for services provided during this visit. Patient/Guardian expressed understanding and agreed to proceed.     Katheren Sleet, MD 05/14/2024, 5:45 PM     [1]  Allergies Allergen Reactions   Iodine Anaphylaxis    Contrast    Shellfish Allergy  Hives   Contrast Media [Iodinated Contrast Media]    Flagyl  [Metronidazole ]

## 2024-05-14 ENCOUNTER — Telehealth: Admitting: Psychiatry

## 2024-05-14 ENCOUNTER — Encounter: Payer: Self-pay | Admitting: Psychiatry

## 2024-05-14 DIAGNOSIS — F1090 Alcohol use, unspecified, uncomplicated: Secondary | ICD-10-CM

## 2024-05-14 DIAGNOSIS — F3341 Major depressive disorder, recurrent, in partial remission: Secondary | ICD-10-CM

## 2024-05-14 DIAGNOSIS — F4310 Post-traumatic stress disorder, unspecified: Secondary | ICD-10-CM | POA: Diagnosis not present

## 2024-05-14 DIAGNOSIS — F909 Attention-deficit hyperactivity disorder, unspecified type: Secondary | ICD-10-CM | POA: Diagnosis not present

## 2024-05-14 DIAGNOSIS — G4700 Insomnia, unspecified: Secondary | ICD-10-CM | POA: Diagnosis not present

## 2024-05-14 DIAGNOSIS — F411 Generalized anxiety disorder: Secondary | ICD-10-CM | POA: Diagnosis not present

## 2024-05-14 MED ORDER — TRAZODONE HCL 50 MG PO TABS: 25.0000 mg | ORAL_TABLET | Freq: Every evening | ORAL | 0 refills | Status: DC | PRN

## 2024-05-14 NOTE — Patient Instructions (Signed)
 Continue venlafaxine  225 mg daily  Restart buspar  5 mg twice a day  Continue Trazodone  25-50 mg at night as needed for insomnia Next appointment:  2/4 at 4 pm

## 2024-06-04 ENCOUNTER — Ambulatory Visit
Admit: 2024-06-04 | Discharge: 2024-06-04 | Payer: MEDICAID | Attending: Student in an Organized Health Care Education/Training Program

## 2024-06-04 ENCOUNTER — Encounter: Payer: MEDICAID | Attending: Student in an Organized Health Care Education/Training Program

## 2024-06-04 VITALS — BP 124/78 | HR 77 | Ht 64.0 in | Wt 188.0 lb

## 2024-06-04 DIAGNOSIS — Z01419 Encounter for gynecological examination (general) (routine) without abnormal findings: Principal | ICD-10-CM

## 2024-06-04 NOTE — Progress Notes (Signed)
 OB/GYN Annual Visit  [facility]     Olivia Castro  06/04/2024                       Primary Care Physician: Gwenn Powers, FNP    CC:   Chief Complaint   Patient presents with    Annual Exam         HPI: Olivia Castro is a 44 y.o. female [id number] here for an annual visit. She has no complaints today.     Patient's last menstrual period was 05/15/2024 (approximate). She has overall regular menstrual cycles. She has 5 day menstrual cycles with the first few days being more heavy and then lightening as the days go on. She is not managing these with any medication and is not interested at this time. 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    Her bowel habits are regular. She denies any bloating.  She denies dysuria. She denies urinary leaking.  She denies vaginal discharge.  She is not sexually active currently. She denies any breast complaints.     Depression Screen: Symptoms of decreased mood absent  Symptoms of anhedonia absent    REVIEW OF SYSTEMS:   Constitutional: negative fever, negative chills, negative weight changes   HEENT: negative visual disturbances, negative headaches, negative dizziness, negative hearing loss  Breast: Negative breast abnormalities, negative breast lumps, negative nipple discharge  Respiratory: negative dyspnea, negative cough, negative SOB  Cardiovascular: negative chest pain,  negative palpitations, negative arrhythmia, negative syncope   Gastrointestinal: negative abdominal pain, negative RUQ pain, negative N/V, negative diarrhea, negative constipation, negative bowel changes, negative heartburn   Genitourinary: negative pelvic pain, negative dysuria, negative hematuria, negative urinary incontinence, negative vaginal discharge, negative vaginal bleeding  Dermatological: negative rash, negative pruritis, negative mole or other skin changes  Hematologic: negative  bruising  Immunologic/Lymphatic: negative recent illness, negative recent sick contact  Musculoskeletal: negative back pain, negative myalgias, negative arthralgias  Neurological:  negative dizziness, negative migraines, negative seizures, negative weakness  Behavior/Psych: negative depression, negative anxiety, negative SI, negative HI    ________________________________________________________________________    GYNECOLOGICAL HISTORY:  Age of Menarche: 78  Age of Menopause: n/a      Sexually Active: not currently sexually active  STD History: denies     Pap History: NILM, HPV neg 06/04/23  Hx Abnormal Pap: denies     Permanent Sterilization: no  Reversible Birth Control: no     HEALTH MAINTENANCE:  Gardasil immunization: completed  Date of Last Mammogram: patient has never had a mammogram, she has no interest in screening until age 69  Date of Last Colonoscopy: n/a    OBSTETRICAL HISTORY:  OB History   Gravida Para Term Preterm AB Living   3 1 1  0 2 1   SAB IAB Ectopic Molar Multiple Live Births   0 0 2 0 0 1      # Outcome Date GA Lbr Len/2nd Weight Sex Type Anes PTL Lv   3 Term 2000    F Vag-Spont   LIV   2 Ectopic            1 Ectopic                PAST MEDICAL HISTORY:   has a past medical history of Hypothyroid.    PAST SURGICAL HISTORY:   has a past surgical history that includes  Ectopic pregnancy surgery.    ALLERGIES:  is allergic to iodine.    MEDICATIONS:  Prior to Admission medications   Medication Sig Start Date End Date Taking? Authorizing Provider   levothyroxine (SYNTHROID) 75 MCG tablet  08/14/22   [provider]       FAMILY HISTORY:  Family History of Breast, Ovarian, Colon or Uterine Cancer: Yes   Paternal aunt with breast cancer in 62s   family history includes Breast Cancer in her paternal aunt; Diabetes in her father and mother; High Blood Pressure in her father and mother; Hypertension in her father and mother; Lung Cancer in her maternal grandmother; Prostate Cancer in her  father.    SOCIAL HISTORY:   reports that she has never smoked. She has never used smokeless tobacco. She reports that she does not drink alcohol and does not use drugs.      VITALS:  Vitals:    06/04/24 0814   BP: 124/78   BP Site: Right Upper Arm   Patient Position: Sitting   BP Cuff Size: Medium Adult   Pulse: 77   Weight: 85.3 kg (188 lb)   Height: 1.626 m (5' 4)                                                                                                                                                                          PHYSICAL EXAM:   General Appearance: Appears healthy.  Alert; in no acute distress.  Pleasant.  Skin: Skin color, texture, turgor normal. No rashes or lesions.  HEENT: normocephalic and atraumatic  Respiratory: no conversational dyspnea  Cardiovascular: normal rate and regular rhythm  Breast:  (Chest): normal appearance, no masses or tenderness, No nipple retraction or dimpling, No nipple discharge or bleeding, No axillary or supraclavicular adenopathy  Abdomen: soft, non-tender, non-distended, no right upper quadrant tenderness, and no CVA tenderness  Pelvic Exam:   Chaperone for Intimate Exam: Chaperone was offered and declined  External genitalia: normal general appearance without lesions  Urinary system: urethral meatus normal, bladder nontender  Vaginal: normal mucosa, scant discharge  Cervix: normal appearance without discharge or lesions, no CMT  Adnexa: nontender, no masses  Uterus: normal single, mobile, nontender  Musculoskeletal: no gross abnormalities  Extremities: non-tender BLE and non-edematous  Psych:  oriented to time, place and person, mood and affect are within normal limits     ASSESSMENT & PLAN:    Olivia Castro is a 44 y.o. female [phone] for annual well woman exam   - Vitals stable  - Gardasil vaccine: completed  - Last pap smear: NILM, HPV neg 06/04/23  - Last mammogram: desires to initiate screening at age 76  - Last colonoscopy: n/a   -  Family Planning:  not sexually active   - Pelvic cultures declined, no concerns   - Family history reviewed   - Recommend continued yearly well woman exams    Patient Active Problem List    Diagnosis Date Noted    History of ectopic pregnancy x2 10/18/2022    Family history of breast cancer 07/12/2020       Return in about 1 year (around 06/04/2025) for well woman.    No Patient Care Coordination Note on file.      Counseling Completed:    Counseled about need for pap smears as per American Society for Colposcopy and Cervical Pathology guidelines.  Counseled about need for mammograms every year, If >23 yo and last mammogram was negative.  Counseled about birth control.  Counseled about STD counseling and prevention.  Counseled about Gardasil counseling for all patients 84-45 yo.  Counseled about Hereditary Breast, Ovarian, Colon and Uterine Cancer screening.  Routine health maintenance per patients PCP discussed.     Diagnosis Orders   1. Well woman exam with routine gynecological exam             Joesph FORBES Banks, DO  [facility]  06/04/2024, 8:45 AM

## 2024-07-02 ENCOUNTER — Encounter: Payer: Self-pay | Admitting: Psychiatry

## 2024-07-02 ENCOUNTER — Other Ambulatory Visit: Payer: Self-pay | Admitting: Psychiatry

## 2024-07-02 ENCOUNTER — Telehealth: Admitting: Psychiatry

## 2024-07-02 DIAGNOSIS — G4700 Insomnia, unspecified: Secondary | ICD-10-CM | POA: Diagnosis not present

## 2024-07-02 DIAGNOSIS — F411 Generalized anxiety disorder: Secondary | ICD-10-CM

## 2024-07-02 DIAGNOSIS — F3341 Major depressive disorder, recurrent, in partial remission: Secondary | ICD-10-CM

## 2024-07-02 DIAGNOSIS — F4310 Post-traumatic stress disorder, unspecified: Secondary | ICD-10-CM

## 2024-07-02 MED ORDER — TRAZODONE HCL 50 MG PO TABS: 25.0000 mg | ORAL_TABLET | Freq: Every evening | ORAL | 0 refills | Status: AC | PRN

## 2024-07-02 NOTE — Patient Instructions (Signed)
 Continue venlafaxine  150 mg  Continue Buspar  5 mg daily  Continue Trazodone  25-50 mg at night as needed for insomnia Next appointment: 4/6 at 10:30

## 2024-09-01 ENCOUNTER — Telehealth: Admitting: Psychiatry
